# Patient Record
Sex: Male | Born: 1940 | Race: White | Hispanic: No | Marital: Married | Smoking: Never smoker
Health system: Southern US, Community
[De-identification: ages and names within clinical notes are randomized; demographics above are authoritative.]

## PROBLEM LIST (undated history)

## (undated) DIAGNOSIS — I639 Cerebral infarction, unspecified: Secondary | ICD-10-CM

## (undated) DIAGNOSIS — R1310 Dysphagia, unspecified: Secondary | ICD-10-CM

## (undated) DIAGNOSIS — F0390 Unspecified dementia without behavioral disturbance: Secondary | ICD-10-CM

## (undated) DIAGNOSIS — G20A1 Parkinson's disease without dyskinesia, without mention of fluctuations: Secondary | ICD-10-CM

## (undated) DIAGNOSIS — N390 Urinary tract infection, site not specified: Secondary | ICD-10-CM

## (undated) DIAGNOSIS — H9190 Unspecified hearing loss, unspecified ear: Secondary | ICD-10-CM

## (undated) DIAGNOSIS — T7840XA Allergy, unspecified, initial encounter: Secondary | ICD-10-CM

## (undated) HISTORY — PX: PARTIAL COLECTOMY: SHX5273

## (undated) HISTORY — PX: COCHLEAR IMPLANT: SUR684

---

## 2013-10-30 MED ORDER — IBUPROFEN 600 MG TAB
600 mg | ORAL_TABLET | Freq: Four times a day (QID) | ORAL | Status: AC | PRN
Start: 2013-10-30 — End: ?

## 2013-10-30 MED ORDER — HYDROCODONE-ACETAMINOPHEN 5 MG-325 MG TAB
5-325 mg | ORAL_TABLET | ORAL | Status: AC | PRN
Start: 2013-10-30 — End: ?

## 2013-10-30 MED ADMIN — HYDROcodone-acetaminophen (NORCO) 5-325 mg per tablet 1 Tab: ORAL | NDC 00591320205

## 2013-10-30 MED FILL — HYDROCODONE-ACETAMINOPHEN 5 MG-325 MG TAB: 5-325 mg | ORAL | Qty: 1

## 2013-10-30 NOTE — ED Notes (Signed)
Patient medicated for pain prior to xray. Resting with wife at bedside.

## 2013-10-30 NOTE — ED Notes (Signed)
Pt states that this am around 1130 he was cleaning windows and fell 4 ft. Hitting his left lower back and head.  States was slightly confused afterwards but recover quickly.  Pain is located in the left flank with redness and small abrasions.   Pt denies headache, nausea or blurred vision.

## 2013-10-30 NOTE — ED Notes (Signed)
Patient  given copy of dc instructions and 2 script(s).  Patient  verbalized understanding of instructions and script (s).  P  Patient alert and oriented and in no acute distress.  Patient discharged home ambulatory with wife.

## 2013-10-30 NOTE — ED Provider Notes (Signed)
HPI Comments: 73 yo WM presents after a fall. He was cleaning windows about 7 hours ago. He fell 4 feet onto dirt. He hit the back of his head. No LOC. No headache. No vomiting. Pt is ambulating normally. He hit the left lower back on the ground. Pt was complaining of left low back pain after the fall. Pain is throbbing in nature. Pain feels like muscle spasms. No radiation of pain. Twisting make the pain worse. No chest pain. No SOB. No abd pain. He denies headache now. No neck pain. He has several small scrapes since the fall, TD is UTD. Pt is on a baby aspirin, but no other blood thinners. No ETOH or tobacco use.    The history is provided by the patient and the spouse.        Past Medical History   Diagnosis Date   ??? Diverticulitis         History reviewed. No pertinent past surgical history.      History reviewed. No pertinent family history.     History     Social History   ??? Marital Status: MARRIED     Spouse Name: N/A     Number of Children: N/A   ??? Years of Education: N/A     Occupational History   ??? Not on file.     Social History Main Topics   ??? Smoking status: Never Smoker    ??? Smokeless tobacco: Not on file   ??? Alcohol Use: No   ??? Drug Use: Not on file   ??? Sexual Activity: Not on file     Other Topics Concern   ??? Not on file     Social History Narrative   ??? No narrative on file                  ALLERGIES: Pcn and Sulfa (sulfonamide antibiotics)      Review of Systems   Constitutional: Negative for fever and activity change.   Eyes: Negative for pain.   Respiratory: Negative for cough and shortness of breath.    Cardiovascular: Negative for chest pain and leg swelling.   Gastrointestinal: Negative for abdominal pain.   Endocrine: Negative for polyuria.   Genitourinary: Negative for hematuria and flank pain.   Musculoskeletal: Positive for back pain. Negative for gait problem, neck pain and neck stiffness.   Skin: Negative for color change.   Allergic/Immunologic: Negative for immunocompromised state.    Neurological: Negative for speech difficulty and headaches.   Hematological: Does not bruise/bleed easily.   Psychiatric/Behavioral: Negative for confusion.   All other systems reviewed and are negative.      Filed Vitals:    10/30/13 1915   BP: 142/84   Pulse: 89   Temp: 99.1 ??F (37.3 ??C)   Resp: 18   Height: 5\' 7"  (1.702 m)   Weight: 66.679 kg (147 lb)   SpO2: 96%            Physical Exam   Constitutional: He is oriented to person, place, and time. He appears well-developed and well-nourished. No distress.   Well appearing, nontoxic, NAD   HENT:   Head: Normocephalic and atraumatic.   Right Ear: External ear normal.   Left Ear: External ear normal.   Eyes: EOM are normal. Pupils are equal, round, and reactive to light.   Neck: Normal range of motion. Neck supple. No JVD present. No tracheal deviation present.   Cardiovascular: Normal rate, regular rhythm and  normal heart sounds.  Exam reveals no gallop and no friction rub.    No murmur heard.  Pulmonary/Chest: Effort normal and breath sounds normal. No stridor. No respiratory distress. He has no wheezes. He has no rales.   Abdominal: Soft. Bowel sounds are normal. He exhibits no distension. There is no tenderness. There is no rebound and no guarding.   Musculoskeletal: Normal range of motion. He exhibits tenderness. He exhibits no edema.   Mild pain in left lower lumbar region to palpation, no bruising or redness noted, no midline lumbar pain.   Neurological: He is alert and oriented to person, place, and time. He has normal reflexes. No cranial nerve deficit. Coordination normal.   Skin: Skin is warm and dry. No rash noted. He is not diaphoretic. No erythema.   Several small abrasions over right forearm and over bilateral lower legs, no lacerations   Psychiatric: He has a normal mood and affect. His behavior is normal. Judgment and thought content normal.   Nursing note and vitals reviewed.       MDM  Number of Diagnoses or Management Options  Diagnosis  management comments: Pt has mild pain in left lower lumbar region. Will give Norco. Will obtain xrays. No headache. He looks great otherwise. TD is UTD. DDx is sprain, contusion or fracture.       Amount and/or Complexity of Data Reviewed  Tests in the radiology section of CPT??: ordered and reviewed  Decide to obtain previous medical records or to obtain history from someone other than the patient: yes  Obtain history from someone other than the patient: yes (Wife)  Review and summarize past medical records: yes  Independent visualization of images, tracings, or specimens: yes    Risk of Complications, Morbidity, and/or Mortality  Presenting problems: moderate  Diagnostic procedures: moderate  Management options: moderate    Patient Progress  Patient progress: improved      Procedures    8:01 PM  Xrays of low back show no acute fracture    Home with Motrin, Norco, PCP follow up. Pt and his wife agree.    Apply ice to affected area 20 min on and 20 min off    Return for any worsening symptoms including chest pain, shortness of breath, vomiting or fevers. Drink plenty of fluids. Please call your primary doctor for follow up within the next 24 hours.

## 2015-08-20 ENCOUNTER — Ambulatory Visit: Admit: 2015-08-20 | Discharge: 2015-08-20 | Payer: MEDICARE | Attending: Nurse Practitioner | Primary: Family Medicine

## 2015-08-20 DIAGNOSIS — L814 Other melanin hyperpigmentation: Secondary | ICD-10-CM

## 2015-08-20 MED ORDER — FLUOROURACIL 5 % TOPICAL CREAM
5 % | Freq: Two times a day (BID) | CUTANEOUS | 0 refills | Status: AC
Start: 2015-08-20 — End: ?

## 2015-08-20 NOTE — Progress Notes (Signed)
Name: Terry Rios       Age: 75 y.o.       Date: 08/20/2015    Chief Complaint:   Chief Complaint   Patient presents with   ??? Skin Exam     New Patient- No concerns general preventative exam       Subjective:    HPI  [name] is a 75 y.o. male who presents as a new patient to [facility] for a skin exam.  The patient was referred for this evaluation by another patient. The patient has had a skin exam in the past and the patient does have current complaints related to his skin.  He reports scaling on his scalp. He reports cryotherapy in the past when he was living in [place]. For the last 2 years he has been seeing dermatology locally and did not have any treatments he states. He would like to transfer here.  [name] is feeling well and in his usual state of health today.    The patient's pertinent skin history includes: AK? - areas on the face, scalp treated with cryotherapy.    ROS: Constitutional: Negative.    Dermatological : positive for - skin lesion changes      Social History     Social History   ??? Marital status: UNKNOWN     Spouse name: N/A   ??? Number of children: N/A   ??? Years of education: N/A     Occupational History   ??? Not on file.     Social History Main Topics   ??? Smoking status: Never Smoker   ??? Smokeless tobacco: Never Used   ??? Alcohol use No   ??? Drug use: Not on file   ??? Sexual activity: Not on file     Other Topics Concern   ??? Not on file     Social History Narrative       No family history on file.    Past Medical History:   Diagnosis Date   ??? Diverticulitis    ??? Sun-damaged skin    ??? Sunburn, blistering        History reviewed. No pertinent surgical history.    Current Outpatient Prescriptions   Medication Sig Dispense Refill   ??? fluorouracil (EFUDEX) 5 % chemo cream Apply  to affected area two (2) times a day. 40 g 0   ??? aspirin 81 mg chewable tablet Take 81 mg by mouth daily.      ??? ibuprofen (MOTRIN) 600 mg tablet Take 1 Tab by mouth every six (6) hours as needed for Pain. 20 Tab 0   ??? HYDROcodone-acetaminophen (NORCO) 5-325 mg per tablet Take 1 Tab by mouth every four (4) hours as needed for Pain. Max Daily Amount: 6 Tabs. 15 Tab 0       Allergies   Allergen Reactions   ??? Pcn [Penicillins] Anaphylaxis   ??? Sulfa (Sulfonamide Antibiotics) Anaphylaxis         Objective:    Visit Vitals   ??? BP 138/82 (BP 1 Location: Left arm, BP Patient Position: Sitting)   ??? Pulse 78   ??? Temp 98.5 ??F (36.9 ??C) (Oral)   ??? Resp 19   ??? Ht  (1.702 m)   ??? Wt 66.7 kg (147 lb)   ??? SpO2 97%   ??? BMI 23.02 kg/m2       Terry Rios is a 75 y.o. male who  appears well and in no distress.  He is awake, alert, and oriented.  There is no preauricular, submandibular, or cervical lymphadenopathy.  A skin examination was performed including his scalp, face (including eyelid), ears, neck, chest, back, abdomen, upper extremities (including digits/nails), lower extremities, breasts, buttocks; genital skin was not examined.  He is heavily freckled. There are diffuse small thin scaled AKs and dry skin on the scalp, forehead and temples. There are scattered seborrheic keratoses. He has pink intradermal and pink and brown intradermal nevi without concerning features. There are scattered cherry angiomas. He has a few excoriations on the left anterior shin.        Assessment/Plan:  1. Solar lentigos.  The diagnosis and relationship to sun exposure was reviewed.  Sun protection advised.  2. Normal nevi.  The diagnosis of normal nevi was reviewed.  I discussed sun protection, sunscreen use, the warning signs of skin cancer, the need for self-skin examinations, and the need for regular practitioner exams every 1 year.  The patient should follow up sooner as needed if new, changing, or symptomatic skin lesions arise.  3. Seborrheic keratoses.  The diagnosis was reviewed and the patient was  reassured that no treatment is needed for these benign lesions.  4. Cherry angiomas.  The diagnosis was reviewed and the patient was reassured that no treatment is needed for these benign lesions.  5. Actinic Keratoses.  The diagnosis of this precancerous lesion related to sun exposure was reviewed.  I suggest 5-Fu vs PDT for his scalp. We discussed both options. He would like to do 5-Fu. We discussed use of the medication and side effects. Handouts provided.

## 2015-11-18 ENCOUNTER — Encounter: Attending: Nurse Practitioner | Primary: Family Medicine

## 2019-07-18 ENCOUNTER — Ambulatory Visit: Payer: Medicare Other | Attending: Internal Medicine

## 2019-07-18 DIAGNOSIS — Z23 Encounter for immunization: Secondary | ICD-10-CM | POA: Insufficient documentation

## 2019-07-18 NOTE — Progress Notes (Signed)
   Covid-19 Vaccination Clinic  Name:  Justin Bird    MRN: [id number] DOB: [birth year]  07/18/2019  Justin Bird was observed post Covid-19 immunization for 15 minutes without incidence. He was provided with Vaccine Information Sheet and instruction to access the V-Safe system.   Justin Bird was instructed to call 911 with any severe reactions post vaccine: Marland Kitchen Difficulty breathing  . Swelling of your face and throat  . A fast heartbeat  . A bad rash all over your body  . Dizziness and weakness    Immunizations Administered    Name Date Dose VIS Date Route   Pfizer COVID-19 Vaccine 07/18/2019 12:50 PM 0.3 mL 06/07/2019 Intramuscular   Manufacturer: [place]   Lot: [id number]   [place]: [id number]

## 2019-08-06 ENCOUNTER — Ambulatory Visit: Payer: Medicare Other | Attending: Internal Medicine

## 2019-08-06 DIAGNOSIS — Z23 Encounter for immunization: Secondary | ICD-10-CM | POA: Insufficient documentation

## 2019-08-06 NOTE — Progress Notes (Signed)
   Covid-19 Vaccination Clinic  Name:  Justin Bird    MRN: [id number] DOB: October 21, 1940  08/06/2019  Justin Bird was observed post Covid-19 immunization for 30 minutes based on pre-vaccination screening without incidence. He was provided with Vaccine Information Sheet and instruction to access the V-Safe system.   Justin Bird was instructed to call 911 with any severe reactions post vaccine: Marland Kitchen Difficulty breathing  . Swelling of your face and throat  . A fast heartbeat  . A bad rash all over your body  . Dizziness and weakness    Immunizations Administered    Name Date Dose VIS Date Route   Pfizer COVID-19 Vaccine 08/06/2019 12:33 PM 0.3 mL 06/07/2019 Intramuscular   Manufacturer: [place]   Lot: [id number]   [place]: [id number]

## 2019-10-29 ENCOUNTER — Other Ambulatory Visit: Payer: Self-pay

## 2019-10-29 ENCOUNTER — Ambulatory Visit
Admission: EM | Admit: 2019-10-29 | Discharge: 2019-10-29 | Disposition: A | Discharge status: Home / Self Care | Payer: Medicare Other

## 2019-10-29 DIAGNOSIS — H9201 Otalgia, right ear: Secondary | ICD-10-CM | POA: Diagnosis not present

## 2019-10-29 DIAGNOSIS — T161XXA Foreign body in right ear, initial encounter: Secondary | ICD-10-CM

## 2019-10-29 DIAGNOSIS — R030 Elevated blood-pressure reading, without diagnosis of hypertension: Secondary | ICD-10-CM

## 2019-10-29 HISTORY — DX: Unspecified hearing loss, unspecified ear: H91.90

## 2019-10-29 NOTE — ED Triage Notes (Signed)
Pt presents with c/o possible foreign body in his right ear. Pt reports he went to remove his hearing aid last night and the rubber tip of the hearing aid did not come out. Pt thinks it may be stuck in his ear canal. Pt denies any pain in the ear. Pt states this has happened previously and he was able to remove the piece, but has not attempted to this time for fear of injuring his ear.

## 2019-10-29 NOTE — ED Provider Notes (Signed)
Roderic Palau    CSN: [id number] Arrival date & time: 10/29/19  0831      History   Chief Complaint Chief Complaint  Patient presents with  . Ear Problem    poss foreign body, right    HPI Justin Bird is a 79 y.o. male.   Accompanied by his wife, patient presents with a foreign body in his right ear canal since yesterday evening.  He states part of his hearing aid broke off into his ear while he was removing it yesterday evening.  He states it is uncomfortable but not painful.  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    The history is provided by the patient and the spouse.    Past Medical History:  Diagnosis Date  . Hearing deficit     There are no problems to display for this patient.   Past Surgical History:  Procedure Laterality Date  . COCHLEAR IMPLANT Left   . PARTIAL COLECTOMY         Home Medications    Prior to Admission medications   Medication Sig Start Date End Date Taking? Authorizing Provider  DIPHENHYDRAMINE HCL PO Take by mouth.    [provider]  GLUCOSAMINE SULFATE PO Take by mouth.    [provider]  Multiple Vitamins-Minerals (VITAMIN D3 COMPLETE PO) Take by mouth.    [provider]    Family History History reviewed. No pertinent family history.  Social History Social History   Tobacco Use  . Smoking status: Never Smoker  . Smokeless tobacco: Never Used  Substance Use Topics  . Alcohol use: Not Currently  . Drug use: Never     Allergies   Opium poppy [papaver], Penicillins, and Sulfa antibiotics   Review of Systems Review of Systems  Constitutional: Negative for chills and fever.  HENT: Positive for hearing loss. Negative for ear pain and sore throat.   Eyes: Negative for pain and visual disturbance.  Respiratory: Negative for cough and shortness of breath.   Cardiovascular: Negative for chest pain and palpitations.  Gastrointestinal: Negative for  abdominal pain and vomiting.  Genitourinary: Negative for dysuria and hematuria.  Musculoskeletal: Negative for arthralgias and back pain.  Skin: Negative for color change and rash.  Neurological: Negative for seizures and syncope.  All other systems reviewed and are negative.    Physical Exam Triage Vital Signs ED Triage Vitals [10/29/19 0838]  Enc Vitals Group     BP      Pulse      Resp      Temp      Temp src      SpO2      Weight 170 lb (77.1 kg)     Height 5\' 6"  (1.676 m)     Head Circumference      Peak Flow      Pain Score 0     Pain Loc      Pain Edu?      Excl. in [place]?    No data found.  Updated Vital Signs BP (!) 151/87 (BP Location: Right Arm)   Pulse 81   Temp 98.2 F (36.8 C) (Oral)   Ht 5\' 6"  (1.676 m)   Wt 170 lb (77.1 kg)   SpO2 96%   BMI 27.44 kg/m   Visual Acuity Right Eye Distance:   Left Eye Distance:   Bilateral Distance:    Right Eye Near:   Left Eye  Near:    Bilateral Near:     Physical Exam Vitals and nursing note reviewed.  Constitutional:      Appearance: He is well-developed.  HENT:     Head: Normocephalic and atraumatic.     Right Ear: A foreign body is present.     Nose: Nose normal.     Mouth/Throat:     Mouth: Mucous membranes are moist.     Pharynx: Oropharynx is clear.  Eyes:     Conjunctiva/sclera: Conjunctivae normal.  Cardiovascular:     Rate and Rhythm: Normal rate and regular rhythm.     Heart sounds: No murmur.  Pulmonary:     Effort: Pulmonary effort is normal. No respiratory distress.     Breath sounds: Normal breath sounds.  Abdominal:     Palpations: Abdomen is soft.     Tenderness: There is no abdominal tenderness. There is no guarding or rebound.  Musculoskeletal:     Cervical back: Neck supple.  Skin:    General: Skin is warm and dry.     Findings: No rash.  Neurological:     General: No focal deficit present.     Mental Status: He is alert and oriented to person, place, and time.     Gait:  Gait normal.  Psychiatric:        Mood and Affect: Mood normal.        Behavior: Behavior normal.      UC Treatments / Results  Labs (all labs ordered are listed, but only abnormal results are displayed) Labs Reviewed - No data to display  EKG   Radiology No results found.  Procedures Foreign Body Removal  Date/Time: 10/29/2019 9:51 AM Performed by: Sharion Balloon, NP Authorized by: Sharion Balloon, NP   Consent:    Consent obtained:  Verbal   Consent given by:  Patient   Risks discussed:  Bleeding, pain and incomplete removal Location:    Location:  Ear   Ear location:  R ear Anesthesia (see MAR for exact dosages):    Anesthesia method:  None Procedure details:    Intact foreign body removal: yes   Post-procedure details:    Confirmation:  No additional foreign bodies on visualization   Patient tolerance of procedure:  Tolerated well, no immediate complications Comments:     Rubber tip of hearing aid removed intact from right ear canal.     (including critical care time)  Medications Ordered in UC Medications - No data to display  Initial Impression / Assessment and Plan / UC Course  I have reviewed the triage vital signs and the nursing notes.  Pertinent labs & imaging results that were available during my care of the patient were reviewed by me and considered in my medical decision making (see chart for details).   Foreign body of right ear.  Elevated blood pressure.  Hearing aid tip removed from right ear canal.  Instructed patient to follow-up with his PCP as needed.  Discussed with patient that his blood pressure is elevated today and this needs to be rechecked by his PCP in 2 to 4 weeks.  Patient and his wife agree to plan of care.   Final Clinical Impressions(s) / UC Diagnoses   Final diagnoses:  Foreign body of right ear, initial encounter  Elevated blood pressure reading     Discharge Instructions     The foreign body (piece of hearing aid) was  removed from your ear.  Follow up with your  PCP as needed.    Your blood pressure is elevated today at 151/87.  Please have this rechecked by your primary care provider in 2-4 weeks.         ED Prescriptions    None     PDMP not reviewed this encounter.   Sharion Balloon, NP 10/29/19 [phone]

## 2019-10-29 NOTE — Discharge Instructions (Addendum)
The foreign body (piece of hearing aid) was removed from your ear.  Follow up with your PCP as needed.    Your blood pressure is elevated today at 151/87.  Please have this rechecked by your primary care provider in 2-4 weeks.

## 2019-12-25 ENCOUNTER — Ambulatory Visit: Payer: Medicare Other | Admitting: Dermatology

## 2019-12-25 ENCOUNTER — Other Ambulatory Visit: Payer: Self-pay

## 2019-12-25 DIAGNOSIS — D229 Melanocytic nevi, unspecified: Secondary | ICD-10-CM

## 2019-12-25 DIAGNOSIS — D1800 Hemangioma unspecified site: Secondary | ICD-10-CM

## 2019-12-25 DIAGNOSIS — L814 Other melanin hyperpigmentation: Secondary | ICD-10-CM | POA: Diagnosis not present

## 2019-12-25 DIAGNOSIS — Z1283 Encounter for screening for malignant neoplasm of skin: Secondary | ICD-10-CM | POA: Diagnosis not present

## 2019-12-25 DIAGNOSIS — L821 Other seborrheic keratosis: Secondary | ICD-10-CM

## 2019-12-25 DIAGNOSIS — L570 Actinic keratosis: Secondary | ICD-10-CM | POA: Diagnosis not present

## 2019-12-25 DIAGNOSIS — L578 Other skin changes due to chronic exposure to nonionizing radiation: Secondary | ICD-10-CM

## 2019-12-25 NOTE — Progress Notes (Signed)
   New Patient Visit  Subjective  Justin Bird is a 79 y.o. male who presents for the following: Annual Exam (patient recently moved to this area from [place] and would like to establish care. He has no known history of skin cancers or irregular nevi, but he has had a history of AK's). The patient presents for Total-Body Skin Exam (TBSE) for skin cancer screening and mole check.  The following portions of the chart were reviewed this encounter and updated as appropriate:  Tobacco  Allergies  Meds  Problems  Med Hx  Surg Hx  Fam Hx     Review of Systems:  No other skin or systemic complaints except as noted in HPI or Assessment and Plan.  Objective  Well appearing patient in no apparent distress; mood and affect are within normal limits.  A full examination was performed including scalp, head, eyes, ears, nose, lips, neck, chest, axillae, abdomen, back, buttocks, bilateral upper extremities, bilateral lower extremities, hands, feet, fingers, toes, fingernails, and toenails. All findings within normal limits unless otherwise noted below.  Objective  scalp, face, and ears (15): Erythematous thin papules/macules with gritty scale.   Assessment & Plan    AK (actinic keratosis) (15) scalp, face, and ears  Consider PDT for the scalp at follow up appointment. Pamphlet given.   Destruction of lesion - scalp, face, and ears Complexity: simple   Destruction method: cryotherapy   Informed consent: discussed and consent obtained   Timeout:  patient name, date of birth, surgical site, and procedure verified Lesion destroyed using liquid nitrogen: Yes   Region frozen until ice ball extended beyond lesion: Yes   Outcome: patient tolerated procedure well with no complications   Post-procedure details: wound care instructions given    Skin cancer screening   Lentigines - Scattered tan macules - Discussed due to sun exposure - Benign, observe - Call for any changes  Seborrheic  Keratoses - Stuck-on, waxy, tan-brown papules and plaques  - Discussed benign etiology and prognosis. - Observe - Call for any changes  Melanocytic Nevi - Tan-brown and/or pink-flesh-colored symmetric macules and papules - Benign appearing on exam today - Observation - Call clinic for new or changing moles - Recommend daily use of broad spectrum spf 30+ sunscreen to sun-exposed areas.   Hemangiomas - Red papules - Discussed benign nature - Observe - Call for any changes  Actinic Damage - diffuse scaly erythematous macules with underlying dyspigmentation - Recommend daily broad spectrum sunscreen SPF 30+ to sun-exposed areas, reapply every 2 hours as needed.  - Call for new or changing lesions.  Skin cancer screening performed today.   Return in about 3 months (around 03/26/2020) for AK recheck .  Luther Redo, CMA, am acting as scribe for Sarina Ser, MD .  Documentation: I have reviewed the above documentation for accuracy and completeness, and I agree with the above.  Sarina Ser, MD

## 2020-01-06 ENCOUNTER — Encounter: Payer: Self-pay | Admitting: Dermatology

## 2020-03-26 ENCOUNTER — Ambulatory Visit: Payer: Medicare Other | Admitting: Dermatology

## 2020-06-01 ENCOUNTER — Other Ambulatory Visit: Payer: Self-pay | Admitting: Student

## 2020-06-01 DIAGNOSIS — M47816 Spondylosis without myelopathy or radiculopathy, lumbar region: Secondary | ICD-10-CM

## 2020-06-09 ENCOUNTER — Ambulatory Visit: Payer: Medicare Other | Admitting: Dermatology

## 2020-06-11 ENCOUNTER — Ambulatory Visit
Admission: RE | Admit: 2020-06-11 | Discharge: 2020-06-11 | Disposition: A | Discharge status: Home / Self Care | Payer: Medicare Other | Source: Ambulatory Visit | Attending: Student | Admitting: Student

## 2020-06-11 ENCOUNTER — Other Ambulatory Visit: Payer: Self-pay

## 2020-06-11 DIAGNOSIS — M47816 Spondylosis without myelopathy or radiculopathy, lumbar region: Secondary | ICD-10-CM

## 2020-06-12 ENCOUNTER — Other Ambulatory Visit: Payer: Self-pay | Admitting: Student

## 2020-06-12 DIAGNOSIS — M47816 Spondylosis without myelopathy or radiculopathy, lumbar region: Secondary | ICD-10-CM

## 2020-06-22 ENCOUNTER — Other Ambulatory Visit: Payer: Self-pay

## 2020-06-22 ENCOUNTER — Ambulatory Visit
Admission: RE | Admit: 2020-06-22 | Discharge: 2020-06-22 | Disposition: A | Discharge status: Home / Self Care | Payer: Medicare Other | Source: Ambulatory Visit | Attending: Student | Admitting: Student

## 2020-06-22 DIAGNOSIS — M47816 Spondylosis without myelopathy or radiculopathy, lumbar region: Secondary | ICD-10-CM

## 2021-04-26 ENCOUNTER — Other Ambulatory Visit: Payer: Self-pay

## 2021-04-26 ENCOUNTER — Ambulatory Visit: Payer: Medicare Other | Admitting: Dermatology

## 2021-04-26 DIAGNOSIS — B078 Other viral warts: Secondary | ICD-10-CM

## 2021-04-26 DIAGNOSIS — L570 Actinic keratosis: Secondary | ICD-10-CM | POA: Diagnosis not present

## 2021-04-26 DIAGNOSIS — L820 Inflamed seborrheic keratosis: Secondary | ICD-10-CM | POA: Diagnosis not present

## 2021-04-26 DIAGNOSIS — D692 Other nonthrombocytopenic purpura: Secondary | ICD-10-CM | POA: Diagnosis not present

## 2021-04-26 DIAGNOSIS — L578 Other skin changes due to chronic exposure to nonionizing radiation: Secondary | ICD-10-CM | POA: Diagnosis not present

## 2021-04-26 NOTE — Patient Instructions (Addendum)

## 2021-04-26 NOTE — Progress Notes (Signed)
Follow-Up Visit   Subjective  Justin Bird is a 80 y.o. male who presents for the following: Actinic Keratosis (4 months f/u hx of precancers on his face). Check several rough places on his face and scalp today.  The following portions of the chart were reviewed this encounter and updated as appropriate:   Tobacco  Allergies  Meds  Problems  Med Hx  Surg Hx  Fam Hx     Review of Systems:  No other skin or systemic complaints except as noted in HPI or Assessment and Plan.  Objective  Well appearing patient in no apparent distress; mood and affect are within normal limits.  A focused examination was performed including face,scalp. Relevant physical exam findings are noted in the Assessment and Plan.  face,scalp  (17) (17) Erythematous thin papules/macules with gritty scale.   right temple  (1) Erythematous keratotic or waxy stuck-on papule or plaque.   left index finger (1) Verrucous papules -- Discussed viral etiology and contagion.    Assessment & Plan  AK (actinic keratosis) (17) face,scalp  (17)  Destruction of lesion - face,scalp  (17) Complexity: simple   Destruction method: cryotherapy   Informed consent: discussed and consent obtained   Timeout:  patient name, date of birth, surgical site, and procedure verified Lesion destroyed using liquid nitrogen: Yes   Region frozen until ice ball extended beyond lesion: Yes   Outcome: patient tolerated procedure well with no complications   Post-procedure details: wound care instructions given    Inflamed seborrheic keratosis right temple  (1)  Reassured benign age-related growth.  Recommend observation.  Discussed cryotherapy if spot(s) become irritated or inflamed.   Destruction of lesion - right temple  (1) Complexity: simple   Destruction method: cryotherapy   Informed consent: discussed and consent obtained   Timeout:  patient name, date of birth, surgical site, and procedure verified Lesion destroyed  using liquid nitrogen: Yes   Region frozen until ice ball extended beyond lesion: Yes   Outcome: patient tolerated procedure well with no complications   Post-procedure details: wound care instructions given    Other viral warts left index finger (1)  Discussed viral etiology and risk of spread.  Discussed multiple treatments may be required to clear warts.  Discussed possible post-treatment dyspigmentation and risk of recurrence.   Destruction of lesion - left index finger (1) Complexity: simple   Destruction method: cryotherapy   Informed consent: discussed and consent obtained   Timeout:  patient name, date of birth, surgical site, and procedure verified Lesion destroyed using liquid nitrogen: Yes   Region frozen until ice ball extended beyond lesion: Yes   Outcome: patient tolerated procedure well with no complications   Post-procedure details: wound care instructions given    Actinic Damage - Severe, confluent actinic changes with pre-cancerous actinic keratoses  - Severe, chronic, not at goal, secondary to cumulative UV radiation exposure over time - diffuse scaly erythematous macules and papules with underlying dyspigmentation - Discussed Prescription "Field Treatment" for Severe, Chronic Confluent Actinic Changes with Pre-Cancerous Actinic Keratoses  Plan field treatment at next visit.  Field treatment involves treatment of an entire area of skin that has confluent Actinic Changes (Sun/ Ultraviolet light damage) and PreCancerous Actinic Keratoses by method of PhotoDynamic Therapy (PDT) and/or prescription Topical Chemotherapy agents such as 5-fluorouracil, 5-fluorouracil/calcipotriene, and/or imiquimod.  The purpose is to decrease the number of clinically evident and subclinical PreCancerous lesions to prevent progression to development of skin cancer by chemically destroying early precancer  changes that may or may not be visible.  It has been shown to reduce the risk of developing  skin cancer in the treated area. As a result of treatment, redness, scaling, crusting, and open sores may occur during treatment course. One or more than one of these methods may be used and may have to be used several times to control, suppress and eliminate the PreCancerous changes. Discussed treatment course, expected reaction, and possible side effects. - Recommend daily broad spectrum sunscreen SPF 30+ to sun-exposed areas, reapply every 2 hours as needed.  - Staying in the shade or wearing long sleeves, sun glasses (UVA+UVB protection) and wide brim hats (4-inch brim around the entire circumference of the hat) are also recommended. - Call for new or changing lesions.   Purpura - Chronic; persistent and recurrent.  Treatable, but not curable. - Violaceous macules and patches - Benign - Related to trauma, age, sun damage and/or use of blood thinners, chronic use of topical and/or oral steroids - Observe - Can use OTC arnica containing moisturizer such as Dermend Bruise Formula if desired - Call for worsening or other concerns   Return in about 5 months (around 09/24/2021) for Aks .  IMarye Round, CMA, am acting as scribe for Sarina Ser, MD .  Documentation: I have reviewed the above documentation for accuracy and completeness, and I agree with the above.  Sarina Ser, MD

## 2021-04-27 ENCOUNTER — Encounter: Payer: Self-pay | Admitting: Dermatology

## 2021-06-30 IMAGING — CT CT L SPINE W/O CM
3 of 4 series · 12 of 33 positions shown, 14 images · non-contrast
Comparison: None.

CLINICAL DATA: Low back pain.  No injury

EXAM:
CT LUMBAR SPINE WITHOUT CONTRAST
TECHNIQUE: Multidetector CT imaging of the lumbar spine was performed without
intravenous contrast administration. Multiplanar CT image
reconstructions were also generated.

[Series 4: l spine soft · axial · 0.28mm/px · z∈[-354,-214]mm · 4 of 102 slices shown, 5 images]
[im 16/102  soft-tissue]
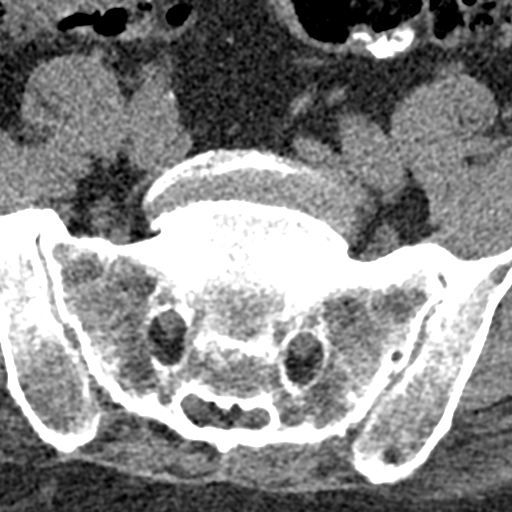
[im 16/102  bone]
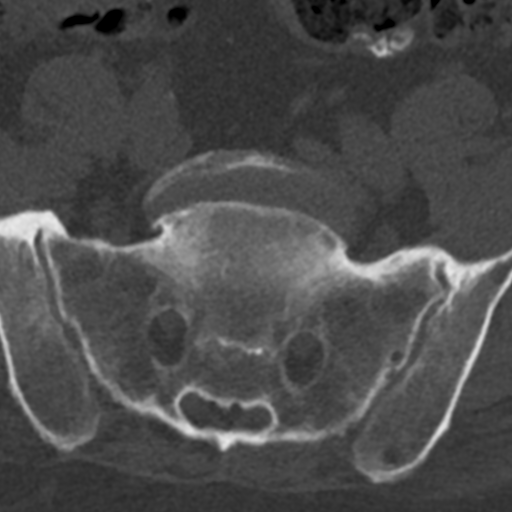
[im 39/102  bone]
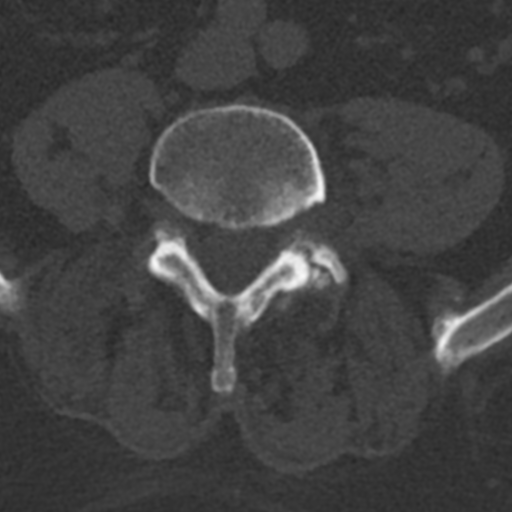
[im 63/102  bone]
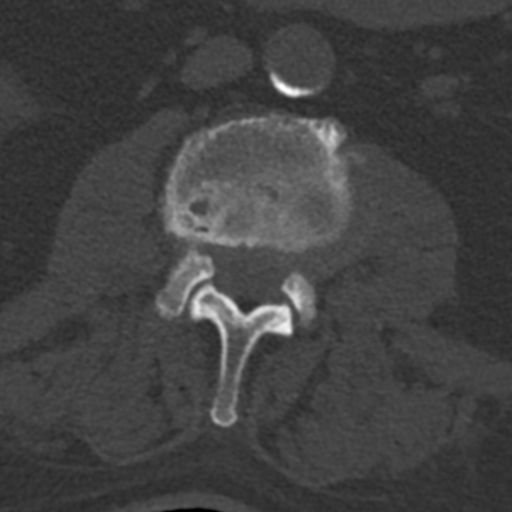
[im 86/102  bone]
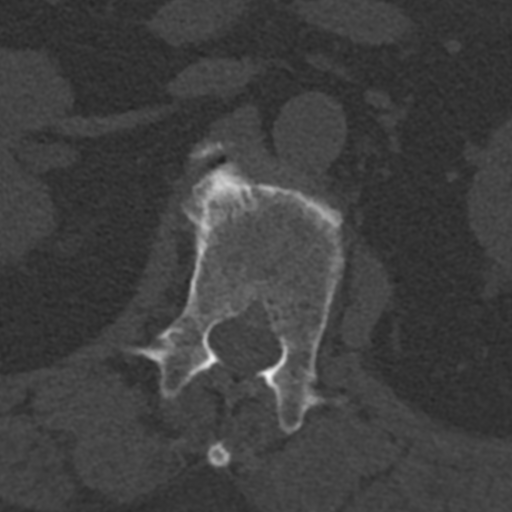

[Series 7: sagittal bone · sagittal · 0.30mm/px · 5 of 61 slices shown, 6 images]
[im 21/61  bone]
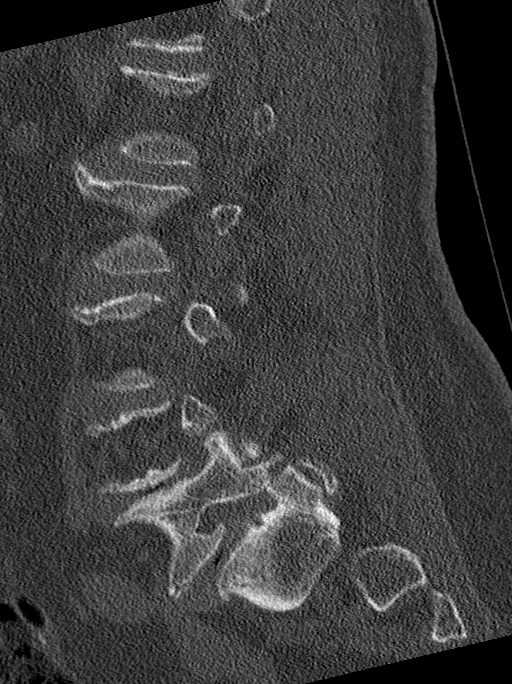
[im 26/61  bone]
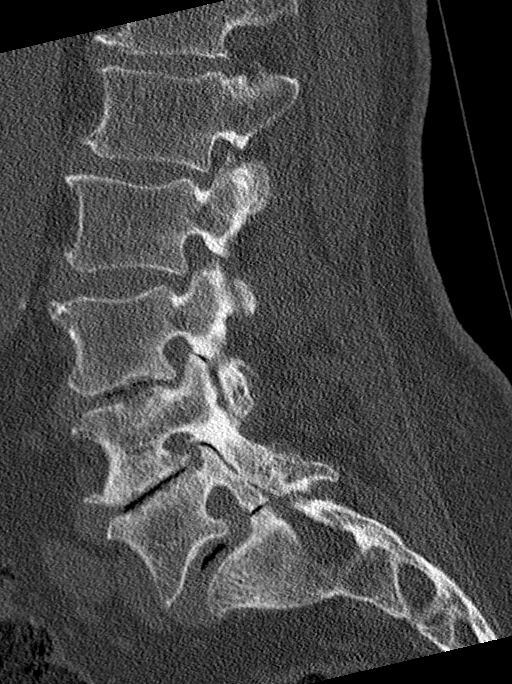
[im 31/61  soft-tissue]
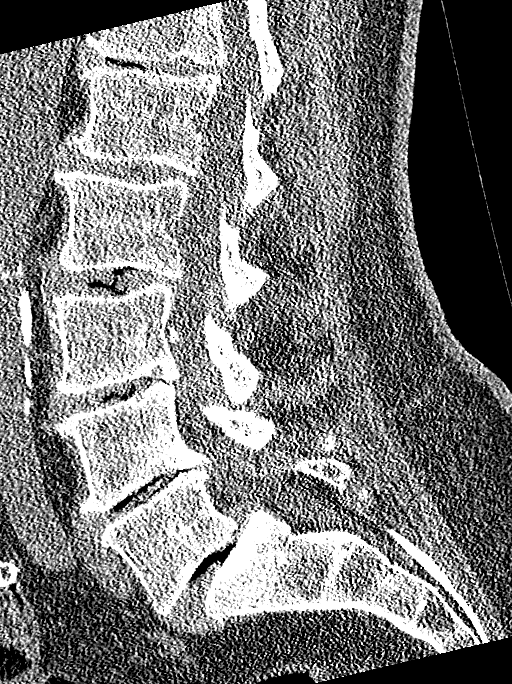
[im 31/61  bone]
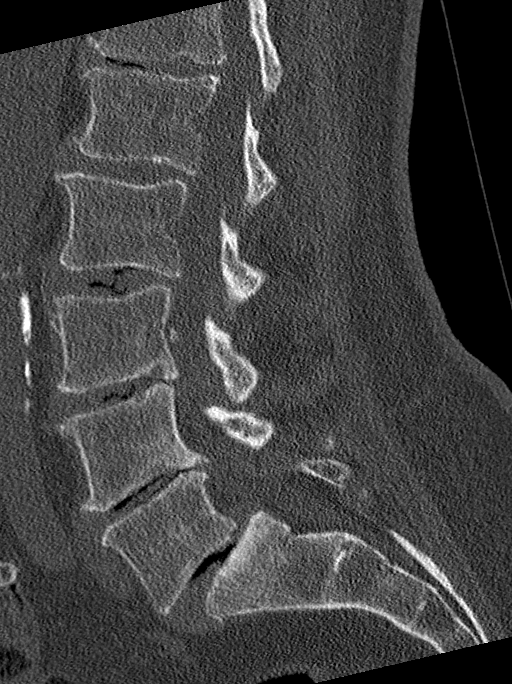
[im 36/61  bone]
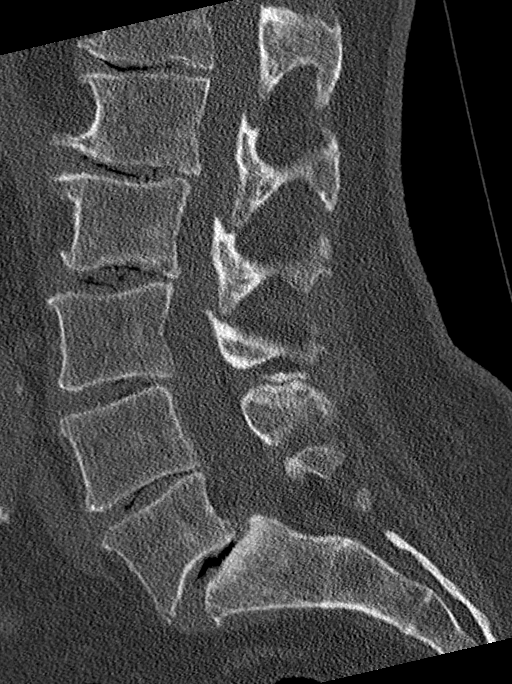
[im 41/61  bone]
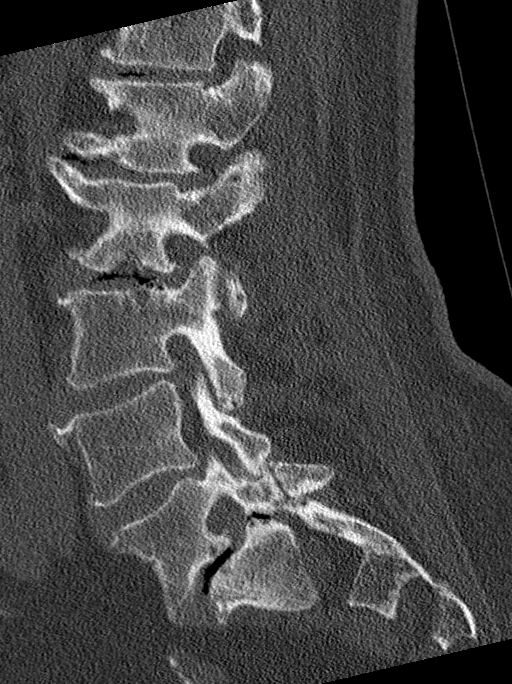

[Series 8: coronal bone · coronal · 0.23mm/px · 3 of 77 slices shown]
[im 20/77  bone]
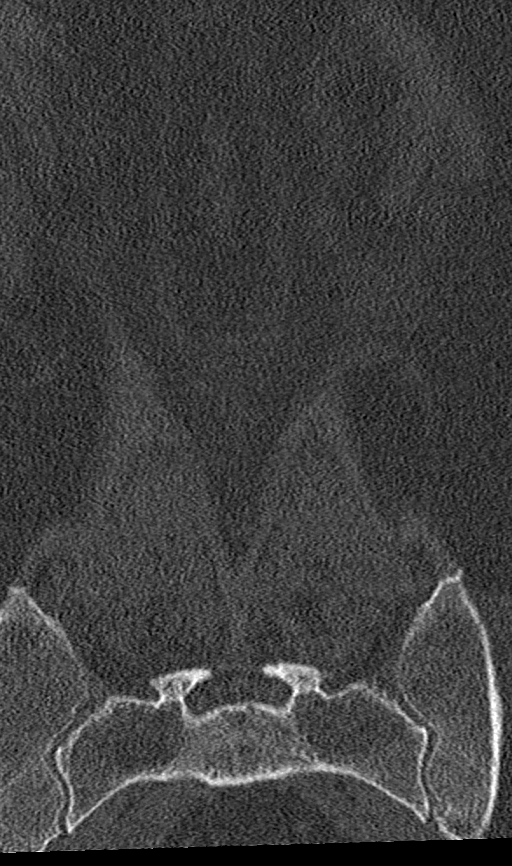
[im 39/77  bone]
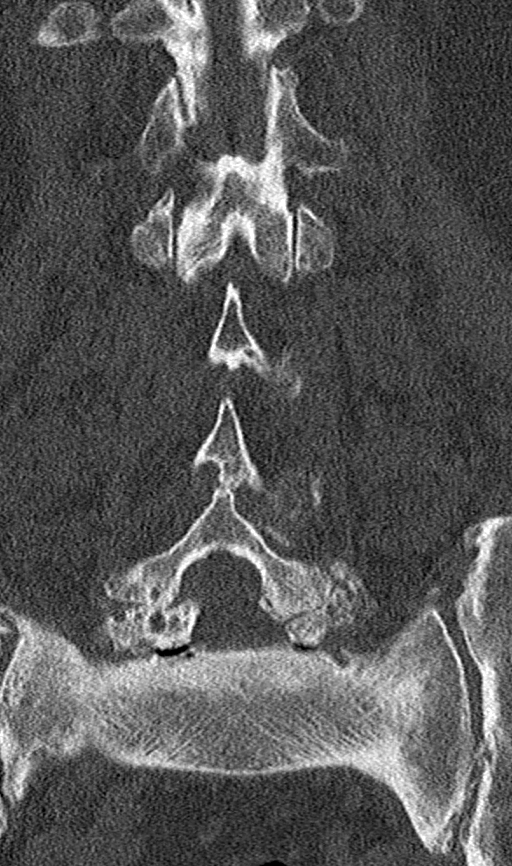
[im 58/77  bone]
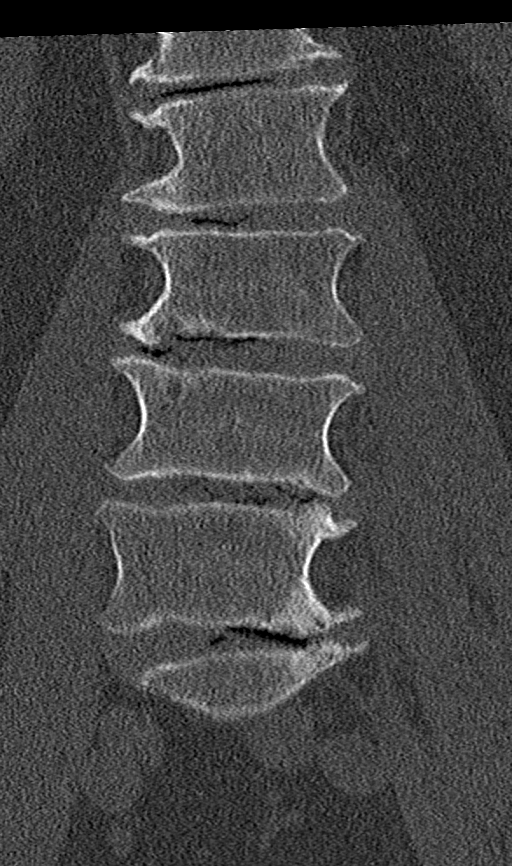

[12 of 33 positions shown; findings below may reference images not displayed]

FINDINGS: Segmentation: Normal

Alignment: Mild lumbar scoliosis. Slight retrolisthesis L1-2, L2-3,
L3-4. 6 mm anterolisthesis L5-S1.

Vertebrae: Chronic bilateral pars defects of L5. No vertebral body
fracture or mass.

Paraspinal and other soft tissues: Negative for paraspinous mass or
adenopathy. Mild atherosclerotic calcification abdominal aorta
without aneurysm.

Disc levels: T12-L1: Mild disc bulging and facet degeneration.
Negative for stenosis

L1-2: Disc degeneration with disc bulging and diffuse endplate
spurring. Mild facet degeneration. Mild to moderate subarticular
stenosis bilaterally due to spurring

L2-3: Diffuse disc bulging and endplate spurring. Mild disc bulging.
Mild spinal stenosis. Mild to moderate subarticular stenosis
bilaterally

L3-4: Disc degeneration with diffuse disc bulging and mild endplate
spurring. Mild subarticular stenosis bilaterally.

L4-5: Moderate disc degeneration with disc space narrowing and
endplate spurring, asymmetric to the left. Moderate facet
degeneration bilaterally. Moderate subarticular and foraminal
stenosis on the left. Mild subarticular stenosis on the right

L5-S1: Chronic bilateral pars defects of L5. Mild foraminal
narrowing bilaterally. Disc degeneration with disc space narrowing
and disc bulging.
IMPRESSION: Multilevel disc and facet degeneration throughout the lumbar spine.
Spinal and foraminal stenosis as described above

6 mm anterolisthesis L5-S1 with chronic bilateral pars defects of
L5.

## 2021-09-20 ENCOUNTER — Ambulatory Visit: Payer: Medicare Other | Admitting: Student in an Organized Health Care Education/Training Program

## 2021-09-27 ENCOUNTER — Ambulatory Visit: Payer: Medicare Other | Admitting: Dermatology

## 2023-01-25 ENCOUNTER — Other Ambulatory Visit: Payer: Self-pay | Admitting: Physical Medicine & Rehabilitation

## 2023-01-25 DIAGNOSIS — G8929 Other chronic pain: Secondary | ICD-10-CM

## 2023-02-02 ENCOUNTER — Ambulatory Visit
Admission: RE | Admit: 2023-02-02 | Discharge: 2023-02-02 | Disposition: A | Discharge status: Home / Self Care | Payer: Medicare Other | Source: Ambulatory Visit | Attending: Physical Medicine & Rehabilitation | Admitting: Physical Medicine & Rehabilitation

## 2023-02-02 DIAGNOSIS — G8929 Other chronic pain: Secondary | ICD-10-CM

## 2023-03-26 ENCOUNTER — Other Ambulatory Visit: Payer: Self-pay

## 2023-03-26 ENCOUNTER — Emergency Department: Payer: Medicare Other

## 2023-03-26 ENCOUNTER — Emergency Department
Admission: EM | Admit: 2023-03-26 | Discharge: 2023-03-26 | Disposition: A | Discharge status: Home / Self Care | Payer: Medicare Other | Attending: Emergency Medicine | Admitting: Emergency Medicine

## 2023-03-26 DIAGNOSIS — G8929 Other chronic pain: Secondary | ICD-10-CM | POA: Diagnosis not present

## 2023-03-26 DIAGNOSIS — M545 Low back pain, unspecified: Secondary | ICD-10-CM | POA: Diagnosis present

## 2023-03-26 LAB — COMPREHENSIVE METABOLIC PANEL
ALT: 19 U/L (ref 0–44)
AST: 19 U/L (ref 15–41)
Albumin: 4.4 g/dL (ref 3.5–5.0)
Alkaline Phosphatase: 51 U/L (ref 38–126)
Anion gap: 9 (ref 5–15)
BUN: 12 mg/dL (ref 8–23)
CO2: 26 mmol/L (ref 22–32)
Calcium: 9.1 mg/dL (ref 8.9–10.3)
Chloride: 104 mmol/L (ref 98–111)
Creatinine, Ser: 0.55 mg/dL — ABNORMAL LOW (ref 0.61–1.24)
GFR, Estimated: >60 mL/min (ref >60)
Glucose, Bld: 102 mg/dL — ABNORMAL HIGH (ref 70–99)
Potassium: 3.8 mmol/L (ref 3.5–5.1)
Sodium: 139 mmol/L (ref 135–145)
Total Bilirubin: 0.9 mg/dL (ref 0.3–1.2)
Total Protein: 7.9 g/dL (ref 6.5–8.1)

## 2023-03-26 LAB — CBC WITH DIFFERENTIAL/PLATELET
Abs Immature Granulocytes: 0.02 10*3/uL (ref 0.00–0.07)
Basophils Absolute: 0 10*3/uL (ref 0.0–0.1)
Basophils Relative: 0 %
Eosinophils Absolute: 0.1 10*3/uL (ref 0.0–0.5)
Eosinophils Relative: 1 %
HCT: 47.2 % (ref 39.0–52.0)
Hemoglobin: 15.9 g/dL (ref 13.0–17.0)
Immature Granulocytes: 0 %
Lymphocytes Relative: 18 %
Lymphs Abs: 1.4 10*3/uL (ref 0.7–4.0)
MCH: 29.8 pg (ref 26.0–34.0)
MCHC: 33.7 g/dL (ref 30.0–36.0)
MCV: 88.6 fL (ref 80.0–100.0)
Monocytes Absolute: 0.5 10*3/uL (ref 0.1–1.0)
Monocytes Relative: 7 %
Neutro Abs: 5.6 10*3/uL (ref 1.7–7.7)
Neutrophils Relative %: 74 %
Platelets: 267 10*3/uL (ref 150–400)
RBC: 5.33 MIL/uL (ref 4.22–5.81)
RDW: 12.3 % (ref 11.5–15.5)
WBC: 7.6 10*3/uL (ref 4.0–10.5)
nRBC: 0 % (ref 0.0–0.2)

## 2023-03-26 LAB — URINALYSIS, ROUTINE W REFLEX MICROSCOPIC
Bilirubin Urine: NEGATIVE
Glucose, UA: NEGATIVE mg/dL
Hgb urine dipstick: NEGATIVE
Ketones, ur: NEGATIVE mg/dL
Leukocytes,Ua: NEGATIVE
Nitrite: NEGATIVE
Protein, ur: NEGATIVE mg/dL
Specific Gravity, Urine: 1.009 (ref 1.005–1.030)
pH: 8 (ref 5.0–8.0)

## 2023-03-26 LAB — AMMONIA: Ammonia: 16 umol/L (ref 9–35)

## 2023-03-26 MED ORDER — OXYCODONE HCL 5 MG PO TABS
5.0000 mg | ORAL_TABLET | Freq: Three times a day (TID) | ORAL | 0 refills | Status: DC | PRN
Start: 2023-03-26 — End: 2023-05-11

## 2023-03-26 NOTE — ED Notes (Signed)
Pt A&O x4, no obvious distress noted, respirations regular/unlabored. Pt verbalizes understanding of discharge instructions. Pt able to ambulate from ED independently.   

## 2023-03-26 NOTE — ED Provider Notes (Signed)
[facility] Provider Note    Event Date/Time   First MD Initiated Contact with Patient 03/26/23 1134     (approximate)   History   Back Pain   HPI Justin Bird is a 82 y.o. male with history of chronic low back pain presenting today for the same as well as concern for possible confusion.  Patient is here with daughter who states he lives alone and unsure if he has had any recent falls but has been complaining of low back pain.  He gets around with a walker.  She also feels as if he has had intermittent periods where he has confusion although that is not noted at this time.  The symptoms have been going on over the past 2 to 3 weeks.  Patient himself is able to state that he has some low back pain but denies pain anywhere else.  Denies pain going down either leg.  Denies any recent fevers, chills, cough, congestion, chest pain, shortness of breath, abdominal pain, nausea, vomiting, dysuria.  He notes he has had 2 falls in the last 2 months but denies any injury to the head or to his back.  Patient is able to answer all orientation questions correctly and describe recent hospital visits as well.  Daughter does note that patient reportedly has both narcotics and gabapentin at home and unsure if these are contributing to some of his symptoms.     Physical Exam   Triage Vital Signs: ED Triage Vitals [03/26/23 1115]  Encounter Vitals Group     BP (!) 185/94     Systolic BP Percentile      Diastolic BP Percentile      Pulse Rate 84     Resp 18     Temp 98.2 F (36.8 C)     Temp Source Oral     SpO2 97 %     Weight      Height      Head Circumference      Peak Flow      Pain Score 10     Pain Loc      Pain Education      Exclude from Growth Chart     Most recent vital signs: Vitals:   03/26/23 1115  BP: (!) 185/94  Pulse: 84  Resp: 18  Temp: 98.2 F (36.8 C)  SpO2: 97%   Physical Exam: I have reviewed the vital signs and nursing  notes. General: Awake, alert, no acute distress.  Nontoxic appearing. Head:  Atraumatic, normocephalic.   ENT:  EOM intact, PERRL. Oral mucosa is pink and moist with no lesions. Neck: Neck is supple with full range of motion, No meningeal signs. Cardiovascular:  RRR, No murmurs. Peripheral pulses palpable and equal bilaterally. Respiratory:  Symmetrical chest wall expansion.  No rhonchi, rales, or wheezes.  Good air movement throughout.  No use of accessory muscles.   Musculoskeletal:  No cyanosis or edema. Moving extremities with full ROM.  No tenderness palpation to C or T-spine.  Mild tenderness palpation to L-spine without obvious deformity or erythema over the site. Abdomen:  Soft, nontender, nondistended. Neuro:  GCS 15, moving all four extremities, interacting appropriately. Speech clear.  Alert and oriented to person, place, time, and situation.  Able to answer questions about recent doctors visits. Psych:  Calm, appropriate.   Skin:  Warm, dry, no rash.    ED Results / Procedures / Treatments   Labs (all labs ordered are listed,  but only abnormal results are displayed) Labs Reviewed  COMPREHENSIVE METABOLIC PANEL - Abnormal; Notable for the following components:      Result Value   Glucose, Bld 102 (*)    Creatinine, Ser 0.55 (*)    All other components within normal limits  URINALYSIS, ROUTINE W REFLEX MICROSCOPIC - Abnormal; Notable for the following components:   Color, Urine YELLOW (*)    APPearance CLEAR (*)    All other components within normal limits  CBC WITH DIFFERENTIAL/PLATELET  AMMONIA     EKG My EKG interpretation: Rate of 78, normal sinus rhythm, normal axis, normal intervals.  No acute ST elevations or depressions   RADIOLOGY Independently interpreted CT head and L-spine with no acute pathology   PROCEDURES:  Critical Care performed: No  Procedures   MEDICATIONS ORDERED IN ED: Medications - No data to display   IMPRESSION / MDM / ASSESSMENT  AND PLAN / ED COURSE  I reviewed the triage vital signs and the nursing notes.                              Differential diagnosis includes, but is not limited to, traumatic ICH, traumatic lumbar spine injury, acute on chronic low back pain, UTI, polypharmacy, dehydration.  Patient's presentation is most consistent with exacerbation of chronic illness.  Patient is an 82 year old male presenting today for acute on chronic low back pain.  Family also notes concern for possible confusion intermittently over the past 3 weeks.  No evidence of confusion on exam today patient is fully alert and oriented x 4 as well as can describe recent clinic visits which are corroborated with his chart.  CT imaging was performed of head and L-spine given concern for recent falls at home.  This showed no acute abnormalities related to trauma.  Laboratory workup and urine is also unremarkable.  Patient is on gabapentin long-term and some concern may be for polypharmacy for confusion but no other overt causes today.  Patient otherwise safe for discharge at this time with unremarkable exam and vital signs stable.  Will follow-up with primary care provider within the week for reassessment.  Given strict return precautions.  Clinical Course as of 03/26/23 1437  Sun Mar 26, 2023  1300 CT Head Wo Contrast No acute abnormalities [DW]  1348 Comprehensive metabolic panel(!) [DW]  1348 CBC with Differential Unremarkable unremarkable [DW]  1348 Ammonia: 16 [DW]  1416 CT Lumbar Spine Wo Contrast No acute pathology noted on L-spine [DW]  1429 Urinalysis, Routine w reflex microscopic -Urine, Clean Catch(!) No UTI [DW]    Clinical Course User Index [DW] Janith Lima, MD     FINAL CLINICAL IMPRESSION(S) / ED DIAGNOSES   Final diagnoses:  Chronic midline low back pain without sciatica     Rx / DC Orders   ED Discharge Orders          Ordered    oxyCODONE (ROXICODONE) 5 MG immediate release tablet  Every 8 hours  PRN        03/26/23 1437             Note:  This document was prepared using Dragon voice recognition software and may include unintentional dictation errors.   Janith Lima, MD 03/26/23 [phone]

## 2023-03-26 NOTE — ED Triage Notes (Signed)
First RN note-  Pt from home and came in by EMS, due to neurological decline over the last several weeks, but no specifics given to EMS. Pt has decreased mobility due to chronic back pain. Pt takes gabapentin and dilaudid at home, unknown if they took it today.   EMS Vitals: 152/90  HR 80 98% RA CBG 92

## 2023-03-26 NOTE — ED Triage Notes (Signed)
Pt here with lower back pain. Pt states he has been picking up his wife and he may have strained it. Daughter states pt has cognitive issues and falls a lot but does not tell anyone. Pt denies pain anywhere else.

## 2023-03-26 NOTE — Discharge Instructions (Signed)
You are seen in the Emergency Department today for back pain and confusion.  Laboratory workup and imaging was reassuring today with no acute findings.  Please follow-up with your regular doctor within a week for reassessment and to discuss your chronic medications.

## 2023-03-27 ENCOUNTER — Encounter: Payer: Self-pay | Admitting: Dermatology

## 2023-03-31 NOTE — Progress Notes (Unsigned)
Referring Physician:  Elijah Birk, MD [address] [place],  [place] [address]  Primary Physician:  Dorothey Baseman, MD  History of Present Illness: 04/03/2023 Mr. Justin Bird (82) has a history of tremor (sees neurology), anxiety, and glaucoma.   He cannot have MRI done due to cochlear implant.   He (82) was seen in ED on 03/26/23 for back pain and confusion. His son [name] was on speaker phone during the visit- he has been pleasantly confused for last 3 years. He is at his baseline.   He has constant LBP with posterior leg pain to his knees x 7 months. Pain is worse when he goes from sitting to standing. Pain is worse in the morning. He has a little improvement as the day progresses. No numbness, tingling, or weakness.   He is taking neurontin and oxycodone.   Bowel/Bladder Dysfunction: none per patient. Son states he has occasional accidents with bowel and bladder. Patient says he can't always make it to the bathroom in time (82).   Conservative measures:  Physical therapy: recent referral for HHPT from [facility]- he's had 3 visits so far (they are doing twice a week for 30 days)  Multimodal medical therapy including regular antiinflammatories: mobic, neurontin, norco   Injections:  - bilateral S1 TFESI 03/07/2023 with Celestone with no significant relief  - bilateral S1 TFESI 02/15/2023 with dexamethasone with 2 days of relief  - bilateral L3, 4 medial branch dorsal rami blocks 03/17/2021 with 80% relief during the anesthetic phase  - bilateral L3, 4 medial branch dorsal rami blocks 09/15/2020 with 80% improvement during the anesthetic phase   Past Surgery: none  Justin Bird has no symptoms of cervical myelopathy.  The symptoms are causing a significant impact on the patient's life.   Review of Systems:  A 10 point review of systems is negative, except for the pertinent positives and negatives detailed in the HPI.  Past Medical History: Past Medical History:   Diagnosis Date   Hearing deficit     Past Surgical History: Past Surgical History:  Procedure Laterality Date   COCHLEAR IMPLANT Left    PARTIAL COLECTOMY      Allergies: Allergies as of 04/03/2023 - Review Complete 04/03/2023  Allergen Reaction Noted   Opium poppy [papaver] Anaphylaxis and Hives 10/29/2019   Penicillins Anaphylaxis and Rash 10/30/2013   Sulfa antibiotics Anaphylaxis 10/30/2013   Penicillins  03/26/2023   Sulfa antibiotics  03/26/2023    Medications: Outpatient Encounter Medications as of 04/03/2023  Medication Sig   cetirizine (ZYRTEC) 10 MG tablet Take 10 mg by mouth daily.   escitalopram (LEXAPRO) 5 MG tablet Take 5 mg by mouth daily.   fluticasone (FLONASE) 50 MCG/ACT nasal spray Place into both nostrils daily.   gabapentin (NEURONTIN) 100 MG capsule Take 100 mg by mouth 2 (two) times daily.   GLUCOSAMINE SULFATE PO Take by mouth.   latanoprost (XALATAN) 0.005 % ophthalmic solution Place 1 drop into both eyes at bedtime.   Multiple Vitamin (MULTIVITAMIN WITH MINERALS) TABS tablet Take 1 tablet by mouth daily.   Multiple Vitamins-Minerals (VITAMIN D3 COMPLETE PO) Take by mouth.   oxyCODONE (ROXICODONE) 5 MG immediate release tablet Take 1 tablet (5 mg total) by mouth every 8 (eight) hours as needed for severe pain.   [DISCONTINUED] DIPHENHYDRAMINE HCL PO Take by mouth. (Patient not taking: Reported on 12/25/2019)   No facility-administered encounter medications on file as of 04/03/2023.    Social History: Social History   Tobacco Use  Smoking status: Never   Smokeless tobacco: Never  Vaping Use   Vaping status: Never Used  Substance Use Topics   Alcohol use: Not Currently   Drug use: Never    Family Medical History: History reviewed. No pertinent family history.  Physical Examination: Vitals:   04/03/23 1314 04/03/23 1343  BP: (!) 152/108 (!) 150/94    General: Patient is well developed, well nourished, calm, collected, and in no  apparent distress. Attention to examination is appropriate.  Respiratory: Patient is breathing without any difficulty.   NEUROLOGICAL:     Awake, alert, oriented to person, place, and time.  Speech is clear and fluent. Fund of knowledge is appropriate.   Cranial Nerves: Pupils equal round and reactive to light.  Facial tone is symmetric.    He has no lower lumbar tenderness.   No abnormal lesions on exposed skin.   Strength: Side Biceps Triceps Deltoid Interossei Grip Wrist Ext. Wrist Flex.  R 5 5 5 5 5 5 5   L 5 5 5 5 5 5 5    Side Iliopsoas Quads Hamstring PF DF EHL  R 5 5 5 5 5 5   L 5 5 5 5 5 5    Reflexes are 2+ and symmetric at the biceps, brachioradialis, patella and achilles.   Hoffman's is absent.  Clonus is not present.   Bilateral upper and lower extremity sensation is intact to light touch.     He ambulates with a walker.   Medical Decision Making  Imaging: CT lumbar spine dated 03/26/23:  FINDINGS: Segmentation: 5 lumbar type vertebrae.   Alignment: Grade 2 anterolisthesis of L5 on S1 secondary to chronic bilateral L5 pars defects. Mild levocurvature of the lumbar spine.   Vertebrae: The lumbar vertebral body heights are well maintained. No acute fracture   Paraspinal and other soft tissues: Aortic atherosclerosis. No acute abnormality.   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 Multilevel high-grade bilateral foraminal stenosis throughout the lumbar spine. Canal stenosis is again identified at the L2-3 level.   IMPRESSION: 1. No evidence for acute fracture or subluxation of the lumbar spine. 2. 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 3. Grade 2 anterolisthesis of L5 on S1 secondary to chronic bilateral L5 pars defects. 4. Canal stenosis is identified at L2-3 and multilevel high-grade bilateral foraminal stenosis throughout the lumbar spine. This is unchanged compared  with the previous exam. 5.  Aortic Atherosclerosis (ICD10-I70.0).     Electronically Signed   By: Signa Kell M.D.   On: 03/26/2023 13:05   I have personally reviewed the images and agree with the above interpretation.  Assessment and Plan: Mr. Harari is a pleasant 82 y.o. male has constant LBP with posterior leg pain to his knees x 7 months. LBP > leg pain. Pain is worse when he goes from sitting to standing and worse in the morning. No numbness, tingling, or weakness.   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 He has slip L5-S1, central stenosis L2-L3, and multilevel foraminal stenosis.   Treatment options discussed with patient and following plan made:   - Lumbar xrays ordered with flexion/extension.  - CT myelogram ordered of lumbar spine to further evaluate lumbar radiculopathy. Cannot have MRI due to cochlear implant.  - Continue with HHPT.  - Once I have above results, will review with Dr. Myer Haff and Dr. Katrinka Blazing to see if he is a candidate for surgery. Will then call his son [name] to discuss  results and regroup with a plan. Okay to call Ryan per patient.   BP was elevated. No symptoms of chest pain, shortness of breath, blurry vision, or headaches. He checks BP at home and it generally runs 140s/90s. Will recheck at home and call PCP if not improved. If he develops CP, SOB, blurry vision, or headaches, then he will go to ED.     I spent a total of 35 minutes in face-to-face and non-face-to-face activities related to this patient's care today including review of outside records, review of imaging, review of symptoms, physical exam, discussion of differential diagnosis, discussion of treatment options, and documentation.   Thank you for involving me in the care of this patient.   Drake Leach PA-C Dept. of Neurosurgery

## 2023-04-03 ENCOUNTER — Ambulatory Visit: Payer: Medicare Other | Admitting: Orthopedic Surgery

## 2023-04-03 ENCOUNTER — Encounter: Payer: Self-pay | Admitting: Orthopedic Surgery

## 2023-04-03 VITALS — BP 150/94 | Ht 66.0 in | Wt 168.0 lb

## 2023-04-03 DIAGNOSIS — M4726 Other spondylosis with radiculopathy, lumbar region: Secondary | ICD-10-CM | POA: Diagnosis not present

## 2023-04-03 DIAGNOSIS — M48061 Spinal stenosis, lumbar region without neurogenic claudication: Secondary | ICD-10-CM | POA: Diagnosis not present

## 2023-04-03 DIAGNOSIS — M47816 Spondylosis without myelopathy or radiculopathy, lumbar region: Secondary | ICD-10-CM

## 2023-04-03 DIAGNOSIS — M4316 Spondylolisthesis, lumbar region: Secondary | ICD-10-CM | POA: Diagnosis not present

## 2023-04-03 DIAGNOSIS — M51362 Other intervertebral disc degeneration, lumbar region with discogenic back pain and lower extremity pain: Secondary | ICD-10-CM | POA: Diagnosis not present

## 2023-04-03 DIAGNOSIS — M5416 Radiculopathy, lumbar region: Secondary | ICD-10-CM

## 2023-04-03 NOTE — Patient Instructions (Signed)
It was so nice to see you today. Thank you so much for coming in.    I want to get a CT myelogram and xrays of your lower back to look into things further. We will get this approved through your insurance and [facility] will call you to schedule the appointment.   Continue with home health PT.   After you have your imaging, it takes 10-14 days for me to get the results back. Once I have them, we will call your son [name] to discuss the results.   Your blood pressure was elevated today. I want you to recheck it at home and follow up with your PCP if it remains high. If you have any chest pain, shortness of breath, blurry vision, or headaches then you need to go to ED.    Please do not hesitate to call if you have any questions or concerns. You can also message me in MyChart.  Drake Leach PA-C [phone]

## 2023-04-11 NOTE — Progress Notes (Signed)
Patient for [facility] Lumbar Myelogram/CT Lumbar Myelogram on Wed 04/12/2023, I called and spoke with the patient on the phone and gave pre-procedure instructions. Pt was made aware to be here at 9:30a. Pt stated understanding.  Called 04/11/2023

## 2023-04-12 ENCOUNTER — Ambulatory Visit
Admission: RE | Admit: 2023-04-12 | Discharge: 2023-04-12 | Disposition: A | Discharge status: Home / Self Care | Payer: Medicare Other | Source: Ambulatory Visit | Attending: Orthopedic Surgery | Admitting: Orthopedic Surgery

## 2023-04-12 DIAGNOSIS — M4317 Spondylolisthesis, lumbosacral region: Secondary | ICD-10-CM | POA: Diagnosis not present

## 2023-04-12 DIAGNOSIS — M48061 Spinal stenosis, lumbar region without neurogenic claudication: Secondary | ICD-10-CM

## 2023-04-12 DIAGNOSIS — I700 Atherosclerosis of aorta: Secondary | ICD-10-CM | POA: Insufficient documentation

## 2023-04-12 DIAGNOSIS — M5416 Radiculopathy, lumbar region: Secondary | ICD-10-CM

## 2023-04-12 DIAGNOSIS — M4316 Spondylolisthesis, lumbar region: Secondary | ICD-10-CM | POA: Diagnosis present

## 2023-04-12 DIAGNOSIS — M47816 Spondylosis without myelopathy or radiculopathy, lumbar region: Secondary | ICD-10-CM | POA: Insufficient documentation

## 2023-04-12 DIAGNOSIS — M4726 Other spondylosis with radiculopathy, lumbar region: Secondary | ICD-10-CM | POA: Insufficient documentation

## 2023-04-12 DIAGNOSIS — M51379 Other intervertebral disc degeneration, lumbosacral region without mention of lumbar back pain or lower extremity pain: Secondary | ICD-10-CM | POA: Insufficient documentation

## 2023-04-12 MED ORDER — LIDOCAINE HCL (PF) 1 % IJ SOLN
10.0000 mL | Freq: Once | INTRAMUSCULAR | Status: AC
  Administered 2023-04-12: 9 mL
  Filled 2023-04-12: qty 10

## 2023-04-12 MED ORDER — OXYCODONE HCL 5 MG PO TABS
ORAL_TABLET | ORAL | Status: AC
  Filled 2023-04-12: qty 1

## 2023-04-12 MED ORDER — OXYCODONE HCL 5 MG PO TABS
5.0000 mg | ORAL_TABLET | Freq: Once | ORAL | Status: AC
  Administered 2023-04-12: 5 mg via ORAL
  Filled 2023-04-12: qty 1

## 2023-04-12 MED ORDER — IOHEXOL 180 MG/ML  SOLN
20.0000 mL | Freq: Once | INTRAMUSCULAR | Status: AC | PRN
  Administered 2023-04-12: 20 mL via INTRAVENOUS

## 2023-04-12 NOTE — Progress Notes (Signed)
Technically successful L3-L4 lumbar puncture with contrast injection for CT myelogram. The procedure was discussed with the patient and written consent was obtained. The patient was alert and oriented to person, place, time and situation. He was able to converse easily about the procedure and asked appropriate questions.   The patient's son [name] was also called to discuss the procedure but there was no answer. VM left.   Patient tolerated the procedure well and there were no immediate complications.   Alwyn Ren, [place] [phone] 04/12/2023, 10:55 AM

## 2023-04-13 ENCOUNTER — Encounter: Payer: Self-pay | Admitting: Orthopedic Surgery

## 2023-04-13 NOTE — Progress Notes (Signed)
CT myelogram of lumbar spine dated 04/12/23:  CT LUMBAR MYELOGRAM FINDINGS:   Segmentation: 5 lumbar vertebrae. The caudal most well-formed intervertebral disc space is designated L5-S1   Alignment: Levocurvature of the lumbar spine. Slight grade 1 retrolisthesis at L1-L2. 2 mm L2-L3 grade 1 retrolisthesis. Slight L3-L4 grade 1 retrolisthesis. 8 mm grade 2 L5-S1 anterolisthesis.   Vertebrae: Lumbar vertebral body height is maintained. Chronic bilateral L5 pars interarticularis defects. Multilevel degenerative endplate irregularity and degenerative endplate sclerosis, greatest at L4-L5 and L5-S1. Multilevel small Schmorl nodes. Multilevel ventrolateral osteophytes.   Paraspinal and other soft tissues: Aortoiliac atherosclerosis. No acute finding within included portions of the abdomen/retroperitoneum. No paraspinal mass or collection.   Disc levels:   Multilevel disc space narrowing, greatest to the left at L4-L5 (advanced) and at L5-S1 (advanced).   The conus terminates at the L1-L2 level.   T12-L1: Disc bulge. No significant spinal canal or foraminal stenosis.   L1-L2: Grade 1 retrolisthesis. Disc bulge. Superimposed right subarticular disc protrusion. Endplate osteophytes along the right aspect of the disc space. The disc protrusion results in right subarticular narrowing and could affect the descending right L2 nerve root (series 7, image 10). No significant central canal stenosis. Bilateral neural foraminal narrowing (moderate right, mild/moderate left).   L2-L3: Grade 1 retrolisthesis. Disc bulge. Large disc bulge. Endplate osteophytes along the right aspect of the disc space. Mild facet arthrosis with ligamentum flavum hypertrophy. Severe spinal canal stenosis. Redundancy of the cauda equina nerve roots cephalad to this level. Moderate bilateral neural foraminal narrowing.   L3-L4: Post laminotomy changes are questioned on the right. Correlate with the surgical  history. Grade 1 retrolisthesis. Disc bulge asymmetric to the left. Mild facet arthrosis with ligamentum flavum hypertrophy. Mild bilateral subarticular narrowing without appreciable nerve root impingement. Moderate bilateral neural foraminal narrowing (greater on the left).   L4-L5: Disc bulge. Endplate osteophytes along the left aspect of the disc space. Mild facet arthrosis. No significant spinal canal stenosis. Severe bilateral neural foraminal narrowing.   L5-S1: Chronic bilateral L5 pars interarticularis defects with 8 mm grade 2 anterolisthesis. Disc uncovering with disc bulge and endplate osteophytes. Facet degeneration. No significant spinal canal stenosis. Bilateral neural foraminal narrowing (severe right, moderate/severe left).   IMPRESSION: 1. Lumbar spondylosis as outlined. 2. At L2-L3, there is multifactorial severe spinal canal stenosis. Redundancy of the cauda equina nerve roots cephalad to this level. Moderate bilateral neural foraminal narrowing. 3. At L1-L2, a right subarticular disc protrusion results in right subarticular stenosis and could affect the descending right L2 nerve root. Bilateral neural foraminal narrowing also present at this level (moderate right, mild/moderate left). 4. At L5-S1, there are chronic bilateral L5 pars interarticularis defects with grade 2 anterolisthesis. This contributes to multifactorial bilateral neural foraminal narrowing (severe right, moderate/severe left). 5. Additional sites of mild subarticular stenosis as described. 6. Additional sites of foraminal stenosis, greatest bilaterally at L4-L5 (severe at this level). 7. Disc degeneration is greatest to the left at L4-L5 (advanced) and at L5-S1 (advanced). 8. Levocurvature of the lumbar spine. 9. Multilevel grade 1 spondylolisthesis. 10.  Aortic Atherosclerosis (ICD10-I70.0).     Electronically Signed   By: Jackey Loge D.O.   On: 04/12/2023 12:57    Lumbar xrays dated  04/12/23:  FINDINGS: Standing neutral, standing extension and standing flexion lateral view radiographs of the lumbar spine were acquired.   Grade 1 retrolisthesis at T12-L1, L1-L2, L2-L3 and L3-L4. Grade 2 anterolisthesis at L5-S1. The degree of spondylolisthesis at these levels does not  significantly change on the standing extension or standing flexion view radiographs.   No lumbar vertebral compression fracture. Known chronic bilateral L5 pars interarticularis defects.   Correlating with the same day lumbar spine CT myelogram, there is multilevel disc space narrowing, greatest to the left at L4-L5 and at L5-S1 (advanced at these sites). Multilevel degenerate endplate irregularity and sclerosis, as well as endplate spurring and endplate osteophytes. Facet arthrosis, greatest within the lower lumbar spine.   Aortic atherosclerosis.   IMPRESSION: 1. Grade 1 retrolisthesis at T12-L1, L1-L2, L2-L3 and L3-L4, and grade 2 anterolisthesis at L5-S1. The degree of spondylolisthesis at these levels does not significantly change on the standing extension or standing flexion view radiographs. 2. Known chronic bilateral L5 pars interarticularis defects. 3. Lumbar spondylosis as described. 4. Aortic Atherosclerosis (ICD10-I70.0).     Electronically Signed   By: Jackey Loge D.O.   On: 04/12/2023 13:37  Above imaging reviewed with Dr. Myer Haff.   Called patient's son [name] and discussed results. Recommend he see Dr. Myer Haff to discuss any possible surgery options. Appointment scheduled with Dr. Myer Haff.

## 2023-04-26 NOTE — Progress Notes (Unsigned)
Referring Physician:  Dorothey Baseman, MD [id number] [place],  [place] [address]  Primary Physician:  Dorothey Baseman, MD  History of Present Illness: 04/27/2023 Justin Bird is here today with a chief complaint of back and leg pain.  He gets severe pain that occurs in his back when he stands or walks for even short distances.  He gets pain in both of his calves when he stands and walks.  This has been worsening over time.  He has tried physical therapy without improvement.   Progress Note from Drake Leach, [place] on 04/03/23:   History of Present Illness: 04/03/2023 Justin Bird has a history of tremor (sees neurology), anxiety, and glaucoma.    He cannot have MRI done due to cochlear implant.    He was seen in ED on 03/26/23 for back pain and confusion. His son [name] was on speaker phone during the visit- he has been pleasantly confused for last 3 years. He is at his baseline.    He has constant LBP with posterior leg pain to his knees x 7 months. Pain is worse when he goes from sitting to standing. Pain is worse in the morning. He has a little improvement as the day progresses. No numbness, tingling, or weakness.    He is taking neurontin and oxycodone.    Bowel/Bladder Dysfunction: none per patient. Son states he has occasional accidents with bowel and bladder. Patient says he can't always make it to the bathroom in time.    Conservative measures:  Physical therapy: recent referral for HHPT from [facility]- he's had 3 visits so far (they are doing twice a week for 30 days)  Multimodal medical therapy including regular antiinflammatories: mobic, neurontin, norco   Injections:  - bilateral S1 TFESI 03/07/2023 with Celestone with no significant relief  - bilateral S1 TFESI 02/15/2023 with dexamethasone with 2 days of relief  - bilateral L3, 4 medial branch dorsal rami blocks 03/17/2021 with 80% relief during the anesthetic phase  - bilateral L3, 4 medial branch dorsal  rami blocks 09/15/2020 with 80% improvement during the anesthetic phase    Past Surgery: none  Justin Bird has no symptoms of cervical myelopathy.  The symptoms are causing a significant impact on the patient's life.   I have utilized the care everywhere function in epic to review the outside records available from external health systems.  Review of Systems:  A 10 point review of systems is negative, except for the pertinent positives and negatives detailed in the HPI.  Past Medical History: Past Medical History:  Diagnosis Date   Hearing deficit     Past Surgical History: Past Surgical History:  Procedure Laterality Date   COCHLEAR IMPLANT Left    PARTIAL COLECTOMY      Allergies: Allergies as of 04/27/2023 - Review Complete 04/27/2023  Allergen Reaction Noted   Opium poppy [papaver] Anaphylaxis and Hives 10/29/2019   Penicillins Anaphylaxis and Rash 10/30/2013   Sulfa antibiotics Anaphylaxis 10/30/2013   Penicillins  03/26/2023   Sulfa antibiotics  03/26/2023    Medications:  Current Outpatient Medications:    carbidopa-levodopa (SINEMET IR) 25-100 MG tablet, Take 2 tablets by mouth 3 (three) times daily., Disp: , Rfl:    cetirizine (ZYRTEC) 10 MG tablet, Take 10 mg by mouth daily., Disp: , Rfl:    Cholecalciferol (VITAMIN D-3) 25 MCG (1000 UT) CAPS, Take by mouth., Disp: , Rfl:    cyanocobalamin (VITAMIN B12) 1000 MCG tablet, Take 1,000 mcg by  mouth daily., Disp: , Rfl:    escitalopram (LEXAPRO) 5 MG tablet, Take 5 mg by mouth daily., Disp: , Rfl:    gabapentin (NEURONTIN) 100 MG capsule, Take 100 mg by mouth 2 (two) times daily., Disp: , Rfl:    latanoprost (XALATAN) 0.005 % ophthalmic solution, Place 1 drop into both eyes at bedtime., Disp: , Rfl:    oxyCODONE (ROXICODONE) 5 MG immediate release tablet, Take 1 tablet (5 mg total) by mouth every 8 (eight) hours as needed for severe pain., Disp: 5 tablet, Rfl: 0  Social History: Social History   Tobacco Use    Smoking status: Never   Smokeless tobacco: Never  Vaping Use   Vaping status: Never Used  Substance Use Topics   Alcohol use: Not Currently   Drug use: Never    Family Medical History: History reviewed. No pertinent family history.  Physical Examination: Vitals:   04/27/23 0959  BP: 132/82    General: Patient is in no apparent distress. Attention to examination is appropriate.  Neck:   Supple.  Full range of motion.  Respiratory: Patient is breathing without any difficulty.   NEUROLOGICAL:     Awake, alert, oriented to person, place, and time.  Speech is clear and fluent.   Cranial Nerves: Pupils equal round and reactive to light.  Facial tone is symmetric.  Facial sensation is symmetric. Shoulder shrug is symmetric. Tongue protrusion is midline.  There is no pronator drift.  Strength: Side Biceps Triceps Deltoid Interossei Grip Wrist Ext. Wrist Flex.  R 5 5 5 5 5 5 5   L 5 5 5 5 5 5 5    Side Iliopsoas Quads Hamstring PF DF EHL  R 5 5 5 5 5 5   L 5 5 5 5 5 5    Reflexes are 1+ and symmetric at the biceps, triceps, brachioradialis, patella and achilles.   Hoffman's is absent.   Bilateral upper and lower extremity sensation is intact to light touch.    No evidence of dysmetria noted.  Gait is untested.     Medical Decision Making  Imaging: CT Myelogram 04/12/2023 Disc levels:   Multilevel disc space narrowing, greatest to the left at L4-L5 (advanced) and at L5-S1 (advanced).   The conus terminates at the L1-L2 level.   T12-L1: Disc bulge. No significant spinal canal or foraminal stenosis.   L1-L2: Grade 1 retrolisthesis. Disc bulge. Superimposed right subarticular disc protrusion. Endplate osteophytes along the right aspect of the disc space. The disc protrusion results in right subarticular narrowing and could affect the descending right L2 nerve root (series 7, image 10). No significant central canal stenosis. Bilateral neural foraminal narrowing  (moderate right, mild/moderate left).   L2-L3: Grade 1 retrolisthesis. Disc bulge. Large disc bulge. Endplate osteophytes along the right aspect of the disc space. Mild facet arthrosis with ligamentum flavum hypertrophy. Severe spinal canal stenosis. Redundancy of the cauda equina nerve roots cephalad to this level. Moderate bilateral neural foraminal narrowing.   L3-L4: Post laminotomy changes are questioned on the right. Correlate with the surgical history. Grade 1 retrolisthesis. Disc bulge asymmetric to the left. Mild facet arthrosis with ligamentum flavum hypertrophy. Mild bilateral subarticular narrowing without appreciable nerve root impingement. Moderate bilateral neural foraminal narrowing (greater on the left).   L4-L5: Disc bulge. Endplate osteophytes along the left aspect of the disc space. Mild facet arthrosis. No significant spinal canal stenosis. Severe bilateral neural foraminal narrowing.   L5-S1: Chronic bilateral L5 pars interarticularis defects with 8 mm grade 2  anterolisthesis. Disc uncovering with disc bulge and endplate osteophytes. Facet degeneration. No significant spinal canal stenosis. Bilateral neural foraminal narrowing (severe right, moderate/severe left).   IMPRESSION: 1. Lumbar spondylosis as outlined. 2. At L2-L3, there is multifactorial severe spinal canal stenosis. Redundancy of the cauda equina nerve roots cephalad to this level. Moderate bilateral neural foraminal narrowing. 3. At L1-L2, a right subarticular disc protrusion results in right subarticular stenosis and could affect the descending right L2 nerve root. Bilateral neural foraminal narrowing also present at this level (moderate right, mild/moderate left). 4. At L5-S1, there are chronic bilateral L5 pars interarticularis defects with grade 2 anterolisthesis. This contributes to multifactorial bilateral neural foraminal narrowing (severe right, moderate/severe left). 5. Additional  sites of mild subarticular stenosis as described. 6. Additional sites of foraminal stenosis, greatest bilaterally at L4-L5 (severe at this level). 7. Disc degeneration is greatest to the left at L4-L5 (advanced) and at L5-S1 (advanced). 8. Levocurvature of the lumbar spine. 9. Multilevel grade 1 spondylolisthesis. 10.  Aortic Atherosclerosis (ICD10-I70.0).     Electronically Signed   By: Jackey Loge D.O.   On: 04/12/2023 12:57  I have personally reviewed the images and agree with the above interpretation.  Assessment and Plan: Justin Bird is a pleasant 82 y.o. male with back pain with bilateral sciatica.  He has anterolisthesis of L4 and L5 with pars defects at L5.  His spondylolisthesis at L5-S1 has worsened over the past 3 years in comparison to his prior CT scan in 2021.  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  He has tried and failed conservative management.  I think he has 2 options.  The less invasive would be consideration of spinal cord stimulation.  The more significant intervention would be L2-3 decompression to address the stenosis there, L4-S1 transforaminal lumbar interbody fusion to address his spinal instability at L5-S1, spondylolisthesis at L5-S1, and to increase the interpedicular distance to decompress the L4 nerve roots in the foramen.  I discussed the planned procedure at length with the patient, including the risks, benefits, alternatives, and indications. The risks discussed include but are not limited to bleeding, infection, need for reoperation, spinal fluid leak, stroke, vision loss, anesthetic complication, coma, paralysis, and even death. I also described in detail that improvement was not guaranteed.  The patient expressed understanding of these risks. I described the surgery in layman's terms, and gave ample  opportunity for questions, which were answered to the best of my ability.  I held this discussion with his son on the phone per his request.  I have encouraged him to think long and hard regarding which path he would like to pursue.  Due to his cognitive changes, he is at heightened risk of complications in the perioperative period.  If he elects to move forward with the more significant intervention, I will talk with Dr. Terance Hart to ensure that he feels that it is appropriate to move forward with this significant of a surgery.  I spent a total of 40 minutes in this patient's care today. This time was spent reviewing pertinent records including imaging studies, obtaining and confirming history, performing a directed evaluation, formulating and discussing my recommendations, and documenting the visit within the medical record.   Thank you for involving me in the care of this patient.      Justin Lenker K. Myer Haff MD, [facility] Neurosurgery

## 2023-04-27 ENCOUNTER — Telehealth: Payer: Self-pay

## 2023-04-27 ENCOUNTER — Ambulatory Visit: Payer: Medicare Other | Admitting: Neurosurgery

## 2023-04-27 ENCOUNTER — Encounter: Payer: Self-pay | Admitting: Neurosurgery

## 2023-04-27 VITALS — BP 132/82 | Ht 66.0 in | Wt 168.0 lb

## 2023-04-27 DIAGNOSIS — G8929 Other chronic pain: Secondary | ICD-10-CM

## 2023-04-27 DIAGNOSIS — M532X6 Spinal instabilities, lumbar region: Secondary | ICD-10-CM

## 2023-04-27 DIAGNOSIS — M5441 Lumbago with sciatica, right side: Secondary | ICD-10-CM

## 2023-04-27 DIAGNOSIS — M4316 Spondylolisthesis, lumbar region: Secondary | ICD-10-CM

## 2023-04-27 DIAGNOSIS — M5442 Lumbago with sciatica, left side: Secondary | ICD-10-CM | POA: Diagnosis not present

## 2023-04-27 DIAGNOSIS — M51362 Other intervertebral disc degeneration, lumbar region with discogenic back pain and lower extremity pain: Secondary | ICD-10-CM

## 2023-04-27 DIAGNOSIS — M48062 Spinal stenosis, lumbar region with neurogenic claudication: Secondary | ICD-10-CM

## 2023-04-27 NOTE — Telephone Encounter (Signed)
As I was walking the patient to the waiting room, he asked about something for pain? Can you prescribe him something or should he ask his PCP?

## 2023-04-28 NOTE — Telephone Encounter (Signed)
Left message to call back  

## 2023-05-01 NOTE — Telephone Encounter (Signed)
Patient's caretaker, Wynn Maudlin called our office back and expressed understanding regarding the medication. He states that the patient has difficulty hearing and asked for him to call on his behalf. He states that the patient's family is looking through the pamphlets provided to them to decide on the best surgical intervention.

## 2023-05-01 NOTE — Telephone Encounter (Signed)
Left message to call back  

## 2023-05-05 ENCOUNTER — Telehealth: Payer: Self-pay

## 2023-05-05 NOTE — Telephone Encounter (Signed)
Justin Bird called in. He is still unsure of which surgery he should choose. I discussed the options mentioned in Dr Justin Bird note. He has patient brochures for both surgeries. He has requested to come in for another appointment to discuss both surgeries again. He is unable to bring anyone to the appointment, but would like to call his son (who lives in [place], [place]) to be a part of the discussion while Dr Justin Bird is in the room. Appointment has been made for next week.

## 2023-05-11 ENCOUNTER — Encounter: Payer: Self-pay | Admitting: Neurosurgery

## 2023-05-11 ENCOUNTER — Ambulatory Visit: Payer: Medicare Other | Admitting: Neurosurgery

## 2023-05-11 VITALS — BP 112/62 | Ht 66.0 in | Wt 168.0 lb

## 2023-05-11 DIAGNOSIS — M48062 Spinal stenosis, lumbar region with neurogenic claudication: Secondary | ICD-10-CM

## 2023-05-11 DIAGNOSIS — M5441 Lumbago with sciatica, right side: Secondary | ICD-10-CM | POA: Diagnosis not present

## 2023-05-11 DIAGNOSIS — G8929 Other chronic pain: Secondary | ICD-10-CM

## 2023-05-11 DIAGNOSIS — M4316 Spondylolisthesis, lumbar region: Secondary | ICD-10-CM | POA: Diagnosis not present

## 2023-05-11 DIAGNOSIS — M51362 Other intervertebral disc degeneration, lumbar region with discogenic back pain and lower extremity pain: Secondary | ICD-10-CM

## 2023-05-11 DIAGNOSIS — M5442 Lumbago with sciatica, left side: Secondary | ICD-10-CM

## 2023-05-11 DIAGNOSIS — M532X6 Spinal instabilities, lumbar region: Secondary | ICD-10-CM

## 2023-05-11 NOTE — Progress Notes (Signed)
Referring Physician:  Dorothey Baseman, MD [phone] [place],  [place] [address]  Primary Physician:  Dorothey Baseman, MD  History of Present Illness: 05/11/2023 Justin Bird returns to discuss his condition.  He has decided he would like to consider a larger operation.  His visit today was done in the presence of his daughter via telephone.   04/27/2023 Justin Bird is here today with a chief complaint of back and leg pain.  He gets severe pain that occurs in his back when he stands or walks for even short distances.  He gets pain in both of his calves when he stands and walks.  This has been worsening over time.  He has tried physical therapy without improvement.   Progress Note from Drake Leach, [place] on 04/03/23:   History of Present Illness: 04/03/2023 Justin Bird has a history of tremor (sees neurology), anxiety, and glaucoma.    He cannot have MRI done due to cochlear implant.    He was seen in ED on 03/26/23 for back pain and confusion. His son [name] was on speaker phone during the visit- he has been pleasantly confused for last 3 years. He is at his baseline.    He has constant LBP with posterior leg pain to his knees x 7 months. Pain is worse when he goes from sitting to standing. Pain is worse in the morning. He has a little improvement as the day progresses. No numbness, tingling, or weakness.    He is taking neurontin and oxycodone.    Bowel/Bladder Dysfunction: none per patient. Son states he has occasional accidents with bowel and bladder. Patient says he can't always make it to the bathroom in time.    Conservative measures:  Physical therapy: recent referral for HHPT from [facility]- he's had 3 visits so far (they are doing twice a week for 30 days)  Multimodal medical therapy including regular antiinflammatories: mobic, neurontin, norco   Injections:  - bilateral S1 TFESI 03/07/2023 with Celestone with no significant relief  - bilateral S1 TFESI  02/15/2023 with dexamethasone with 2 days of relief  - bilateral L3, 4 medial branch dorsal rami blocks 03/17/2021 with 80% relief during the anesthetic phase  - bilateral L3, 4 medial branch dorsal rami blocks 09/15/2020 with 80% improvement during the anesthetic phase    Past Surgery: none  Justin Bird has no symptoms of cervical myelopathy.  The symptoms are causing a significant impact on the patient's life.   I have utilized the care everywhere function in epic to review the outside records available from external health systems.  Review of Systems:  A 10 point review of systems is negative, except for the pertinent positives and negatives detailed in the HPI.  Past Medical History: Past Medical History:  Diagnosis Date   Hearing deficit     Past Surgical History: Past Surgical History:  Procedure Laterality Date   COCHLEAR IMPLANT Left    PARTIAL COLECTOMY      Allergies: Allergies as of 05/11/2023 - Review Complete 05/11/2023  Allergen Reaction Noted   Opium poppy [papaver] Anaphylaxis and Hives 10/29/2019   Penicillins Anaphylaxis and Rash 10/30/2013   Sulfa antibiotics Anaphylaxis 10/30/2013   Penicillins  03/26/2023   Sulfa antibiotics  03/26/2023    Medications:  Current Outpatient Medications:    carbidopa-levodopa (SINEMET IR) 25-100 MG tablet, Take 2 tablets by mouth 3 (three) times daily., Disp: , Rfl:    cetirizine (ZYRTEC) 10 MG tablet, Take 10 mg  by mouth daily., Disp: , Rfl:    Cholecalciferol (VITAMIN D-3) 25 MCG (1000 UT) CAPS, Take by mouth., Disp: , Rfl:    cyanocobalamin (VITAMIN B12) 1000 MCG tablet, Take 1,000 mcg by mouth daily., Disp: , Rfl:    escitalopram (LEXAPRO) 5 MG tablet, Take 5 mg by mouth daily., Disp: , Rfl:    gabapentin (NEURONTIN) 100 MG capsule, Take 100 mg by mouth 2 (two) times daily., Disp: , Rfl:    latanoprost (XALATAN) 0.005 % ophthalmic solution, Place 1 drop into both eyes at bedtime., Disp: , Rfl:    oxyCODONE  (ROXICODONE) 5 MG immediate release tablet, Take 1 tablet (5 mg total) by mouth every 8 (eight) hours as needed for severe pain., Disp: 5 tablet, Rfl: 0  Social History: Social History   Tobacco Use   Smoking status: Never   Smokeless tobacco: Never  Vaping Use   Vaping status: Never Used  Substance Use Topics   Alcohol use: Not Currently   Drug use: Never    Family Medical History: History reviewed. No pertinent family history.  Physical Examination: There were no vitals filed for this visit.   General: Patient is in no apparent distress. Attention to examination is appropriate.  Neck:   Supple.  Full range of motion.  Respiratory: Patient is breathing without any difficulty.   NEUROLOGICAL:     Awake, alert, oriented to person, place, and time.  Speech is clear and fluent.   Cranial Nerves: Pupils equal round and reactive to light.  Facial tone is symmetric.  Facial sensation is symmetric. Shoulder shrug is symmetric. Tongue protrusion is midline.  There is no pronator drift.  Strength: Side Biceps Triceps Deltoid Interossei Grip Wrist Ext. Wrist Flex.  R 5 5 5 5 5 5 5   L 5 5 5 5 5 5 5    Side Iliopsoas Quads Hamstring PF DF EHL  R 5 5 5 5 5 5   L 5 5 5 5 5 5    Reflexes are 1+ and symmetric at the biceps, triceps, brachioradialis, patella and achilles.   Hoffman's is absent.   Bilateral upper and lower extremity sensation is intact to light touch.    No evidence of dysmetria noted.  Gait is untested.     Medical Decision Making  Imaging: CT Myelogram 04/12/2023 Disc levels:   Multilevel disc space narrowing, greatest to the left at L4-L5 (advanced) and at L5-S1 (advanced).   The conus terminates at the L1-L2 level.   T12-L1: Disc bulge. No significant spinal canal or foraminal stenosis.   L1-L2: Grade 1 retrolisthesis. Disc bulge. Superimposed right subarticular disc protrusion. Endplate osteophytes along the right aspect of the disc space. The disc  protrusion results in right subarticular narrowing and could affect the descending right L2 nerve root (series 7, image 10). No significant central canal stenosis. Bilateral neural foraminal narrowing (moderate right, mild/moderate left).   L2-L3: Grade 1 retrolisthesis. Disc bulge. Large disc bulge. Endplate osteophytes along the right aspect of the disc space. Mild facet arthrosis with ligamentum flavum hypertrophy. Severe spinal canal stenosis. Redundancy of the cauda equina nerve roots cephalad to this level. Moderate bilateral neural foraminal narrowing.   L3-L4: Post laminotomy changes are questioned on the right. Correlate with the surgical history. Grade 1 retrolisthesis. Disc bulge asymmetric to the left. Mild facet arthrosis with ligamentum flavum hypertrophy. Mild bilateral subarticular narrowing without appreciable nerve root impingement. Moderate bilateral neural foraminal narrowing (greater on the left).   L4-L5: Disc bulge. Endplate osteophytes  along the left aspect of the disc space. Mild facet arthrosis. No significant spinal canal stenosis. Severe bilateral neural foraminal narrowing.   L5-S1: Chronic bilateral L5 pars interarticularis defects with 8 mm grade 2 anterolisthesis. Disc uncovering with disc bulge and endplate osteophytes. Facet degeneration. No significant spinal canal stenosis. Bilateral neural foraminal narrowing (severe right, moderate/severe left).   IMPRESSION: 1. Lumbar spondylosis as outlined. 2. At L2-L3, there is multifactorial severe spinal canal stenosis. Redundancy of the cauda equina nerve roots cephalad to this level. Moderate bilateral neural foraminal narrowing. 3. At L1-L2, a right subarticular disc protrusion results in right subarticular stenosis and could affect the descending right L2 nerve root. Bilateral neural foraminal narrowing also present at this level (moderate right, mild/moderate left). 4. At L5-S1, there are chronic  bilateral L5 pars interarticularis defects with grade 2 anterolisthesis. This contributes to multifactorial bilateral neural foraminal narrowing (severe right, moderate/severe left). 5. Additional sites of mild subarticular stenosis as described. 6. Additional sites of foraminal stenosis, greatest bilaterally at L4-L5 (severe at this level). 7. Disc degeneration is greatest to the left at L4-L5 (advanced) and at L5-S1 (advanced). 8. Levocurvature of the lumbar spine. 9. Multilevel grade 1 spondylolisthesis. 10.  Aortic Atherosclerosis (ICD10-I70.0).     Electronically Signed   By: Jackey Loge D.O.   On: 04/12/2023 12:57  I have personally reviewed the images and agree with the above interpretation.  Assessment and Plan: Justin Bird is a pleasant 82 y.o. male with back pain with bilateral sciatica.  He has anterolisthesis of L4 and L5 with pars defects at L5.  His spondylolisthesis at L5-S1 has worsened over the past 3 years in comparison to his prior CT scan in 2021.  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  He has tried and failed conservative management.  I think he has 2 options.  The less invasive would be consideration of spinal cord stimulation.  The more significant intervention would be L2-3 decompression to address the stenosis there, L4-S1 transforaminal lumbar interbody fusion to address his spinal instability at L5-S1, spondylolisthesis at L5-S1, and to increase the interpedicular distance to decompress the L4 nerve roots in the foramen.  I would like to discuss this with his primary care provider.  I will discuss with his daughter afterwards per his and her request.  I spent a total of 10 minutes in this patient's care today. This time was spent reviewing pertinent records including imaging studies, obtaining and confirming history,  performing a directed evaluation, formulating and discussing my recommendations, and documenting the visit within the medical record.         Aylanie Cubillos K. Myer Haff MD, [facility] Neurosurgery

## 2023-06-03 ENCOUNTER — Telehealth: Payer: Self-pay | Admitting: Neurosurgery

## 2023-06-03 NOTE — Telephone Encounter (Signed)
Spoke to son regarding ddiscussion with Dr. Terance Hart.  Dr. B shares my concerns about surgical intervention given Mr. Justin Bird cognitive decline.  He will focus on medical management now, but can reconsider spinal cord stimulation if that is not effect.  Mr. Fresch (son) will discuss with father and family.

## 2023-06-29 ENCOUNTER — Emergency Department: Payer: Medicare Other

## 2023-06-29 ENCOUNTER — Inpatient Hospital Stay
Admission: EM | Admit: 2023-06-29 | Discharge: 2023-08-03 | DRG: 884 | Disposition: A | Discharge status: Skilled Nursing Facility | Payer: Medicare Other | Attending: Student in an Organized Health Care Education/Training Program | Admitting: Student in an Organized Health Care Education/Training Program

## 2023-06-29 DIAGNOSIS — F0154 Vascular dementia, unspecified severity, with anxiety: Secondary | ICD-10-CM | POA: Diagnosis not present

## 2023-06-29 DIAGNOSIS — T428X6A Underdosing of antiparkinsonism drugs and other central muscle-tone depressants, initial encounter: Secondary | ICD-10-CM | POA: Diagnosis present

## 2023-06-29 DIAGNOSIS — Z801 Family history of malignant neoplasm of trachea, bronchus and lung: Secondary | ICD-10-CM

## 2023-06-29 DIAGNOSIS — R4182 Altered mental status, unspecified: Secondary | ICD-10-CM | POA: Diagnosis not present

## 2023-06-29 DIAGNOSIS — G20A1 Parkinson's disease without dyskinesia, without mention of fluctuations: Secondary | ICD-10-CM

## 2023-06-29 DIAGNOSIS — Z882 Allergy status to sulfonamides status: Secondary | ICD-10-CM

## 2023-06-29 DIAGNOSIS — Z1152 Encounter for screening for COVID-19: Secondary | ICD-10-CM

## 2023-06-29 DIAGNOSIS — T85698A Other mechanical complication of other specified internal prosthetic devices, implants and grafts, initial encounter: Secondary | ICD-10-CM | POA: Diagnosis present

## 2023-06-29 DIAGNOSIS — Z91138 Patient's unintentional underdosing of medication regimen for other reason: Secondary | ICD-10-CM

## 2023-06-29 DIAGNOSIS — F05 Delirium due to known physiological condition: Secondary | ICD-10-CM | POA: Diagnosis present

## 2023-06-29 DIAGNOSIS — Z880 Allergy status to penicillin: Secondary | ICD-10-CM

## 2023-06-29 DIAGNOSIS — Z888 Allergy status to other drugs, medicaments and biological substances status: Secondary | ICD-10-CM

## 2023-06-29 DIAGNOSIS — G9341 Metabolic encephalopathy: Secondary | ICD-10-CM | POA: Diagnosis present

## 2023-06-29 DIAGNOSIS — Z79899 Other long term (current) drug therapy: Secondary | ICD-10-CM

## 2023-06-29 DIAGNOSIS — Z91013 Allergy to seafood: Secondary | ICD-10-CM

## 2023-06-29 DIAGNOSIS — Y722 Prosthetic and other implants, materials and accessory otorhinolaryngological devices associated with adverse incidents: Secondary | ICD-10-CM | POA: Diagnosis present

## 2023-06-29 DIAGNOSIS — H905 Unspecified sensorineural hearing loss: Secondary | ICD-10-CM | POA: Diagnosis present

## 2023-06-29 DIAGNOSIS — R339 Retention of urine, unspecified: Secondary | ICD-10-CM | POA: Diagnosis not present

## 2023-06-29 DIAGNOSIS — Z9049 Acquired absence of other specified parts of digestive tract: Secondary | ICD-10-CM

## 2023-06-29 DIAGNOSIS — F0284 Dementia in other diseases classified elsewhere, unspecified severity, with anxiety: Secondary | ICD-10-CM | POA: Diagnosis present

## 2023-06-29 DIAGNOSIS — E538 Deficiency of other specified B group vitamins: Secondary | ICD-10-CM | POA: Diagnosis present

## 2023-06-29 HISTORY — DX: Allergy, unspecified, initial encounter: T78.40XA

## 2023-06-29 HISTORY — DX: Parkinson's disease without dyskinesia, without mention of fluctuations: G20.A1

## 2023-06-29 LAB — CBC
HCT: 47.3 % (ref 39.0–52.0)
Hemoglobin: 15.6 g/dL (ref 13.0–17.0)
MCH: 29.6 pg (ref 26.0–34.0)
MCHC: 33 g/dL (ref 30.0–36.0)
MCV: 89.8 fL (ref 80.0–100.0)
Platelets: 628 10*3/uL — ABNORMAL HIGH (ref 150–400)
RBC: 5.27 MIL/uL (ref 4.22–5.81)
RDW: 13.2 % (ref 11.5–15.5)
WBC: 8.3 10*3/uL (ref 4.0–10.5)
nRBC: 0 % (ref 0.0–0.2)

## 2023-06-29 LAB — COMPREHENSIVE METABOLIC PANEL
ALT: 10 U/L (ref 0–44)
AST: 21 U/L (ref 15–41)
Albumin: 4.5 g/dL (ref 3.5–5.0)
Alkaline Phosphatase: 47 U/L (ref 38–126)
Anion gap: 14 (ref 5–15)
BUN: 17 mg/dL (ref 8–23)
CO2: 19 mmol/L — ABNORMAL LOW (ref 22–32)
Calcium: 9 mg/dL (ref 8.9–10.3)
Chloride: 101 mmol/L (ref 98–111)
Creatinine, Ser: 0.58 mg/dL — ABNORMAL LOW (ref 0.61–1.24)
GFR, Estimated: >60 mL/min (ref >60)
Glucose, Bld: 104 mg/dL — ABNORMAL HIGH (ref 70–99)
Potassium: 3.7 mmol/L (ref 3.5–5.1)
Sodium: 134 mmol/L — ABNORMAL LOW (ref 135–145)
Total Bilirubin: 1.1 mg/dL (ref 0.0–1.2)
Total Protein: 7.5 g/dL (ref 6.5–8.1)

## 2023-06-29 LAB — URINALYSIS, W/ REFLEX TO CULTURE (INFECTION SUSPECTED)
Bacteria, UA: NONE SEEN
Bilirubin Urine: NEGATIVE
Glucose, UA: NEGATIVE mg/dL
Hgb urine dipstick: NEGATIVE
Ketones, ur: 20 mg/dL — AB
Leukocytes,Ua: NEGATIVE
Nitrite: NEGATIVE
Protein, ur: NEGATIVE mg/dL
Specific Gravity, Urine: 1.019 (ref 1.005–1.030)
pH: 6 (ref 5.0–8.0)

## 2023-06-29 LAB — CBG MONITORING, ED: Glucose-Capillary: 96 mg/dL (ref 70–99)

## 2023-06-29 MED ORDER — CARBIDOPA-LEVODOPA 25-100 MG PO TABS
2.0000 | ORAL_TABLET | Freq: Three times a day (TID) | ORAL | Status: DC
  Administered 2023-06-29 – 2023-08-03 (×97): 2 via ORAL
  Filled 2023-06-29 (×102): qty 2

## 2023-06-29 MED ORDER — ENOXAPARIN SODIUM 40 MG/0.4ML IJ SOSY
40.0000 mg | PREFILLED_SYRINGE | INTRAMUSCULAR | Status: DC
  Administered 2023-06-29 – 2023-08-02 (×34): 40 mg via SUBCUTANEOUS
  Filled 2023-06-29 (×35): qty 0.4

## 2023-06-29 MED ORDER — LORAZEPAM 2 MG/ML IJ SOLN: 2.0000 mg | Freq: Once | INTRAMUSCULAR | Status: AC

## 2023-06-29 MED ORDER — HYDRALAZINE HCL 20 MG/ML IJ SOLN: 5.0000 mg | INTRAMUSCULAR | Status: DC | PRN

## 2023-06-29 MED ORDER — ACETAMINOPHEN 325 MG PO TABS
650.0000 mg | ORAL_TABLET | Freq: Four times a day (QID) | ORAL | Status: DC | PRN
  Administered 2023-06-29 (×2): 325 mg via ORAL
  Administered 2023-07-08: 650 mg via ORAL
  Filled 2023-06-29 (×2): qty 2

## 2023-06-29 MED ORDER — OLANZAPINE 10 MG IM SOLR
5.0000 mg | Freq: Once | INTRAMUSCULAR | Status: AC
  Administered 2023-06-29: 5 mg via INTRAMUSCULAR
  Filled 2023-06-29: qty 10

## 2023-06-29 MED ORDER — ONDANSETRON HCL 4 MG/2ML IJ SOLN: 4.0000 mg | Freq: Three times a day (TID) | INTRAMUSCULAR | Status: DC | PRN

## 2023-06-29 MED ORDER — HALOPERIDOL LACTATE 5 MG/ML IJ SOLN: 5.0000 mg | Freq: Once | INTRAMUSCULAR | Status: AC

## 2023-06-29 MED ORDER — HALOPERIDOL LACTATE 5 MG/ML IJ SOLN
INTRAMUSCULAR | Status: AC
  Administered 2023-06-29: 5 mg via INTRAMUSCULAR
  Filled 2023-06-29: qty 1

## 2023-06-29 MED ORDER — LORAZEPAM 2 MG/ML IJ SOLN
INTRAMUSCULAR | Status: AC
  Administered 2023-06-29: 2 mg via INTRAMUSCULAR
  Filled 2023-06-29: qty 1

## 2023-06-29 NOTE — H&P (Signed)
 History and Physical    Sanchez Hemmer FMW:[id number] DOB: [birth year] DOA: 06/29/2023  Referring MD/NP/PA:   PCP: [name], MD   Patient coming from:  The patient is coming from home.     Chief Complaint: AMS  HPI: Justin Bird is a 83 y.o. male with medical history significant of hard of hearing, depression, Parkinson's disease, allergy, who presents with altered mental status.  Patient has AMS with agitation, is unable to provide any medical history.  I called her daughter by phone, who provided most of the medical history. Per her daughter, at her normal baseline, patient is oriented to person and place, usually is confused about the time.  In the past 4 days, patient has been confused.  Patient is agitated, yelling around the ED.  He moves all extremities, kicking around.  No active nausea, vomiting, diarrhea noted.  No fever.  No respiratory distress, active cough noted.  Does not seem to have chest pain or abdominal pain.  Patient possibly missed some dose of Sinemet .  Data reviewed independently and ED Course: pt was found to have WBC 8.3, GFR> 60, temperature normal, blood pressure 173/106, 159/121, heart rate 106, RR 20, oxygen saturation 96% room air.  CT of head negative for acute intracranial abnormalities.  Patient is placed on MedSurg bed of observation.  ED physician discussed with Dr. Matthews of neurology.   EKG: I have personally reviewed.  Sinus rhythm, QTc 442, Q waves in inferior leads, low voltage, poor R wave progression   Review of Systems: Could not be review due to altered mental status.   Allergy:  Allergies  Allergen Reactions   Opium Poppy [Papaver] Anaphylaxis and Hives   Penicillins Anaphylaxis and Rash   Sulfa Antibiotics Anaphylaxis   Penicillins    Sulfa Antibiotics     Past Medical History:  Diagnosis Date   Allergy    Hearing deficit    Parkinson's disease [facility])     Past Surgical History:  Procedure Laterality Date    COCHLEAR IMPLANT Left    PARTIAL COLECTOMY      Social History:  reports that he has never smoked. He has never used smokeless tobacco. He reports that he does not currently use alcohol . He reports that he does not use drugs.  Family History:  Family History  Problem Relation Age of Onset   Lung cancer Mother      Prior to Admission medications   Medication Sig Start Date End Date Taking? Authorizing Provider  carbidopa -levodopa  (SINEMET  IR) 25-100 MG tablet Take 2 tablets by mouth 3 (three) times daily. 10/26/22   [provider]  cetirizine (ZYRTEC) 10 MG tablet Take 10 mg by mouth daily.    [provider]  Cholecalciferol (VITAMIN D -3) 25 MCG (1000 UT) CAPS Take by mouth.    [provider]  cyanocobalamin  (VITAMIN B12) 1000 MCG tablet Take 1,000 mcg by mouth daily.    [provider]  escitalopram  (LEXAPRO ) 5 MG tablet Take 5 mg by mouth daily.    [provider]  gabapentin  (NEURONTIN ) 100 MG capsule Take 100 mg by mouth 2 (two) times daily. 03/21/23   [provider]  latanoprost  (XALATAN ) 0.005 % ophthalmic solution Place 1 drop into both eyes at bedtime. 11/26/19   [provider]    Physical Exam: Vitals:   06/29/23 1930 06/29/23 2030 06/29/23 2224 06/29/23 2347  BP: (!) 159/95   (!) 168/88  Pulse: (!) 102 (!) 116  87  Resp:  16  Temp:   98.3 F (36.8 C) 98 F (36.7 C)  TempSrc:   Oral Axillary  SpO2: 94% 97%  95%   General: Not in acute distress HEENT:       Eyes: PERRL, EOMI, no jaundice       ENT: No discharge from the ears and nose       Neck: No JVD, no bruit, no mass felt. Heme: No neck lymph node enlargement. Cardiac: S1/S2, RRR, No murmurs, No gallops or rubs. Respiratory: No rales, wheezing, rhonchi or rubs. GI: Soft, nondistended, nontender, no organomegaly, BS present. GU: No hematuria Ext: No pitting leg edema bilaterally. 1+DP/PT pulse bilaterally. Musculoskeletal: No joint deformities, No  joint redness or warmth, no limitation of ROM in spin. Skin: No rashes.  Neuro: Patient is confused, agitated, not following command, not oriented X3, cranial nerves II-XII grossly intact, moves all extremities  Psych: Patient is not psychotic, no suicidal or hemocidal ideation.  Labs on Admission: I have personally reviewed following labs and imaging studies  CBC: Recent Labs  Lab 06/29/23 1447  WBC 8.3  HGB 15.6  HCT 47.3  MCV 89.8  PLT 628*   Basic Metabolic Panel: Recent Labs  Lab 06/29/23 1447  NA 134*  K 3.7  CL 101  CO2 19*  GLUCOSE 104*  BUN 17  CREATININE 0.58*  CALCIUM 9.0   GFR: CrCl cannot be calculated (Unknown ideal weight.). Liver Function Tests: Recent Labs  Lab 06/29/23 1447  AST 21  ALT 10  ALKPHOS 47  BILITOT 1.1  PROT 7.5  ALBUMIN 4.5   No results for input(s): LIPASE, AMYLASE in the last 168 hours. No results for input(s): AMMONIA in the last 168 hours. Coagulation Profile: No results for input(s): INR, PROTIME in the last 168 hours. Cardiac Enzymes: No results for input(s): CKTOTAL, CKMB, CKMBINDEX, TROPONINI in the last 168 hours. BNP (last 3 results) No results for input(s): PROBNP in the last 8760 hours. HbA1C: No results for input(s): HGBA1C in the last 72 hours. CBG: Recent Labs  Lab 06/29/23 1858  GLUCAP 96   Lipid Profile: No results for input(s): CHOL, HDL, LDLCALC, TRIG, CHOLHDL, LDLDIRECT in the last 72 hours. Thyroid Function Tests: No results for input(s): TSH, T4TOTAL, FREET4, T3FREE, THYROIDAB in the last 72 hours. Anemia Panel: No results for input(s): VITAMINB12, FOLATE, FERRITIN, TIBC, IRON, RETICCTPCT in the last 72 hours. Urine analysis:    Component Value Date/Time   COLORURINE YELLOW (A) 06/29/2023 1902   APPEARANCEUR CLEAR (A) 06/29/2023 1902   LABSPEC 1.019 06/29/2023 1902   PHURINE 6.0 06/29/2023 1902   GLUCOSEU NEGATIVE 06/29/2023 1902   HGBUR  NEGATIVE 06/29/2023 1902   BILIRUBINUR NEGATIVE 06/29/2023 1902   KETONESUR 20 (A) 06/29/2023 1902   PROTEINUR NEGATIVE 06/29/2023 1902   NITRITE NEGATIVE 06/29/2023 1902   LEUKOCYTESUR NEGATIVE 06/29/2023 1902   Sepsis Labs: @LABRCNTIP (procalcitonin:4,lacticidven:4) )No results found for this or any previous visit (from the past 240 hours).   Radiological Exams on Admission:   Assessment/Plan Principal Problem:   Acute metabolic encephalopathy Active Problems:   Parkinson's disease (HCC)   Assessment and Plan:  Acute metabolic encephalopathy: Etiology is not clear.  CT head negative for acute intracranial abnormalities.  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  Potential differential diagnosis to include early stage of dementia, delirium, vitamin B12 deficiency and abnormal thyroid function.  Patient has possibly missed some dose of Sinemet , which may have contributed partially due to withdrawal.  Patient  received 1 dose of Ativan  2 mg and 5 mg hold all in ED, still agitated.  Then give 5 mg of olanzapine  with some improvement.  ED physician discussed with Dr. Matthews of neurology, who recommended resume sentiment.  -Placed on MedSurg bed for patient -Frequent neurocheck -Fall precaution -prn Haldol  -Check vitamin B12, TSH and RPR  Parkinson's disease (HCC) -Sinemet     DVT ppx: SQ Lovenox   Code Status: Full code per her daughter  Family Communication:  Yes, patient's daughter   by phone  Disposition Plan:  Anticipate discharge back to previous environment  Consults called:  ED physician discussed with Dr. Matthews of neurology.  Admission status and Level of care: Med-Surg:    for obs as inpt        Dispo: The patient is from: Home              Anticipated d/c is to: Home              Anticipated d/c date is: 1 day              Patient currently is not medically stable to d/c.    Severity of Illness:  The appropriate patient status for this  patient is OBSERVATION. Observation status is judged to be reasonable and necessary in order to provide the required intensity of service to ensure the patient's safety. The patient's presenting symptoms, physical exam findings, and initial radiographic and laboratory data in the context of their medical condition is felt to place them at decreased risk for further clinical deterioration. Furthermore, it is anticipated that the patient will be medically stable for discharge from the hospital within 2 midnights of admission.        Date of Service 06/30/2023    Caleb Exon [facility]   If 7PM-7AM, please contact night-coverage [email or web address] 06/30/2023, 12:46 AM

## 2023-06-29 NOTE — ED Notes (Signed)
 Unable to dc restraints @ this time d/t pt continued agitation and restlessness pt continues to attempt to remove all necessary/ ordered monitoring

## 2023-06-29 NOTE — ED Triage Notes (Addendum)
 Pt to ED via [facility] from home. EMS reports increased confusion and AMS x3 days per family. Family reports pt has not been taking medication as prescribed. Pt A&Ox2. Pt very hard of hearing. No medical hx listed in chart. Pt poor historian. Pt reports lives at home alone - wife is in nursing home an family is from [place] . Pt denies CP or SOB.

## 2023-06-29 NOTE — ED Notes (Signed)
 Condom cath changed, small size

## 2023-06-29 NOTE — ED Notes (Signed)
 This RN was assisting another patient to the restroom and observed pt attempting to get out of the recliner. Allied officer attempted to redirected pt until this RN returned however pt not able to be redirected. This RN, Massie, EDT, and Manuelita, RN all attempted to redirect pt and get him back in the recliner. Pt became extremely combative, trying to hit and kick staff. Charge RN was called and pt was assigned to rm 18. This RN, Manuelita, RN, Massie, EDT, Faith, EDT, and the Allied officer escorted pt back to the room. Pt continued to be combative, grabbing at staff and squeezing hands and trying to kick. Pt had to be held in the recliner while being moved because he was trying to get out. Pt yelling for his son and his wife, who are not here. Additional staff arrived to assist with medicating and moving patient to the bed. Restraints requested due to pt being a risk of harm to himself and staff.

## 2023-06-29 NOTE — ED Notes (Signed)
 Male condom cath applied after pt had one episode of urination. Soaked pants removed and placed in pt belongings bag. Non skid socks applied. Pt tolerated well. NAD.

## 2023-06-29 NOTE — ED Notes (Signed)
 5 Haldol given

## 2023-06-29 NOTE — ED Notes (Addendum)
 Pt stated that he needed to use the restroom. This RN and Massie EDT attempted to assist pt to the restroom. Pt sat on the toilet with his pants still up. When told his pants were still up he got up and would not sit back down.  Pt placed in a recliner due to concerns for him fall as pt has attempted multiple times to get out of the wheelchair. Pt attempted to get out of the recliner so he was moved beside first nurse for safety.

## 2023-06-29 NOTE — ED Notes (Addendum)
 Restraints applied. Pt is screaming for his wife and stating " I don't want to die" and asking for his wife. Pt unable to follow commands and confused at this time.

## 2023-06-29 NOTE — ED Notes (Signed)
 Per Dr Derrill Kay LUE restraint removed to evaluate pt ability to cease dislodgement of necessary monitoring lines and external cath pt noted to be continually restless 2hr documentation completed sitter @  bedside

## 2023-06-29 NOTE — ED Provider Notes (Signed)
 [facility] Provider Note    Event Date/Time   First MD Initiated Contact with Patient 06/29/23 1721     (approximate)   History   Altered Mental Status   HPI  Stone Spirito is a 83 y.o. male who presents to the emergency department today because of concerns for altered mental status.  The patient cannot give any history himself.  Did discuss with the patient's family over the telephone.  Son states that they had been with the patient over the past few days.  They had noticed a decrease in his cognitive ability.  They do think he missed at least 1 dose of his medications.  Apparently he lives alone.     Physical Exam   Triage Vital Signs: ED Triage Vitals [06/29/23 1448]  Encounter Vitals Group     BP (!) 146/105     Systolic BP Percentile      Diastolic BP Percentile      Pulse Rate 92     Resp 20     Temp 98 F (36.7 C)     Temp Source Oral     SpO2 98 %     Weight      Height      Head Circumference      Peak Flow      Pain Score      Pain Loc      Pain Education      Exclude from Growth Chart     Most recent vital signs: Vitals:   06/29/23 1448  BP: (!) 146/105  Pulse: 92  Resp: 20  Temp: 98 F (36.7 C)  SpO2: 98%   General: Awake, agitated, yelling. CV:  Good peripheral perfusion.  Resp:  Normal effort.  Abd:  No distention.    ED Results / Procedures / Treatments   Labs (all labs ordered are listed, but only abnormal results are displayed) Labs Reviewed  COMPREHENSIVE METABOLIC PANEL - Abnormal; Notable for the following components:      Result Value   Sodium 134 (*)    CO2 19 (*)    Glucose, Bld 104 (*)    Creatinine, Ser 0.58 (*)    All other components within normal limits  CBC - Abnormal; Notable for the following components:   Platelets 628 (*)    All other components within normal limits  URINALYSIS, W/ REFLEX TO CULTURE (INFECTION SUSPECTED)  CBG MONITORING, ED     EKG  I, Guadalupe Eagles,  attending physician, personally viewed and interpreted this EKG  EKG Time: 1449 Rate: 90 Rhythm: normal sinus rhythm Axis: normal Intervals: qtc 442 QRS: narrow, q waves II, III, aVF ST changes: no st elevation Impression: abnormal ekg    RADIOLOGY I independently interpreted and visualized the CT head. My interpretation: No bleed Radiology interpretation:  IMPRESSION:  1. No evidence of acute intracranial abnormality.  2. Mild chronic small vessel ischemic disease.      PROCEDURES:  Critical Care performed: No   MEDICATIONS ORDERED IN ED: Medications  LORazepam  (ATIVAN ) injection 2 mg (has no administration in time range)  haloperidol  lactate (HALDOL ) injection 5 mg (5 mg Intramuscular Given 06/29/23 1708)     IMPRESSION / MDM / ASSESSMENT AND PLAN / ED COURSE  I reviewed the triage vital signs and the nursing notes.  Differential diagnosis includes, but is not limited to, infection, ICH, medication withdrawal  Patient's presentation is most consistent with acute presentation with potential threat to life or bodily function.   The patient is on the cardiac monitor to evaluate for evidence of arrhythmia and/or significant heart rate changes.  Patient presented to the emergency department today because of concern for altered mental status.  While in the waiting room the patient became quite agitated and started yelling.  Given the patient's level of agitation I did have concerns about the ability to help manage his medical problems.  He was given medication and had to be placed in restraints to be able to further carry out medical evaluation and management.  Patient did calm down significantly after medication.  Head CT did not show any acute intracranial abnormalities.  Blood work without significant leukocytosis and urine without signs of infection.  Patient is on carbidopa  and I do wonder if he has been missing doses if he is having  withdrawal from that.  Will plan on restarting medication.  Discussed with Dr. Hilma with the hospitalist service who will evaluate for admission.      FINAL CLINICAL IMPRESSION(S) / ED DIAGNOSES   Final diagnoses:  Altered mental status, unspecified altered mental status type      Note:  This document was prepared using Dragon voice recognition software and may include unintentional dictation errors.    Floy Roberts, MD 06/29/23 2046

## 2023-06-30 ENCOUNTER — Encounter: Payer: Self-pay | Admitting: Internal Medicine

## 2023-06-30 DIAGNOSIS — T428X6A Underdosing of antiparkinsonism drugs and other central muscle-tone depressants, initial encounter: Secondary | ICD-10-CM | POA: Diagnosis present

## 2023-06-30 DIAGNOSIS — H905 Unspecified sensorineural hearing loss: Secondary | ICD-10-CM | POA: Diagnosis present

## 2023-06-30 DIAGNOSIS — G20B2 Parkinson's disease with dyskinesia, with fluctuations: Secondary | ICD-10-CM | POA: Diagnosis not present

## 2023-06-30 DIAGNOSIS — E538 Deficiency of other specified B group vitamins: Secondary | ICD-10-CM | POA: Diagnosis present

## 2023-06-30 DIAGNOSIS — G20A1 Parkinson's disease without dyskinesia, without mention of fluctuations: Secondary | ICD-10-CM | POA: Diagnosis present

## 2023-06-30 DIAGNOSIS — Z91138 Patient's unintentional underdosing of medication regimen for other reason: Secondary | ICD-10-CM | POA: Diagnosis not present

## 2023-06-30 DIAGNOSIS — G9341 Metabolic encephalopathy: Secondary | ICD-10-CM

## 2023-06-30 DIAGNOSIS — R4182 Altered mental status, unspecified: Secondary | ICD-10-CM | POA: Diagnosis present

## 2023-06-30 DIAGNOSIS — F0154 Vascular dementia, unspecified severity, with anxiety: Secondary | ICD-10-CM | POA: Diagnosis present

## 2023-06-30 DIAGNOSIS — F0284 Dementia in other diseases classified elsewhere, unspecified severity, with anxiety: Secondary | ICD-10-CM | POA: Diagnosis present

## 2023-06-30 DIAGNOSIS — F05 Delirium due to known physiological condition: Secondary | ICD-10-CM | POA: Diagnosis present

## 2023-06-30 DIAGNOSIS — Z91013 Allergy to seafood: Secondary | ICD-10-CM | POA: Diagnosis not present

## 2023-06-30 DIAGNOSIS — Z9049 Acquired absence of other specified parts of digestive tract: Secondary | ICD-10-CM | POA: Diagnosis not present

## 2023-06-30 DIAGNOSIS — R339 Retention of urine, unspecified: Secondary | ICD-10-CM | POA: Diagnosis not present

## 2023-06-30 DIAGNOSIS — Z888 Allergy status to other drugs, medicaments and biological substances status: Secondary | ICD-10-CM | POA: Diagnosis not present

## 2023-06-30 DIAGNOSIS — Z882 Allergy status to sulfonamides status: Secondary | ICD-10-CM | POA: Diagnosis not present

## 2023-06-30 DIAGNOSIS — T85698A Other mechanical complication of other specified internal prosthetic devices, implants and grafts, initial encounter: Secondary | ICD-10-CM | POA: Diagnosis present

## 2023-06-30 DIAGNOSIS — Y722 Prosthetic and other implants, materials and accessory otorhinolaryngological devices associated with adverse incidents: Secondary | ICD-10-CM | POA: Diagnosis present

## 2023-06-30 DIAGNOSIS — G20A2 Parkinson's disease without dyskinesia, with fluctuations: Secondary | ICD-10-CM | POA: Diagnosis not present

## 2023-06-30 DIAGNOSIS — Z880 Allergy status to penicillin: Secondary | ICD-10-CM | POA: Diagnosis not present

## 2023-06-30 DIAGNOSIS — Z1152 Encounter for screening for COVID-19: Secondary | ICD-10-CM | POA: Diagnosis not present

## 2023-06-30 DIAGNOSIS — Z801 Family history of malignant neoplasm of trachea, bronchus and lung: Secondary | ICD-10-CM | POA: Diagnosis not present

## 2023-06-30 DIAGNOSIS — Z79899 Other long term (current) drug therapy: Secondary | ICD-10-CM | POA: Diagnosis not present

## 2023-06-30 DIAGNOSIS — Z515 Encounter for palliative care: Secondary | ICD-10-CM | POA: Diagnosis not present

## 2023-06-30 LAB — HIV ANTIBODY (ROUTINE TESTING W REFLEX): HIV Screen 4th Generation wRfx: NONREACTIVE

## 2023-06-30 LAB — BASIC METABOLIC PANEL
Anion gap: 10 (ref 5–15)
BUN: 16 mg/dL (ref 8–23)
CO2: 23 mmol/L (ref 22–32)
Calcium: 8.6 mg/dL — ABNORMAL LOW (ref 8.9–10.3)
Chloride: 104 mmol/L (ref 98–111)
Creatinine, Ser: 0.62 mg/dL (ref 0.61–1.24)
GFR, Estimated: >60 mL/min (ref >60)
Glucose, Bld: 106 mg/dL — ABNORMAL HIGH (ref 70–99)
Potassium: 3.4 mmol/L — ABNORMAL LOW (ref 3.5–5.1)
Sodium: 137 mmol/L (ref 135–145)

## 2023-06-30 LAB — RPR: RPR Ser Ql: NONREACTIVE

## 2023-06-30 LAB — VITAMIN B12: Vitamin B-12: 319 pg/mL (ref 180–914)

## 2023-06-30 LAB — SARS CORONAVIRUS 2 BY RT PCR: SARS Coronavirus 2 by RT PCR: NEGATIVE

## 2023-06-30 LAB — TSH: TSH: 1.036 u[IU]/mL (ref 0.350–4.500)

## 2023-06-30 MED ORDER — ZIPRASIDONE MESYLATE 20 MG IM SOLR
10.0000 mg | INTRAMUSCULAR | Status: DC | PRN
  Administered 2023-06-30 – 2023-07-23 (×16): 10 mg via INTRAMUSCULAR
  Filled 2023-06-30 (×24): qty 20

## 2023-06-30 MED ORDER — ZIPRASIDONE MESYLATE 20 MG IM SOLR: 10.0000 mg | Freq: Once | INTRAMUSCULAR | Status: AC

## 2023-06-30 MED ORDER — ZIPRASIDONE MESYLATE 20 MG IM SOLR
INTRAMUSCULAR | Status: AC
  Administered 2023-06-30: 10 mg via INTRAMUSCULAR
  Filled 2023-06-30: qty 20

## 2023-06-30 MED ORDER — LATANOPROST 0.005 % OP SOLN
1.0000 [drp] | Freq: Every day | OPHTHALMIC | Status: DC
  Administered 2023-06-30 – 2023-08-02 (×30): 1 [drp] via OPHTHALMIC
  Filled 2023-06-30: qty 2.5

## 2023-06-30 MED ORDER — HALOPERIDOL LACTATE 5 MG/ML IJ SOLN
1.0000 mg | Freq: Four times a day (QID) | INTRAMUSCULAR | Status: DC | PRN
  Administered 2023-06-30: 1 mg via INTRAVENOUS
  Filled 2023-06-30: qty 1

## 2023-06-30 MED ORDER — VITAMIN B-12 1000 MCG PO TABS
1000.0000 ug | ORAL_TABLET | Freq: Every day | ORAL | Status: DC
  Administered 2023-07-01 – 2023-08-03 (×33): 1000 ug via ORAL
  Filled 2023-06-30: qty 1
  Filled 2023-06-30: qty 2
  Filled 2023-06-30 (×32): qty 1

## 2023-06-30 MED ORDER — GABAPENTIN 100 MG PO CAPS
100.0000 mg | ORAL_CAPSULE | Freq: Two times a day (BID) | ORAL | Status: DC
  Administered 2023-06-30 – 2023-07-02 (×5): 100 mg via ORAL
  Filled 2023-06-30 (×7): qty 1

## 2023-06-30 MED ORDER — ESCITALOPRAM OXALATE 10 MG PO TABS
5.0000 mg | ORAL_TABLET | Freq: Every day | ORAL | Status: DC
  Administered 2023-07-01 – 2023-07-02 (×2): 5 mg via ORAL
  Filled 2023-06-30 (×3): qty 1

## 2023-06-30 NOTE — ED Notes (Signed)
 Report to [employer] to be @ bs pt remains in 4 point restraints to protect oredered invasive lines currently present

## 2023-06-30 NOTE — ED Notes (Signed)
 Patient removed from 4 point soft restraints, no signs of injury noted to bilateral wrists and ankles; placed in hand mittens.

## 2023-06-30 NOTE — TOC PASRR Note (Signed)
 To Whom It May Concern:  Please be advised that the above-name patient's dementia diagnosis is primary and supersedes his mental illness.     Please be advised that the above-named patient will require a short-term nursing home stay - anticipated 30 days or less for rehabilitation and strengthening.  The plan is for return home.

## 2023-06-30 NOTE — Evaluation (Signed)
 Occupational Therapy Evaluation Patient Details Name: Justin Bird MRN: [id number] DOB: [birth year] Today's Date: 06/30/2023   History of Present Illness Justin Bird is a 83 y.o. male with medical history significant of hard of hearing, depression, Parkinson's disease, allergy, who presents with altered mental status.   Clinical Impression   Justin Bird was seen for OT evaluation this date. Pt presents with AMS, significant confusion, unable to answer questions regarding PLOF/home set up and no family/caregiver available. Per chart, pt was residing at home alone [place] with family living out of state. Pt presents to acute OT demonstrating impaired ADL performance and functional mobility 2/2 decreased cognition, decreased safety awareness, and poor balance (See OT problem list for additional functional deficits). Pt currently requires MAX-TOTAL A +2 for bed mobility, MAX A to maintain upright sitting position at EOB, TOTAL A for bed level peri-care/ADL management. Pt would benefit from skilled OT services to address noted impairments and functional limitations (see below for any additional details) in order to maximize safety and independence while minimizing falls risk and caregiver burden. Anticipate the need for follow up therapy services upon acute hospital DC (< 3 hours /day).        If plan is discharge home, recommend the following: Two people to help with walking and/or transfers;Direct supervision/assist for financial management;Two people to help with bathing/dressing/bathroom;Direct supervision/assist for medications management;Supervision due to cognitive status;Assistance with cooking/housework;Help with stairs or ramp for entrance;Assist for transportation    Functional Status Assessment  Patient has had a recent decline in their functional status and demonstrates the ability to make significant improvements in function in a reasonable and predictable amount of time.   Equipment Recommendations  Other (comment) (defer to next venue of care)    Recommendations for Other Services       Precautions / Restrictions Precautions Precautions: Fall Restrictions Weight Bearing Restrictions Per Provider Order: No      Mobility Bed Mobility Overal bed mobility: Needs Assistance Bed Mobility: Supine to Sit, Sit to Supine     Supine to sit: Max assist, +2 for physical assistance Sit to supine: Total assist, +2 for physical assistance        Transfers                   General transfer comment: Unsafe/unable to attempt      Balance Overall balance assessment: Needs assistance Sitting-balance support: Bilateral upper extremity supported, Feet supported Sitting balance-Leahy Scale: Zero Sitting balance - Comments: heavy posterior lean. Requires MAX A to maintain upright posture at EOB Postural control: Posterior lean                                 ADL either performed or assessed with clinical judgement   ADL Overall ADL's : Needs assistance/impaired                                       General ADL Comments: Pt functionally limited by significant confusion, decreased alertness, and decreased safety awaerness. He requires MAX-TOTAL A +2 to come to sit at edge of gurney bed and is unable to sustain upright sitting position without MAX A +1. Attempted to facilitate self-drinking and pt is not able to take sips from a straw with MAX A to hold to mouth. Anticipate TOTAL A for toileting at bed level at this  time, MAX A +2 for any functional transfers from STS or LB ADL management.     Vision         Perception         Praxis         Pertinent Vitals/Pain Pain Assessment Pain Assessment: PAINAD Breathing: normal Negative Vocalization: occasional moan/groan, low speech, negative/disapproving quality Facial Expression: smiling or inexpressive Body Language: relaxed Consolability: no need to  console PAINAD Score: 1 Pain Intervention(s): Limited activity within patient's tolerance, Monitored during session     Extremity/Trunk Assessment Upper Extremity Assessment Upper Extremity Assessment: Difficult to assess due to impaired cognition   Lower Extremity Assessment Lower Extremity Assessment: Difficult to assess due to impaired cognition       Communication Communication Communication: Difficulty following commands/understanding;Difficulty communicating thoughts/reduced clarity of speech;Hearing impairment Cueing Techniques: Verbal cues;Gestural cues;Tactile cues;Visual cues   Cognition Arousal: Lethargic Behavior During Therapy: Restless, Agitated, Impulsive Overall Cognitive Status: No family/caregiver present to determine baseline cognitive functioning Area of Impairment: Orientation, Following commands, Safety/judgement                 Orientation Level: Disoriented to, Person, Place, Time, Situation     Following Commands: Follows one step commands inconsistently Safety/Judgement: Decreased awareness of safety, Decreased awareness of deficits     General Comments: Pt lethargic, difficult to rouse, but does wake briefly to tactile stim. Alertness increases with assist to sit at EOB, however pt remains confused. Does not follow commands, does grab therapists hands during session. Only vocalizations are What are you doing to me?SABRA No family/caregiver at bedside to determine baseline cognitive function.     General Comments       Exercises Other Exercises Other Exercises: Pt education limited 2/2 decreased cognition. Attempted education on safety, falls prevention, and bed mobility during session with limited evidence of learning.   Shoulder Instructions      Home Living Family/patient expects to be discharged to:: Private residence Living Arrangements: Alone                               Additional Comments: Pt lethargic t/o session. No  family/caregiver available to confirm PLOF or home set up. Per chart, pt living at home alone, adult children live out of state.      Prior Functioning/Environment                 ADLs Comments: Per chart, pt generally oriented to self and place. Lives alone, spouse is in assisted living facility/SNF        OT Problem List: Decreased coordination;Decreased cognition;Decreased safety awareness;Impaired balance (sitting and/or standing);Decreased knowledge of use of DME or AE      OT Treatment/Interventions: Self-care/ADL training;Therapeutic exercise;Therapeutic activities;DME and/or AE instruction;Patient/family education;Balance training;Energy conservation    OT Goals(Current goals can be found in the care plan section) Acute Rehab OT Goals Patient Stated Goal: Unable to state OT Goal Formulation: Patient unable to participate in goal setting Time For Goal Achievement: 07/14/23 ADL Goals Pt Will Perform Eating: sitting;with supervision;with set-up Pt Will Perform Grooming: sitting;with set-up;with supervision Pt Will Perform Upper Body Dressing: sitting;with min assist Pt Will Perform Lower Body Dressing: sit to/from stand;with min assist;with adaptive equipment (c LRAD)  OT Frequency: Min 1X/week    Co-evaluation              AM-PAC OT 6 Clicks Daily Activity     Outcome Measure Help from  another person eating meals?: A Lot Help from another person taking care of personal grooming?: A Lot Help from another person toileting, which includes using toliet, bedpan, or urinal?: Total Help from another person bathing (including washing, rinsing, drying)?: Total Help from another person to put on and taking off regular upper body clothing?: A Lot Help from another person to put on and taking off regular lower body clothing?: Total 6 Click Score: 9   End of Session    Activity Tolerance: Patient limited by lethargy;Treatment limited secondary to agitation Patient  left: in bed;with call bell/phone within reach;with nursing/sitter in room (In gurney bed with all rails up, safety sitter present, safety mitts donned)  OT Visit Diagnosis: Other abnormalities of gait and mobility (R26.89);Other symptoms and signs involving cognitive function                Time: 9089-9074 OT Time Calculation (min): 15 min Charges:  OT General Charges $OT Visit: 1 Visit OT Evaluation $OT Eval Moderate Complexity: 1 Mod  Tenise Stetler [place]asta, M.S., OTR/L 06/30/23, 11:30 AM

## 2023-06-30 NOTE — Progress Notes (Signed)
 PROGRESS NOTE    Justin Bird  FMW:[id number] DOB: [birth year] DOA: 06/29/2023 PCP: [name], MD    Brief Narrative:   83 y.o. male with medical history significant of hard of hearing, depression, Parkinson's disease, allergy, who presents with altered mental status.   Patient has AMS with agitation, is unable to provide any medical history.  I called her daughter by phone, who provided most of the medical history. Per her daughter, at her normal baseline, patient is oriented to person and place, usually is confused about the time.  In the past 4 days, patient has been confused.  Patient is agitated, yelling around the ED.  He moves all extremities, kicking around.  No active nausea, vomiting, diarrhea noted.  No fever.  No respiratory distress, active cough noted.  Does not seem to have chest pain or abdominal pain.  Patient possibly missed some dose of Sinemet .   Assessment & Plan:   Principal Problem:   Acute metabolic encephalopathy Active Problems:   Parkinson's disease (HCC)   Acute metabolic encephalopathy: Etiology is not clear.  CT head negative for acute intracranial abnormalities.  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  Potential differential diagnosis to include early stage of dementia, delirium, vitamin B12 deficiency and abnormal thyroid function.  Patient has possibly missed some dose of Sinemet , which may have contributed partially due to withdrawal.  Patient received IV Ativan , IV Haldol , IM Zyprexa  in ED.  Plan: Fall precautions Administer Sinemet  when safely possible IM Geodon  for agitation Avoid benzodiazepines  Parkinson's disease (HCC) Administer Sinemet  when safely possible     DVT prophylaxis: SQ Lovenox  Code Status: Full Family Communication:Son Ryan [phone] on 1/3 Disposition Plan: Status is: Inpatient Remains inpatient appropriate because: Acute metabolic encephalopathy   Level of care: Med-Surg  Consultants:   Psychiatry  Procedures:  None  Antimicrobials: None    Subjective: Seen and examined.  Unable to provide history.  Objective: Vitals:   06/30/23 1100 06/30/23 1130 06/30/23 1200 06/30/23 1230  BP: 113/70 (!) 115/54 113/64 (!) 121/98  Pulse: 73 66 66 78  Resp:      Temp:      TempSrc:      SpO2: 94% 95% 94% 99%   No intake or output data in the 24 hours ending 06/30/23 1402 There were no vitals filed for this visit.  Examination:  General exam: Appears lethargic Respiratory system: Clear to auscultation. Respiratory effort normal. Cardiovascular system: S1-S2, RRR, no murmurs, no pedal edema Gastrointestinal system: Soft,/ND, normal bowel sounds Central nervous system: Unable to assess orientation Extremities: Unable to assess power Skin: No rashes, lesions or ulcers Psychiatry: Unable to assess    Data Reviewed: I have personally reviewed following labs and imaging studies  CBC: Recent Labs  Lab 06/29/23 1447  WBC 8.3  HGB 15.6  HCT 47.3  MCV 89.8  PLT 628*   Basic Metabolic Panel: Recent Labs  Lab 06/29/23 1447 06/30/23 0422  NA 134* 137  K 3.7 3.4*  CL 101 104  CO2 19* 23  GLUCOSE 104* 106*  BUN 17 16  CREATININE 0.58* 0.62  CALCIUM 9.0 8.6*   GFR: CrCl cannot be calculated (Unknown ideal weight.). Liver Function Tests: Recent Labs  Lab 06/29/23 1447  AST 21  ALT 10  ALKPHOS 47  BILITOT 1.1  PROT 7.5  ALBUMIN 4.5   No results for input(s): LIPASE, AMYLASE in the last 168 hours. No results for input(s): AMMONIA in the last 168 hours. Coagulation  Profile: No results for input(s): INR, PROTIME in the last 168 hours. Cardiac Enzymes: No results for input(s): CKTOTAL, CKMB, CKMBINDEX, TROPONINI in the last 168 hours. BNP (last 3 results) No results for input(s): PROBNP in the last 8760 hours. HbA1C: No results for input(s): HGBA1C in the last 72 hours. CBG: Recent Labs  Lab 06/29/23 1858  GLUCAP 96    Lipid Profile: No results for input(s): CHOL, HDL, LDLCALC, TRIG, CHOLHDL, LDLDIRECT in the last 72 hours. Thyroid Function Tests: Recent Labs    06/30/23 0229  TSH 1.036   Anemia Panel: Recent Labs    06/30/23 0229  VITAMINB12 319   Sepsis Labs: No results for input(s): PROCALCITON, LATICACIDVEN in the last 168 hours.  Recent Results (from the past 240 hours)  SARS Coronavirus 2 by RT PCR (hospital order, performed in [facility] hospital lab) *cepheid single result test* Anterior Nasal Swab     Status: None   Collection Time: 06/30/23  9:06 AM   Specimen: Anterior Nasal Swab  Result Value Ref Range Status   SARS Coronavirus 2 by RT PCR NEGATIVE NEGATIVE Final    Comment: (NOTE) SARS-CoV-2 target nucleic acids are NOT DETECTED.  The SARS-CoV-2 RNA is generally detectable in upper and lower respiratory specimens during the acute phase of infection. The lowest concentration of SARS-CoV-2 viral copies this assay can detect is 250 copies / mL. A negative result does not preclude SARS-CoV-2 infection and should not be used as the sole basis for treatment or other patient management decisions.  A negative result may occur with improper specimen collection / handling, submission of specimen other than nasopharyngeal swab, presence of viral mutation(s) within the areas targeted by this assay, and inadequate number of viral copies (<250 copies / mL). A negative result must be combined with clinical observations, patient history, and epidemiological information.  Fact Sheet for Patients:   roadlaptop.co.za  Fact Sheet for Healthcare Providers: http://kim-miller.com/  This test is not yet approved or  cleared by the [place]  FDA and has been authorized for detection and/or diagnosis of SARS-CoV-2 by FDA under an Emergency Use Authorization (EUA).  This EUA will remain in effect (meaning this test can be used)  for the duration of the COVID-19 declaration under Section 564(b)(1) of the Act, 21 U.S.C. section 360bbb-3(b)(1), unless the authorization is terminated or revoked sooner.  Performed at [facility], [address], [place], [place] [address]          Radiology Studies: CT Head Wo Contrast Result Date: 06/29/2023 CLINICAL DATA:  Delirium.  Altered mental status. EXAM: CT HEAD WITHOUT CONTRAST TECHNIQUE: Contiguous axial images were obtained from the base of the skull through the vertex without intravenous contrast. RADIATION DOSE REDUCTION: This exam was performed according to the departmental dose-optimization program which includes automated exposure control, adjustment of the mA and/or kV according to patient size and/or use of iterative reconstruction technique. COMPARISON:  Head CT 03/26/2023 FINDINGS: Brain: Streak artifact from a left-sided cochlear implant limits assessment, predominantly of the posterior left cerebral hemisphere. Within this limitation, no acute infarct, intracranial hemorrhage, mass, midline shift, or extra-axial fluid collection is identified. There is mild cerebral atrophy. Cerebral white matter hypodensities are similar to the prior CT and are nonspecific but compatible with mild chronic small vessel ischemic disease. 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 Vascular: No hyperdense vessel. Skull: No acute fracture or suspicious osseous lesion. 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 Similar partial opacification of residual left mastoid air cells. Clear right mastoid air cells. Minimal mucosal thickening in the included paranasal sinuses. Unremarkable included orbits. Other: None. IMPRESSION: 1. No evidence of acute intracranial abnormality. 2. Mild chronic small vessel ischemic disease. Electronically Signed   By: Dasie Hamburg M.D.   On: 06/29/2023 17:24        Scheduled Meds:   carbidopa -levodopa   2 tablet Oral TID   cyanocobalamin   1,000 mcg Oral Daily   enoxaparin  (LOVENOX ) injection  40 mg Subcutaneous Q24H   escitalopram   5 mg Oral Daily   gabapentin   100 mg Oral BID   latanoprost   1 drop Both Eyes QHS   Continuous Infusions:   LOS: 0 days      Calvin KATHEE Robson, MD [facility]   If 7PM-7AM, please contact night-coverage  06/30/2023, 2:02 PM

## 2023-06-30 NOTE — Evaluation (Signed)
 Physical Therapy Evaluation Patient Details Name: Justin Bird MRN: [id number] DOB: 1940-12-03 Today's Date: 06/30/2023  History of Present Illness  Pt is an 83 y/o M admitted on 06/29/23 after presenting with AMS. Head CT negative for acute issues (unable to obtain MRI 2/2 cochlear implant). PMH: HOH with cochlear implant, depression, Parkinson's disease  Clinical Impression  Pt seen for PT evaluation with co-tx with OT. Per chart, pt was living alone, AxOx2 (person, place) at baseline. On this date, pt very lethargic, requires max stimulation to open eyes, does not follow commands. Pt clenching fists at times (when PT attempts to untangle SpO2 cord). Pt does not initiate sipping from straw, instead, biting it. Pt requires +2 for supine<>sit & repositioning in bed, max assist for static sitting EOB. Will continue to follow pt acutely to progress mobility as able.        If plan is discharge home, recommend the following: Two people to help with walking and/or transfers;Two people to help with bathing/dressing/bathroom;Direct supervision/assist for medications management;Help with stairs or ramp for entrance;Assist for transportation;Assistance with feeding;Direct supervision/assist for financial management;Assistance with cooking/housework;Supervision due to cognitive status   Can travel by private vehicle   No    Equipment Recommendations Other (comment) (defer to next venue)  Recommendations for Other Services       Functional Status Assessment Patient has had a recent decline in their functional status and demonstrates the ability to make significant improvements in function in a reasonable and predictable amount of time.     Precautions / Restrictions Precautions Precautions: Fall Restrictions Weight Bearing Restrictions Per Provider Order: No      Mobility  Bed Mobility Overal bed mobility: Needs Assistance Bed Mobility: Supine to Sit, Sit to Supine     Supine to sit:  Total assist, +2 for physical assistance, +2 for safety/equipment, HOB elevated Sit to supine: Total assist, +2 for physical assistance, +2 for safety/equipment, HOB elevated        Transfers                        Ambulation/Gait                  Stairs            Wheelchair Mobility     Tilt Bed    Modified Rankin (Stroke Patients Only)       Balance Overall balance assessment: Needs assistance Sitting-balance support: Bilateral upper extremity supported, Feet unsupported Sitting balance-Leahy Scale: Zero                                       Pertinent Vitals/Pain Pain Assessment Pain Assessment: PAINAD Breathing: normal Negative Vocalization: occasional moan/groan, low speech, negative/disapproving quality Facial Expression: smiling or inexpressive Body Language: rigid, fists clenched, knees up, pushing/pulling away, strikes out (occasionally throughout session pt clencing fists) Consolability: distracted or reassured by voice/touch PAINAD Score: 4    Home Living Family/patient expects to be discharged to:: Private residence Living Arrangements: Alone                 Additional Comments: Pt lethargic t/o session. No family/caregiver available to confirm PLOF or home set up. Per chart, pt living at home alone, adult children [place] of state.    Prior Function Prior Level of Function : Patient poor historian/Family not available  ADLs Comments: Per chart, pt generally oriented to self and place. Lives alone, spouse is in assisted living facility/SNF     Extremity/Trunk Assessment   Upper Extremity Assessment Upper Extremity Assessment: Difficult to assess due to impaired cognition    Lower Extremity Assessment Lower Extremity Assessment: Difficult to assess due to impaired cognition       Communication   Communication Communication: Difficulty following commands/understanding;Difficulty  communicating thoughts/reduced clarity of speech;Hearing impairment Following commands:  (does not follow commands) Cueing Techniques: Verbal cues;Gestural cues;Tactile cues;Visual cues  Cognition Arousal: Lethargic   Overall Cognitive Status: No family/caregiver present to determine baseline cognitive functioning Area of Impairment: Orientation, Following commands, Safety/judgement, Attention, Memory, Awareness, Problem solving                 Orientation Level: Disoriented to, Person, Place, Time, Situation             General Comments: Pt lethargic, briefly opens eyes despite PT/OT repositioning pt, assistin him with sitting on EOB, placing cold, wet cloth on his forehead. Immediately returns to sleep once assisted in bed. Pt clencing fists at times, especially when PT attempts to untangle SpO2 line. Attempted to assist pt with drinking orange juice but pt not initiating sipping from straw, instead biting straw.        General Comments      Exercises     Assessment/Plan    PT Assessment Patient needs continued PT services  PT Problem List Decreased strength;Decreased coordination;Cardiopulmonary status limiting activity;Decreased range of motion;Decreased cognition;Decreased activity tolerance;Decreased balance;Decreased safety awareness;Decreased mobility;Decreased knowledge of precautions;Decreased knowledge of use of DME       PT Treatment Interventions DME instruction;Therapeutic exercise;Balance training;Gait training;Stair training;Neuromuscular re-education;Functional mobility training;Patient/family education;Therapeutic activities;Cognitive remediation;Modalities;Wheelchair mobility training;Manual techniques    PT Goals (Current goals can be found in the Care Plan section)  Acute Rehab PT Goals PT Goal Formulation: Patient unable to participate in goal setting Time For Goal Achievement: 07/14/23 Potential to Achieve Goals: Fair    Frequency Min 1X/week      Co-evaluation PT/OT/SLP Co-Evaluation/Treatment: Yes Reason for Co-Treatment: For patient/therapist safety;Necessary to address cognition/behavior during functional activity;Complexity of the patient's impairments (multi-system involvement) PT goals addressed during session: Mobility/safety with mobility;Balance         AM-PAC PT 6 Clicks Mobility  Outcome Measure Help needed turning from your back to your side while in a flat bed without using bedrails?: Total Help needed moving from lying on your back to sitting on the side of a flat bed without using bedrails?: Total Help needed moving to and from a bed to a chair (including a wheelchair)?: Total Help needed standing up from a chair using your arms (e.g., wheelchair or bedside chair)?: Total Help needed to walk in hospital room?: Total Help needed climbing 3-5 steps with a railing? : Total 6 Click Score: 6    End of Session   Activity Tolerance: Patient limited by lethargy (limited 2/2 impaired cognition) Patient left: in bed;with call bell/phone within reach;with nursing/sitter in room (BUE mittens donned) Nurse Communication: Mobility status PT Visit Diagnosis: Muscle weakness (generalized) (M62.81);Other abnormalities of gait and mobility (R26.89);Difficulty in walking, not elsewhere classified (R26.2)    Time: 9090-9078 PT Time Calculation (min) (ACUTE ONLY): 12 min   Charges:   PT Evaluation $PT Eval Moderate Complexity: 1 Mod   PT General Charges $$ ACUTE PT VISIT: 1 Visit         Richerd Pinal, PT, DPT 06/30/23, 12:34 PM   Soliyana Mcchristian  CHRISTELLA Pinal 06/30/2023, 12:32 PM

## 2023-06-30 NOTE — NC FL2 (Signed)
 [place]  MEDICAID [id number] LEVEL OF CARE FORM     IDENTIFICATION  Patient Name: Justin Bird Birthdate: Nov 29, 1940 Sex: male Admission Date (Current Location): 06/29/2023  [facility] and [place] Number:  Chiropodist and Address:  [facility], [address], [place], [place] [address]      Provider Number: [id number]  Attending Physician Name and Address:  Jhonny Calvin NOVAK, MD  Relative Name and Phone Number:  Maaz, Spiering)  [phone] [facility])    Current Level of Care: Hospital Recommended Level of Care: Skilled Nursing Facility Prior Approval Number:    Date Approved/Denied:   PASRR Number: *PENDING*  Discharge Plan:      Current Diagnoses: Patient Active Problem List   Diagnosis Date Noted   Acute metabolic encephalopathy 06/29/2023   Parkinson's disease (HCC)     Orientation RESPIRATION BLADDER Height & Weight        Normal   Weight:   Height:     BEHAVIORAL SYMPTOMS/MOOD NEUROLOGICAL BOWEL NUTRITION STATUS        Diet (regular)  AMBULATORY STATUS COMMUNICATION OF NEEDS Skin   Total Care Verbally (delayed response) Normal                       Personal Care Assistance Level of Assistance  Bathing, Feeding, Dressing Bathing Assistance: Maximum assistance Feeding assistance: Maximum assistance Dressing Assistance: Maximum assistance     Functional Limitations Info             SPECIAL CARE FACTORS FREQUENCY  PT (By licensed PT), OT (By licensed OT)     PT Frequency: 5 times per week OT Frequency: 5 times per week            Contractures      Additional Factors Info  Code Status, Allergies Code Status Info: full Allergies Info: Opium Poppy (Papaver), Penicillins, Sulfa Antibiotics, Penicillins, Sulfa Antibiotics           Current Medications (06/30/2023):  This is the current hospital active medication list Current Facility-Administered Medications  Medication Dose Route Frequency  Provider Last Rate Last Admin   acetaminophen  (TYLENOL ) tablet 650 mg  650 mg Oral Q6H PRN Niu, Xilin, MD   325 mg at 06/29/23 2127   carbidopa -levodopa  (SINEMET  IR) 25-100 MG per tablet immediate release 2 tablet  2 tablet Oral TID Goodman, Graydon, MD   2 tablet at 06/29/23 1833   cyanocobalamin  (VITAMIN B12) tablet 1,000 mcg  1,000 mcg Oral Daily Niu, Xilin, MD       enoxaparin  (LOVENOX ) injection 40 mg  40 mg Subcutaneous Q24H Niu, Xilin, MD   40 mg at 06/29/23 2126   escitalopram  (LEXAPRO ) tablet 5 mg  5 mg Oral Daily Niu, Xilin, MD       gabapentin  (NEURONTIN ) capsule 100 mg  100 mg Oral BID Niu, Xilin, MD   100 mg at 06/30/23 0143   hydrALAZINE  (APRESOLINE ) injection 5 mg  5 mg Intravenous Q2H PRN Niu, Xilin, MD       latanoprost  (XALATAN ) 0.005 % ophthalmic solution 1 drop  1 drop Both Eyes QHS Niu, Xilin, MD       ondansetron  (ZOFRAN ) injection 4 mg  4 mg Intravenous Q8H PRN Niu, Xilin, MD       ziprasidone  (GEODON ) injection 10 mg  10 mg Intramuscular Q4H PRN Jhonny Calvin B, MD   10 mg at 06/30/23 1551   Current Outpatient Medications  Medication Sig Dispense Refill   carbidopa -levodopa  (SINEMET   IR) 25-100 MG tablet Take 2 tablets by mouth 3 (three) times daily.     cetirizine (ZYRTEC) 10 MG tablet Take 10 mg by mouth daily.     Cholecalciferol (VITAMIN D -3) 25 MCG (1000 UT) CAPS Take by mouth.     cyanocobalamin  (VITAMIN B12) 1000 MCG tablet Take 1,000 mcg by mouth daily.     escitalopram  (LEXAPRO ) 5 MG tablet Take 5 mg by mouth daily.     gabapentin  (NEURONTIN ) 100 MG capsule Take 100 mg by mouth 3 (three) times daily.     latanoprost  (XALATAN ) 0.005 % ophthalmic solution Place 1 drop into both eyes at bedtime.       Discharge Medications: Please see discharge summary for a list of discharge medications.  Relevant Imaging Results:  Relevant Lab Results:   Additional Information SS #: [id number] - Patient's wife lives at [facility].  Clint Strupp E Laveyah Oriol,  LCSW

## 2023-06-30 NOTE — ED Notes (Signed)
 Patient will not wake up to take po meds, drink water or eat breakfast. Will attempt again, MD Sreenath made aware.

## 2023-06-30 NOTE — ED Notes (Signed)
 Updated patient's son [name] via phone; all questions answered.

## 2023-06-30 NOTE — ED Notes (Signed)
 Patient is not awake and agitated, caregiver at bedside but patient is not cooperative; attempting to pull off mittens and wanting to get out of bed. Patient refusing food, water  and po meds. Dr. Jhonny made aware and will continue to re-direct patient until new orders are given.

## 2023-06-30 NOTE — TOC Initial Note (Addendum)
 Transition of Care [facility]) - Initial/Assessment Note    Patient Details  Name: Justin Bird MRN: [id number] Date of Birth: [birth year]  [facility]) CM/SW Contact:    Justin Bird Justin [name], LCSW Phone Number: 06/30/2023, 3:57 PM  Clinical Narrative:                 CSW spoke with patient's son [name]) due to patient being disoriented. Patient is from home alone. His wife moved to [facility] long term care in August.  PCP is Dr. Glover. Patient uses a RW at baseline.  Justin Bird states he is agreeable to PT rec for SNF. He prefers [facility] if possible since patient's wife lives there. Justin Bird states they are trying to determine a plan for LTC for patient after he finishes rehab, they have gotten him approved for a income based senior living apartment, but think he may need ALF or SNF. He does not currently have Medicaid per Justin Bird Justin Justin Bird agreeable to a referral for Financial Counseling. Referral sent to [facility]. SNF work up started. PASRR IS PENDING, information has been uploaded.  Expected Discharge Plan: Skilled Nursing Facility Barriers to Discharge: Continued Medical Work up   Patient Goals and CMS Choice Patient states their goals for this hospitalization and ongoing recovery are:: STR CMS Medicare.gov Compare Post Acute Care list provided to:: Patient Represenative (must comment) Choice offered to / list presented to : Adult Children      Expected Discharge Plan and Services       Living arrangements for the past 2 months: Single Family Home                                      Prior Living Arrangements/Services Living arrangements for the past 2 months: Single Family Home Lives with:: Self Patient language and need for interpreter reviewed:: Yes Do you feel safe going back to the place where you live?: Yes      Need for Family Participation in Patient Care: Yes (Comment) Care giver support system in place?: Yes (comment) Current home services:  DME Criminal Activity/Legal Involvement Pertinent to Current Situation/Hospitalization: No - Comment as needed  Activities of Daily Living      Permission Sought/Granted Permission sought to share information with : Facility Industrial/product Designer granted to share information with : Yes, Verbal Permission Granted (by son Justin Bird)     Permission granted to share info w AGENCY: SNFs, Financial Counseling        Emotional Assessment       Orientation: : Fluctuating Orientation (Suspected and/or reported Sundowners) Alcohol  / Substance Use: Not Applicable Psych Involvement: No (comment)  Admission diagnosis:  Acute metabolic encephalopathy [G93.41] Patient Active Problem List   Diagnosis Date Noted   Acute metabolic encephalopathy 06/29/2023   Parkinson's disease (HCC)    PCP:  Justin Lenis, MD Pharmacy:   [facility] [phone] Justin Bird, [place] - [address] [place] [address] Phone: [phone] Fax: [phone]     Social Drivers of Health (SDOH) Social History: SDOH Screenings   Food Insecurity: Patient Declined (01/04/2023)   Received from [facility]  Housing: Patient Declined (01/04/2023)   Received from [facility]  Transportation Needs: Patient Declined (01/04/2023)   Received from [facility]  Utilities: Patient Declined (01/04/2023)   Received from [facility]  Financial Resource Strain: Patient Declined (01/04/2023)  Received from [facility]  Tobacco Use: Low Risk  (06/30/2023)   SDOH Interventions:     Readmission Risk Interventions     No data to display

## 2023-06-30 NOTE — ED Notes (Signed)
 Patient will not wake up enough to tolerate medications or water.

## 2023-06-30 NOTE — ED Notes (Signed)
 This RN to bedside to introduce self to pt. Pt has 1:1 sitter. Pt is restless at this time. Will advised MD on meds to admin to help. Pt is wearing mitts and trying to pull them off.

## 2023-06-30 NOTE — ED Notes (Signed)
 This tech assisted pt in eating at this time.

## 2023-07-01 DIAGNOSIS — G9341 Metabolic encephalopathy: Secondary | ICD-10-CM | POA: Diagnosis not present

## 2023-07-01 LAB — PROCALCITONIN: Procalcitonin: <0.1 ng/mL

## 2023-07-01 MED ORDER — TRAZODONE HCL 50 MG PO TABS
150.0000 mg | ORAL_TABLET | Freq: Every day | ORAL | Status: DC
  Administered 2023-07-01 – 2023-08-02 (×31): 150 mg via ORAL
  Filled 2023-07-01 (×32): qty 3

## 2023-07-01 MED ORDER — QUETIAPINE FUMARATE 25 MG PO TABS
25.0000 mg | ORAL_TABLET | Freq: Three times a day (TID) | ORAL | Status: DC
  Administered 2023-07-01 – 2023-07-03 (×6): 25 mg via ORAL
  Filled 2023-07-01 (×6): qty 1

## 2023-07-01 NOTE — Plan of Care (Signed)
  Problem: Safety: Goal: Non-violent Restraint(s) Outcome: Progressing   Problem: Education: Goal: Knowledge of General Education information will improve Description: Including pain rating scale, medication(s)/side effects and non-pharmacologic comfort measures Outcome: Progressing   Problem: Activity: Goal: Risk for activity intolerance will decrease Outcome: Progressing   Problem: Coping: Goal: Level of anxiety will decrease Outcome: Progressing   Problem: Elimination: Goal: Will not experience complications related to bowel motility Outcome: Progressing   Problem: Pain Management: Goal: General experience of comfort will improve Outcome: Progressing   Problem: Safety: Goal: Ability to remain free from injury will improve Outcome: Progressing   Problem: Skin Integrity: Goal: Risk for impaired skin integrity will decrease Outcome: Progressing

## 2023-07-01 NOTE — Progress Notes (Signed)
 PROGRESS NOTE    Justin Bird  FMW:[id number] DOB: [birth year] DOA: 06/29/2023 PCP: [name], MD    Brief Narrative:   83 y.o. male with medical history significant of hard of hearing, depression, Parkinson's disease, allergy, who presents with altered mental status.   Patient has AMS with agitation, is unable to provide any medical history.  I called her daughter by phone, who provided most of the medical history. Per her daughter, at her normal baseline, patient is oriented to person and place, usually is confused about the time.  In the past 4 days, patient has been confused.  Patient is agitated, yelling around the ED.  He moves all extremities, kicking around.  No active nausea, vomiting, diarrhea noted.  No fever.  No respiratory distress, active cough noted.  Does not seem to have chest pain or abdominal pain.  Patient possibly missed some dose of Sinemet .   Assessment & Plan:   Principal Problem:   Acute metabolic encephalopathy Active Problems:   Parkinson's disease (HCC)   Acute metabolic encephalopathy: Etiology is not clear.  CT head negative for acute intracranial abnormalities.  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  Potential differential diagnosis to include early stage of dementia, delirium, vitamin B12 deficiency and abnormal thyroid function.  B12 and TSH within normal limits.  Patient has possibly missed some dose of Sinemet , which may have contributed partially due to withdrawal.  Patient received IV Ativan , IV Haldol , IM Zyprexa  in ED.   Psychiatry engaged 11/4.  Pending official consultation and recommendations.  Patient's current presentation is not entirely consistent with missed Sinemet  doses.  This likely would present as worsening of underlying Parkinson symptoms.  No clear metabolic cause for encephalopathy has been identified.  Unable to exclude progression of underlying cognitive decline.  Plan: Fall precautions Administer  Sinemet  when safely possible IM Geodon  for agitation Avoid benzodiazepines Follow psychiatry recommendations  Parkinson's disease (HCC) Administer Sinemet  when safely possible     DVT prophylaxis: SQ Lovenox  Code Status: Full Family Communication:Son Ryan [phone] on 1/3 Disposition Plan: Status is: Inpatient Remains inpatient appropriate because: Acute metabolic encephalopathy   Level of care: Med-Surg  Consultants:  Psychiatry  Procedures:  None  Antimicrobials: None    Subjective: Seen and examined.  Unable to provide history.  Objective: Vitals:   06/30/23 2042 06/30/23 2129 07/01/23 0510 07/01/23 0815  BP: 111/61 (!) 146/74 (!) 105/55 138/65  Pulse: 73 100 69 89  Resp: 15 20 20 18   Temp: 97.7 F (36.5 C) 98.5 F (36.9 C) 97.9 F (36.6 C) 97.7 F (36.5 C)  TempSrc: Oral Axillary Axillary Oral  SpO2: 95% 96% 94% 96%   No intake or output data in the 24 hours ending 07/01/23 1122 There were no vitals filed for this visit.  Examination:  General exam: Lethargic.  Unable to provide history Respiratory system: Bibasilar crackles.  Normal work of breathing.  Room air Cardiovascular system: S1-S2, RRR, no murmurs, no pedal edema Gastrointestinal system: Soft,/ND, normal bowel sounds Central nervous system: Unable to assess orientation Extremities: Unable to assess power Skin: No rashes, lesions or ulcers Psychiatry: Unable to assess    Data Reviewed: I have personally reviewed following labs and imaging studies  CBC: Recent Labs  Lab 06/29/23 1447  WBC 8.3  HGB 15.6  HCT 47.3  MCV 89.8  PLT 628*   Basic Metabolic Panel: Recent Labs  Lab 06/29/23 1447 06/30/23 0422  NA 134* 137  K 3.7 3.4*  CL  101 104  CO2 19* 23  GLUCOSE 104* 106*  BUN 17 16  CREATININE 0.58* 0.62  CALCIUM 9.0 8.6*   GFR: CrCl cannot be calculated (Unknown ideal weight.). Liver Function Tests: Recent Labs  Lab 06/29/23 1447  AST 21  ALT 10  ALKPHOS 47   BILITOT 1.1  PROT 7.5  ALBUMIN 4.5   No results for input(s): LIPASE, AMYLASE in the last 168 hours. No results for input(s): AMMONIA in the last 168 hours. Coagulation Profile: No results for input(s): INR, PROTIME in the last 168 hours. Cardiac Enzymes: No results for input(s): CKTOTAL, CKMB, CKMBINDEX, TROPONINI in the last 168 hours. BNP (last 3 results) No results for input(s): PROBNP in the last 8760 hours. HbA1C: No results for input(s): HGBA1C in the last 72 hours. CBG: Recent Labs  Lab 06/29/23 1858  GLUCAP 96   Lipid Profile: No results for input(s): CHOL, HDL, LDLCALC, TRIG, CHOLHDL, LDLDIRECT in the last 72 hours. Thyroid Function Tests: Recent Labs    06/30/23 0229  TSH 1.036   Anemia Panel: Recent Labs    06/30/23 0229  VITAMINB12 319   Sepsis Labs: No results for input(s): PROCALCITON, LATICACIDVEN in the last 168 hours.  Recent Results (from the past 240 hours)  SARS Coronavirus 2 by RT PCR (hospital order, performed in [facility] hospital lab) *cepheid single result test* Anterior Nasal Swab     Status: None   Collection Time: 06/30/23  9:06 AM   Specimen: Anterior Nasal Swab  Result Value Ref Range Status   SARS Coronavirus 2 by RT PCR NEGATIVE NEGATIVE Final    Comment: (NOTE) SARS-CoV-2 target nucleic acids are NOT DETECTED.  The SARS-CoV-2 RNA is generally detectable in upper and lower respiratory specimens during the acute phase of infection. The lowest concentration of SARS-CoV-2 viral copies this assay can detect is 250 copies / mL. A negative result does not preclude SARS-CoV-2 infection and should not be used as the sole basis for treatment or other patient management decisions.  A negative result may occur with improper specimen collection / handling, submission of specimen other than nasopharyngeal swab, presence of viral mutation(s) within the areas targeted by this assay, and inadequate number  of viral copies (<250 copies / mL). A negative result must be combined with clinical observations, patient history, and epidemiological information.  Fact Sheet for Patients:   roadlaptop.co.za  Fact Sheet for Healthcare Providers: http://kim-miller.com/  This test is not yet approved or  cleared by the [place]  FDA and has been authorized for detection and/or diagnosis of SARS-CoV-2 by FDA under an Emergency Use Authorization (EUA).  This EUA will remain in effect (meaning this test can be used) for the duration of the COVID-19 declaration under Section 564(b)(1) of the Act, 21 U.S.C. section 360bbb-3(b)(1), unless the authorization is terminated or revoked sooner.  Performed at [facility], [address] [place], [place] [address]          Radiology Studies: CT Head Wo Contrast Result Date: 06/29/2023 CLINICAL DATA:  Delirium.  Altered mental status. EXAM: CT HEAD WITHOUT CONTRAST TECHNIQUE: Contiguous axial images were obtained from the base of the skull through the vertex without intravenous contrast. RADIATION DOSE REDUCTION: This exam was performed according to the departmental dose-optimization program which includes automated exposure control, adjustment of the mA and/or kV according to patient size and/or use of iterative reconstruction technique. COMPARISON:  Head CT 03/26/2023 FINDINGS: Brain: Streak artifact from a left-sided cochlear implant limits assessment, predominantly of  the posterior left cerebral hemisphere. Within this limitation, no acute infarct, intracranial hemorrhage, mass, midline shift, or extra-axial fluid collection is identified. There is mild cerebral atrophy. Cerebral white matter hypodensities are similar to the prior CT and are nonspecific but compatible with mild chronic small vessel ischemic disease. 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  Vascular: No hyperdense vessel. Skull: No acute fracture or suspicious osseous lesion. Sinuses/Orbits: Left mastoidectomy with cochlear implant in place. Similar partial opacification of residual left mastoid air cells. Clear right mastoid air cells. Minimal mucosal thickening in the included paranasal sinuses. Unremarkable included orbits. Other: None. IMPRESSION: 1. No evidence of acute intracranial abnormality. 2. Mild chronic small vessel ischemic disease. Electronically Signed   By: Dasie Hamburg M.D.   On: 06/29/2023 17:24        Scheduled Meds:  carbidopa -levodopa   2 tablet Oral TID   cyanocobalamin   1,000 mcg Oral Daily   enoxaparin  (LOVENOX ) injection  40 mg Subcutaneous Q24H   escitalopram   5 mg Oral Daily   gabapentin   100 mg Oral BID   latanoprost   1 drop Both Eyes QHS   Continuous Infusions:   LOS: 1 day      Calvin KATHEE Robson, MD [facility]   If 7PM-7AM, please contact night-coverage  07/01/2023, 11:22 AM

## 2023-07-01 NOTE — Progress Notes (Addendum)
 Patient brought to floor on bed by nurse and tec, with bilateral mitten, skin intact. Iv intact. No respiratory distress noted. Condom cath in place, patient made comfortable in bed cannot re orientated, patient is  restless and trying to pulls off everything off him. PRN will be given. No family member around. Unable to complete admission questions due to AMS pt does not answer question when asked.

## 2023-07-01 NOTE — Consult Note (Signed)
 [facility] Face-to-Face Psychiatry Consult   Reason for Consult: Altered mental status Referring Physician:  Jhonny Calvin NOVAK, MD  Patient Identification: Justin Bird MRN:  [id number] Principal Diagnosis: Acute metabolic encephalopathy Diagnosis:  Principal Problem:   Acute metabolic encephalopathy Active Problems:   Parkinson's disease (HCC)   Total Time spent with patient: 20 minutes  Subjective:   Justin Bird is a 83 y.o. male patient admitted with altered mental status, confusion, and agitation.  HPI: Unable to obtain history from the patient so the chart is reviewed and the most recent note is as follows: 83 y.o. male with medical history significant of hard of hearing, depression, Parkinson's disease, who presents with altered mental status. In the past 4 days, patient has been confused.  Patient is agitated, yelling around the ED.  He moves all extremities, kicking around.  No active nausea, vomiting, diarrhea noted.  No fever.  No respiratory distress, active cough noted.  Does not seem to have chest pain or abdominal pain.  Patient possibly missed some dose of Sinemet .     Assessment & Plan:   Principal Problem:   Acute metabolic encephalopathy Active Problems:   Parkinson's disease (HCC)    Acute metabolic encephalopathy: Etiology is not clear.  CT head negative for acute intracranial abnormalities.  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  Potential differential diagnosis to include early stage of dementia, delirium, vitamin B12 deficiency and abnormal thyroid function.  B12 and TSH within normal limits.  Patient has possibly missed some dose of Sinemet , which may have contributed partially due to withdrawal.  Patient received IV Ativan , IV Haldol , IM Zyprexa  in ED.    Psychiatry engaged 11/4.  Pending official consultation and recommendations.  Patient's current presentation is not entirely consistent with missed Sinemet  doses.  This likely  would present as worsening of underlying Parkinson symptoms.  No clear metabolic cause for encephalopathy has been identified.  Unable to exclude progression of underlying cognitive decline.  Past Psychiatric History: Chart reviewed which states that he has a history of depression which is most likely due to his Parkinson's.  Risk to Self:   Risk to Others:   Prior Inpatient Therapy:   Prior Outpatient Therapy:    Past Medical History:  Past Medical History:  Diagnosis Date   Allergy    Hearing deficit    Parkinson's disease (HCC)     Past Surgical History:  Procedure Laterality Date   COCHLEAR IMPLANT Left    PARTIAL COLECTOMY     Family History:  Family History  Problem Relation Age of Onset   Lung cancer Mother    Family Psychiatric  History: Unremarkable Social History:  Social History   Substance and Sexual Activity  Alcohol  Use Not Currently     Social History   Substance and Sexual Activity  Drug Use Never    Social History   Socioeconomic History   Marital status: Married    Spouse name: Not on file   Number of children: Not on file   Years of education: Not on file   Highest education level: Not on file  Occupational History   Not on file  Tobacco Use   Smoking status: Never   Smokeless tobacco: Never  Vaping Use   Vaping status: Never Used  Substance and Sexual Activity   Alcohol  use: Not Currently   Drug use: Never   Sexual activity: Not on file  Other Topics Concern   Not on file  Social History  Narrative   ** Merged History Encounter **       Social Drivers of Health   Financial Resource Strain: Patient Declined (01/04/2023)   Received from [facility]   Overall Financial Resource Strain (CARDIA)    Difficulty of Paying Living Expenses: Patient declined  Food Insecurity: Patient Declined (01/04/2023)   Received from [facility]   Hunger Vital Sign    Worried About Running Out of Food in the Last  Year: Patient declined    Ran Out of Food in the Last Year: Patient declined  Transportation Needs: No Transportation Needs (07/01/2023)   PRAPARE - Administrator, Civil Service (Medical): No    Lack of Transportation (Non-Medical): No  Physical Activity: Not on file  Stress: Not on file  Social Connections: Not on file   Additional Social History:    Allergies:   Allergies  Allergen Reactions   Opium Poppy [Papaver] Anaphylaxis and Hives   Penicillins Anaphylaxis and Rash   Sulfa Antibiotics Anaphylaxis   Penicillins    Sulfa Antibiotics     Labs:  Results for orders placed or performed during the hospital encounter of 06/29/23 (from the past 48 hours)  Comprehensive metabolic panel     Status: Abnormal   Collection Time: 06/29/23  2:47 PM  Result Value Ref Range   Sodium 134 (L) 135 - 145 mmol/L   Potassium 3.7 3.5 - 5.1 mmol/L   Chloride 101 98 - 111 mmol/L   CO2 19 (L) 22 - 32 mmol/L   Glucose, Bld 104 (H) 70 - 99 mg/dL    Comment: Glucose reference range applies only to samples taken after fasting for at least 8 hours.   BUN 17 8 - 23 mg/dL   Creatinine, Ser 9.41 (L) 0.61 - 1.24 mg/dL   Calcium 9.0 8.9 - 89.6 mg/dL   Total Protein 7.5 6.5 - 8.1 g/dL   Albumin 4.5 3.5 - 5.0 g/dL   AST 21 15 - 41 U/L   ALT 10 0 - 44 U/L   Alkaline Phosphatase 47 38 - 126 U/L   Total Bilirubin 1.1 0.0 - 1.2 mg/dL   GFR, Estimated >39 >39 mL/min    Comment: (NOTE) Calculated using the CKD-EPI Creatinine Equation (2021)    Anion gap 14 5 - 15    Comment: Performed at [facility], [address] [place], [place] [address]  CBC     Status: Abnormal   Collection Time: 06/29/23  2:47 PM  Result Value Ref Range   WBC 8.3 4.0 - 10.5 K/uL   RBC 5.27 4.22 - 5.81 MIL/uL   Hemoglobin 15.6 13.0 - 17.0 g/dL   HCT 52.6 60.9 - 47.9 %   MCV 89.8 80.0 - 100.0 fL   MCH 29.6 26.0 - 34.0 pg   MCHC 33.0 30.0 - 36.0 g/dL   RDW 86.7 88.4 - 84.4 %   Platelets 628 (H) 150 -  400 K/uL   nRBC 0.0 0.0 - 0.2 %    Comment: Performed at [facility], [address] [place], [place] [address]  CBG monitoring, ED     Status: None   Collection Time: 06/29/23  6:58 PM  Result Value Ref Range   Glucose-Capillary 96 70 - 99 mg/dL    Comment: Glucose reference range applies only to samples taken after fasting for at least 8 hours.  Urinalysis, w/ Reflex to Culture (Infection Suspected) -Urine, Clean Catch  Status: Abnormal   Collection Time: 06/29/23  7:02 PM  Result Value Ref Range   Specimen Source URINE, CLEAN CATCH    Color, Urine YELLOW (A) YELLOW   APPearance CLEAR (A) CLEAR   Specific Gravity, Urine 1.019 1.005 - 1.030   pH 6.0 5.0 - 8.0   Glucose, UA NEGATIVE NEGATIVE mg/dL   Hgb urine dipstick NEGATIVE NEGATIVE   Bilirubin Urine NEGATIVE NEGATIVE   Ketones, ur 20 (A) NEGATIVE mg/dL   Protein, ur NEGATIVE NEGATIVE mg/dL   Nitrite NEGATIVE NEGATIVE   Leukocytes,Ua NEGATIVE NEGATIVE   RBC / HPF 0-5 0 - 5 RBC/hpf   WBC, UA 0-5 0 - 5 WBC/hpf    Comment:        Reflex urine culture not performed if WBC <=10, OR if Squamous epithelial cells >5. If Squamous epithelial cells >5 suggest recollection.    Bacteria, UA NONE SEEN NONE SEEN   Squamous Epithelial / HPF 0-5 0 - 5 /HPF   Mucus PRESENT     Comment: Performed at [facility], [address] [place], [place] [address]  Vitamin B12     Status: None   Collection Time: 06/30/23  2:29 AM  Result Value Ref Range   Vitamin B-12 319 180 - 914 pg/mL    Comment: (NOTE) This assay is not validated for testing neonatal or myeloproliferative syndrome specimens for Vitamin B12 levels. Performed at [facility], [address], [place], [place] [address]   TSH     Status: None   Collection Time: 06/30/23  2:29 AM  Result Value Ref Range   TSH 1.036 0.350 - 4.500 uIU/mL    Comment: Performed by a 3rd Generation assay with a functional sensitivity of <=0.01 uIU/mL. Performed at  [facility], [address] [place], [place] [address]   RPR     Status: None   Collection Time: 06/30/23  2:30 AM  Result Value Ref Range   RPR Ser Ql NON REACTIVE NON REACTIVE    Comment: Performed at [facility], [address] [place], [place] [address]  HIV Antibody (routine testing w rflx)     Status: None   Collection Time: 06/30/23  2:30 AM  Result Value Ref Range   HIV Screen 4th Generation wRfx Non Reactive Non Reactive    Comment: Performed at [facility], [address] [place], [place] [address]  Basic metabolic panel     Status: Abnormal   Collection Time: 06/30/23  4:22 AM  Result Value Ref Range   Sodium 137 135 - 145 mmol/L   Potassium 3.4 (L) 3.5 - 5.1 mmol/L   Chloride 104 98 - 111 mmol/L   CO2 23 22 - 32 mmol/L   Glucose, Bld 106 (H) 70 - 99 mg/dL    Comment: Glucose reference range applies only to samples taken after fasting for at least 8 hours.   BUN 16 8 - 23 mg/dL   Creatinine, Ser 9.37 0.61 - 1.24 mg/dL   Calcium 8.6 (L) 8.9 - 10.3 mg/dL   GFR, Estimated >39 >39 mL/min    Comment: (NOTE) Calculated using the CKD-EPI Creatinine Equation (2021)    Anion gap 10 5 - 15    Comment: Performed at [facility], [address] [place], [place] [address]  SARS Coronavirus 2 by RT PCR (hospital order, performed in [facility] hospital lab) *cepheid single result test* Anterior Nasal Swab     Status: None  Collection Time: 06/30/23  9:06 AM   Specimen: Anterior Nasal Swab  Result Value Ref Range   SARS Coronavirus 2 by RT PCR NEGATIVE NEGATIVE    Comment: (NOTE) SARS-CoV-2 target nucleic acids are NOT DETECTED.  The SARS-CoV-2 RNA is generally detectable in upper and lower respiratory specimens during the acute phase of infection. The lowest concentration of SARS-CoV-2 viral copies this assay can detect is 250 copies / mL. A negative result does not preclude SARS-CoV-2 infection and should not be used as the sole basis  for treatment or other patient management decisions.  A negative result may occur with improper specimen collection / handling, submission of specimen other than nasopharyngeal swab, presence of viral mutation(s) within the areas targeted by this assay, and inadequate number of viral copies (<250 copies / mL). A negative result must be combined with clinical observations, patient history, and epidemiological information.  Fact Sheet for Patients:   roadlaptop.co.za  Fact Sheet for Healthcare Providers: http://kim-miller.com/  This test is not yet approved or  cleared by the [place]  FDA and has been authorized for detection and/or diagnosis of SARS-CoV-2 by FDA under an Emergency Use Authorization (EUA).  This EUA will remain in effect (meaning this test can be used) for the duration of the COVID-19 declaration under Section 564(b)(1) of the Act, 21 U.S.C. section 360bbb-3(b)(1), unless the authorization is terminated or revoked sooner.  Performed at [facility], [address] [place], [place] [address]     Current Facility-Administered Medications  Medication Dose Route Frequency Provider Last Rate Last Admin   acetaminophen  (TYLENOL ) tablet 650 mg  650 mg Oral Q6H PRN Niu, Xilin, MD   325 mg at 06/29/23 2127   carbidopa -levodopa  (SINEMET  IR) 25-100 MG per tablet immediate release 2 tablet  2 tablet Oral TID Goodman, Graydon, MD   2 tablet at 07/01/23 1058   cyanocobalamin  (VITAMIN B12) tablet 1,000 mcg  1,000 mcg Oral Daily Niu, Xilin, MD   1,000 mcg at 07/01/23 1058   enoxaparin  (LOVENOX ) injection 40 mg  40 mg Subcutaneous Q24H Niu, Xilin, MD   40 mg at 06/30/23 2119   escitalopram  (LEXAPRO ) tablet 5 mg  5 mg Oral Daily Niu, Xilin, MD   5 mg at 07/01/23 1058   gabapentin  (NEURONTIN ) capsule 100 mg  100 mg Oral BID Niu, Xilin, MD   100 mg at 07/01/23 1059   hydrALAZINE  (APRESOLINE ) injection 5 mg  5 mg Intravenous Q2H  PRN Niu, Xilin, MD       latanoprost  (XALATAN ) 0.005 % ophthalmic solution 1 drop  1 drop Both Eyes QHS Niu, Xilin, MD   1 drop at 06/30/23 2231   ondansetron  (ZOFRAN ) injection 4 mg  4 mg Intravenous Q8H PRN Niu, Xilin, MD       ziprasidone  (GEODON ) injection 10 mg  10 mg Intramuscular Q4H PRN Jhonny Sahara B, MD   10 mg at 07/01/23 0554    Musculoskeletal: Strength & Muscle Tone: No cogwheel rigidity Gait & Station: Laying in bed Patient leans: N/A            Psychiatric Specialty Exam:  Presentation  General Appearance: No data recorded Eye Contact:No data recorded Speech:No data recorded Speech Volume:No data recorded Handedness:No data recorded  Mood and Affect  Mood:No data recorded Affect:No data recorded  Thought Process  Thought Processes:No data recorded Descriptions of Associations:No data recorded Orientation:No data recorded Thought Content:No data recorded History of Schizophrenia/Schizoaffective disorder:No data recorded Duration of Psychotic Symptoms:No data recorded Hallucinations:No  data recorded Ideas of Reference:No data recorded Suicidal Thoughts:No data recorded Homicidal Thoughts:No data recorded  Sensorium  Memory:No data recorded Judgment:No data recorded Insight:No data recorded  Executive Functions  Concentration:No data recorded Attention Span:No data recorded Recall:No data recorded Fund of Knowledge:No data recorded Language:No data recorded  Psychomotor Activity  Psychomotor Activity:No data recorded  Assets  Assets:No data recorded  Sleep  Sleep:No data recorded  Physical Exam: Physical Exam Vitals and nursing note reviewed.  Constitutional:      Appearance: Normal appearance. He is normal weight.  Neurological:     General: No focal deficit present.     Mental Status: He is alert. He is disoriented and confused.  Psychiatric:     Comments: Unable to assess psychiatric   MENTAL STATUS EXAM: Patient is  alert and obviously agitated trying to take off his wrist restraints and is hard of hearing but  he will look at me when I tap on his shoulder.  Obviously confused and there seems to be some type of metabolic encephalopathy going on, but I do not believe that this is psychiatric in nature.  CT was negative and I would recommend neurology follow-up and possible EEG.  In the meantime medication recommendations are below. Review of Systems  Constitutional: Negative.   HENT: Negative.    Eyes: Negative.   Respiratory: Negative.    Cardiovascular: Negative.   Gastrointestinal: Negative.   Genitourinary: Negative.   Musculoskeletal: Negative.   Skin: Negative.   Neurological: Negative.   Endo/Heme/Allergies: Negative.   Psychiatric/Behavioral: Negative.     Blood pressure 138/65, pulse 89, temperature 97.7 F (36.5 C), temperature source Oral, resp. rate 18, SpO2 96%. There is no height or weight on file to calculate BMI.  Treatment Plan Summary: Daily contact with patient to assess and evaluate symptoms and progress in treatment, Medication management, and Plan schedule Seroquel  low-dose 25 mg 3 times a day to start with.  I would avoid Haldol .  Titrate up Seroquel  as needed.  Use Ativan  1 mg every 4 hours as needed IM or IV.  Trazodone  150 mg at night for sleep.  Neurology follow-up for EEG.  Disposition: Patient does not meet criteria for psychiatric inpatient admission.  Charlie Dallas Salines, DO 07/01/2023 12:50 PM

## 2023-07-01 NOTE — Plan of Care (Signed)

## 2023-07-01 NOTE — Progress Notes (Signed)
 Patient became more agitated today around 1:30pm, patient was trying to get OOB, removed his gown, pulled IV out. He was not redirectable. Patient was given IM Geodon  at 1352, 1:1 safety sitter initiated. Patient has been able to take medications in apple sauce. He is incontinent of bowel and bladder. No falls no injury. Patient has been without mittens since noon today. Sitter at bedside.

## 2023-07-02 DIAGNOSIS — G9341 Metabolic encephalopathy: Secondary | ICD-10-CM | POA: Diagnosis not present

## 2023-07-02 MED ORDER — POLYVINYL ALCOHOL 1.4 % OP SOLN: 2.0000 [drp] | OPHTHALMIC | Status: DC | PRN

## 2023-07-02 NOTE — Progress Notes (Signed)
 PROGRESS NOTE    Justin Bird  FMW:[id number] DOB: Jan 20, 1941 DOA: 06/29/2023 PCP: [name], MD    Brief Narrative:   83 y.o. male with medical history significant of hard of hearing, depression, Parkinson's disease, allergy, who presents with altered mental status.   Patient has AMS with agitation, is unable to provide any medical history.  I called her daughter by phone, who provided most of the medical history. Per her daughter, at her normal baseline, patient is oriented to person and place, usually is confused about the time.  In the past 4 days, patient has been confused.  Patient is agitated, yelling around the ED.  He moves all extremities, kicking around.  No active nausea, vomiting, diarrhea noted.  No fever.  No respiratory distress, active cough noted.  Does not seem to have chest pain or abdominal pain.  Patient possibly missed some dose of Sinemet .   Assessment & Plan:   Principal Problem:   Acute metabolic encephalopathy Active Problems:   Parkinson's disease (HCC)   Acute metabolic encephalopathy: Etiology is not clear.  CT head negative for acute intracranial abnormalities.  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  Potential differential diagnosis to include early stage of dementia, delirium, vitamin B12 deficiency and abnormal thyroid function.  B12 and TSH within normal limits.  Patient has possibly missed some dose of Sinemet , which may have contributed partially due to withdrawal.  Patient received IV Ativan , IV Haldol , IM Zyprexa  in ED.   Psychiatry engaged 11/4.  Added 3 times daily Seroquel , nightly trazodone  to regimen.  At this time patient not a candidate for inpatient psychiatric admission.  No clear metabolic cause identified.  Can consider spot EEG though clinical suspicion for underlying seizure disorder is low  Plan: Fall precautions Administer 3 times daily Sinemet  3 times daily Seroquel  Nightly trazodone  IM Geodon  for  agitation Follow psychiatry recommendations  Parkinson's disease (HCC) Administer Sinemet  when safely possible     DVT prophylaxis: SQ Lovenox  Code Status: Full Family Communication:Son [name] [phone] on 1/3, daughter Isaiah [phone] on 1/5 Disposition Plan: Status is: Inpatient Remains inpatient appropriate because: Acute metabolic encephalopathy   Level of care: Med-Surg  Consultants:  Psychiatry  Procedures:  None  Antimicrobials: None    Subjective: Seen and examined.  More calm this morning but still unable to provide history.  Was taking his medications per RN.  No longer admits.  Not agitated.  Objective: Vitals:   07/01/23 1838 07/01/23 2023 07/02/23 0448 07/02/23 0734  BP: (!) 169/100 (!) 164/76 108/64 129/74  Pulse:  92 90 89  Resp: (!) 24   20  Temp: 97.9 F (36.6 C) 97.7 F (36.5 C) 97.6 F (36.4 C) 98.4 F (36.9 C)  TempSrc:   Oral   SpO2: 96% 98% 93% 98%    Intake/Output Summary (Last 24 hours) at 07/02/2023 1245 Last data filed at 07/02/2023 0900 Gross per 24 hour  Intake 0 ml  Output --  Net 0 ml   There were no vitals filed for this visit.  Examination:  General exam: Lethargic.  Unable to provide history Respiratory system: Bibasilar crackles.  Normal work of breathing.  Room air Cardiovascular system: S1-S2, RRR, no murmurs, no pedal edema Gastrointestinal system: Soft,/ND, normal bowel sounds Central nervous system: Unable to assess orientation Extremities: Unable to assess power Skin: No rashes, lesions or ulcers Psychiatry: Unable to assess    Data Reviewed: I have personally reviewed following labs and imaging studies  CBC:  Recent Labs  Lab 06/29/23 1447  WBC 8.3  HGB 15.6  HCT 47.3  MCV 89.8  PLT 628*   Basic Metabolic Panel: Recent Labs  Lab 06/29/23 1447 06/30/23 0422  NA 134* 137  K 3.7 3.4*  CL 101 104  CO2 19* 23  GLUCOSE 104* 106*  BUN 17 16  CREATININE 0.58* 0.62  CALCIUM 9.0 8.6*    GFR: CrCl cannot be calculated (Unknown ideal weight.). Liver Function Tests: Recent Labs  Lab 06/29/23 1447  AST 21  ALT 10  ALKPHOS 47  BILITOT 1.1  PROT 7.5  ALBUMIN 4.5   No results for input(s): LIPASE, AMYLASE in the last 168 hours. No results for input(s): AMMONIA in the last 168 hours. Coagulation Profile: No results for input(s): INR, PROTIME in the last 168 hours. Cardiac Enzymes: No results for input(s): CKTOTAL, CKMB, CKMBINDEX, TROPONINI in the last 168 hours. BNP (last 3 results) No results for input(s): PROBNP in the last 8760 hours. HbA1C: No results for input(s): HGBA1C in the last 72 hours. CBG: Recent Labs  Lab 06/29/23 1858  GLUCAP 96   Lipid Profile: No results for input(s): CHOL, HDL, LDLCALC, TRIG, CHOLHDL, LDLDIRECT in the last 72 hours. Thyroid Function Tests: Recent Labs    06/30/23 0229  TSH 1.036   Anemia Panel: Recent Labs    06/30/23 0229  VITAMINB12 319   Sepsis Labs: Recent Labs  Lab 06/30/23 0422  PROCALCITON <0.10    Recent Results (from the past 240 hours)  SARS Coronavirus 2 by RT PCR (hospital order, performed in [facility] hospital lab) *cepheid single result test* Anterior Nasal Swab     Status: None   Collection Time: 06/30/23  9:06 AM   Specimen: Anterior Nasal Swab  Result Value Ref Range Status   SARS Coronavirus 2 by RT PCR NEGATIVE NEGATIVE Final    Comment: (NOTE) SARS-CoV-2 target nucleic acids are NOT DETECTED.  The SARS-CoV-2 RNA is generally detectable in upper and lower respiratory specimens during the acute phase of infection. The lowest concentration of SARS-CoV-2 viral copies this assay can detect is 250 copies / mL. A negative result does not preclude SARS-CoV-2 infection and should not be used as the sole basis for treatment or other patient management decisions.  A negative result may occur with improper specimen collection / handling, submission of specimen  other than nasopharyngeal swab, presence of viral mutation(s) within the areas targeted by this assay, and inadequate number of viral copies (<250 copies / mL). A negative result must be combined with clinical observations, patient history, and epidemiological information.  Fact Sheet for Patients:   roadlaptop.co.za  Fact Sheet for Healthcare Providers: http://kim-miller.com/  This test is not yet approved or  cleared by the [place]  FDA and has been authorized for detection and/or diagnosis of SARS-CoV-2 by FDA under an Emergency Use Authorization (EUA).  This EUA will remain in effect (meaning this test can be used) for the duration of the COVID-19 declaration under Section 564(b)(1) of the Act, 21 U.S.C. section 360bbb-3(b)(1), unless the authorization is terminated or revoked sooner.  Performed at [facility], [address], [place], [place] [address]          Radiology Studies: No results found.       Scheduled Meds:  carbidopa -levodopa   2 tablet Oral TID   cyanocobalamin   1,000 mcg Oral Daily   enoxaparin  (LOVENOX ) injection  40 mg Subcutaneous Q24H   escitalopram   5 mg Oral Daily  gabapentin   100 mg Oral BID   latanoprost   1 drop Both Eyes QHS   QUEtiapine   25 mg Oral TID   traZODone   150 mg Oral QHS   Continuous Infusions:   LOS: 2 days      Calvin KATHEE Robson, MD [facility]   If 7PM-7AM, please contact night-coverage  07/02/2023, 12:45 PM

## 2023-07-03 DIAGNOSIS — G9341 Metabolic encephalopathy: Secondary | ICD-10-CM | POA: Diagnosis not present

## 2023-07-03 LAB — BASIC METABOLIC PANEL
Anion gap: 16 — ABNORMAL HIGH (ref 5–15)
BUN: 22 mg/dL (ref 8–23)
CO2: 19 mmol/L — ABNORMAL LOW (ref 22–32)
Calcium: 9 mg/dL (ref 8.9–10.3)
Chloride: 106 mmol/L (ref 98–111)
Creatinine, Ser: 0.6 mg/dL — ABNORMAL LOW (ref 0.61–1.24)
GFR, Estimated: >60 mL/min (ref >60)
Glucose, Bld: 125 mg/dL — ABNORMAL HIGH (ref 70–99)
Potassium: 3.7 mmol/L (ref 3.5–5.1)
Sodium: 141 mmol/L (ref 135–145)

## 2023-07-03 LAB — CBC
HCT: 46.5 % (ref 39.0–52.0)
Hemoglobin: 15.4 g/dL (ref 13.0–17.0)
MCH: 29.6 pg (ref 26.0–34.0)
MCHC: 33.1 g/dL (ref 30.0–36.0)
MCV: 89.4 fL (ref 80.0–100.0)
Platelets: 287 10*3/uL (ref 150–400)
RBC: 5.2 MIL/uL (ref 4.22–5.81)
RDW: 12.6 % (ref 11.5–15.5)
WBC: 7.9 10*3/uL (ref 4.0–10.5)
nRBC: 0 % (ref 0.0–0.2)

## 2023-07-03 LAB — PHOSPHORUS: Phosphorus: 3 mg/dL (ref 2.5–4.6)

## 2023-07-03 LAB — MAGNESIUM: Magnesium: 2.2 mg/dL (ref 1.7–2.4)

## 2023-07-03 LAB — VITAMIN D 25 HYDROXY (VIT D DEFICIENCY, FRACTURES): Vit D, 25-Hydroxy: 35.18 ng/mL (ref 30–100)

## 2023-07-03 MED ORDER — QUETIAPINE FUMARATE 25 MG PO TABS
100.0000 mg | ORAL_TABLET | Freq: Three times a day (TID) | ORAL | Status: DC
  Administered 2023-07-03 – 2023-07-12 (×27): 100 mg via ORAL
  Filled 2023-07-03 (×29): qty 4

## 2023-07-03 NOTE — Plan of Care (Signed)
   Problem: Nutrition: Goal: Adequate nutrition will be maintained Outcome: Progressing

## 2023-07-03 NOTE — Progress Notes (Signed)
 PROGRESS NOTE    Justin Bird  FMW:[id number] DOB: 30-Apr-1941 DOA: 06/29/2023 PCP: [name], MD    Brief Narrative:   83 y.o. male with medical history significant of hard of hearing, depression, Parkinson's disease, allergy, who presents with altered mental status.   Patient has AMS with agitation, is unable to provide any medical history.  I called her daughter by phone, who provided most of the medical history. Per her daughter, at her normal baseline, patient is oriented to person and place, usually is confused about the time.  In the past 4 days, patient has been confused.  Patient is agitated, yelling around the ED.  He moves all extremities, kicking around.  No active nausea, vomiting, diarrhea noted.  No fever.  No respiratory distress, active cough noted.  Does not seem to have chest pain or abdominal pain.  Patient possibly missed some dose of Sinemet .   Assessment & Plan:   Principal Problem:   Acute metabolic encephalopathy Active Problems:   Parkinson's disease (HCC)   Acute metabolic encephalopathy: Etiology is not clear.  CT head negative for acute intracranial abnormalities.  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  Potential differential diagnosis to include early stage of dementia, delirium, vitamin B12 deficiency and abnormal thyroid function.  B12 and TSH within normal limits.  Patient has possibly missed some dose of Sinemet , which may have contributed partially due to withdrawal.  Patient received IV Ativan , IV Haldol , IM Zyprexa  in ED.   Psychiatry engaged 11/4.  Added 3 times daily Seroquel , nightly trazodone  to regimen.  At this time patient not a candidate for inpatient psychiatric admission.  No clear metabolic cause identified.  Can consider spot EEG though clinical suspicion for underlying seizure disorder is low  Plan: Fall precautions Administer 3 times daily Sinemet  1/6 increased Seroquel  100 mg p.o. 3 times daily Nightly  trazodone  IM Geodon  for agitation   Follow psychiatry recommendations  Parkinson's disease (HCC) Administer Sinemet  when safely possible     DVT prophylaxis: SQ Lovenox  Code Status: Full Family Communication:Son [name] [phone] on 1/3, daughter Isaiah [phone] on 1/5 Disposition Plan: Status is: Inpatient Remains inpatient appropriate because: Acute metabolic encephalopathy   Level of care: Med-Surg  Consultants:  Psychiatry  Procedures:  None  Antimicrobials: None    Subjective: No significant overnight events.  Patient remained very confused and yelling.  Patient was lying naked in the bed, stopped himself due to disorientation. RN was advised to keep close eye.  Medications adjusted as above.  Objective: Vitals:   07/02/23 0448 07/02/23 0734 07/02/23 1513 07/03/23 0849  BP: 108/64 129/74 (!) 158/80 (!) 155/114  Pulse: 90 89 (!) 101 (!) 105  Resp:  20 20 20   Temp: 97.6 F (36.4 C) 98.4 F (36.9 C) 98.2 F (36.8 C) 98.2 F (36.8 C)  TempSrc: Oral  Oral Oral  SpO2: 93% 98% 94% 94%   No intake or output data in the 24 hours ending 07/03/23 1522  There were no vitals filed for this visit.  Examination:  General exam: Lethargic.  Unable to provide history Respiratory system: Bibasilar crackles.  Normal work of breathing.  Room air Cardiovascular system: S1-S2, RRR, no murmurs, no pedal edema Gastrointestinal system: Soft,/ND, normal bowel sounds Central nervous system: Unable to assess orientation Extremities: Unable to assess power Skin: No rashes, lesions or ulcers Psychiatry: Unable to assess    Data Reviewed: I have personally reviewed following labs and imaging studies  CBC: Recent Labs  Lab 06/29/23 1447 07/03/23 0954  WBC 8.3 7.9  HGB 15.6 15.4  HCT 47.3 46.5  MCV 89.8 89.4  PLT 628* 287   Basic Metabolic Panel: Recent Labs  Lab 06/29/23 1447 06/30/23 0422 07/03/23 0954  NA 134* 137 141  K 3.7 3.4* 3.7  CL 101 104 106   CO2 19* 23 19*  GLUCOSE 104* 106* 125*  BUN 17 16 22   CREATININE 0.58* 0.62 0.60*  CALCIUM 9.0 8.6* 9.0  MG  --   --  2.2  PHOS  --   --  3.0   GFR: CrCl cannot be calculated (Unknown ideal weight.). Liver Function Tests: Recent Labs  Lab 06/29/23 1447  AST 21  ALT 10  ALKPHOS 47  BILITOT 1.1  PROT 7.5  ALBUMIN 4.5   No results for input(s): LIPASE, AMYLASE in the last 168 hours. No results for input(s): AMMONIA in the last 168 hours. Coagulation Profile: No results for input(s): INR, PROTIME in the last 168 hours. Cardiac Enzymes: No results for input(s): CKTOTAL, CKMB, CKMBINDEX, TROPONINI in the last 168 hours. BNP (last 3 results) No results for input(s): PROBNP in the last 8760 hours. HbA1C: No results for input(s): HGBA1C in the last 72 hours. CBG: Recent Labs  Lab 06/29/23 1858  GLUCAP 96   Lipid Profile: No results for input(s): CHOL, HDL, LDLCALC, TRIG, CHOLHDL, LDLDIRECT in the last 72 hours. Thyroid Function Tests: No results for input(s): TSH, T4TOTAL, FREET4, T3FREE, THYROIDAB in the last 72 hours.  Anemia Panel: No results for input(s): VITAMINB12, FOLATE, FERRITIN, TIBC, IRON, RETICCTPCT in the last 72 hours.  Sepsis Labs: Recent Labs  Lab 06/30/23 0422  PROCALCITON <0.10    Recent Results (from the past 240 hours)  SARS Coronavirus 2 by RT PCR (hospital order, performed in [facility] hospital lab) *cepheid single result test* Anterior Nasal Swab     Status: None   Collection Time: 06/30/23  9:06 AM   Specimen: Anterior Nasal Swab  Result Value Ref Range Status   SARS Coronavirus 2 by RT PCR NEGATIVE NEGATIVE Final    Comment: (NOTE) SARS-CoV-2 target nucleic acids are NOT DETECTED.  The SARS-CoV-2 RNA is generally detectable in upper and lower respiratory specimens during the acute phase of infection. The lowest concentration of SARS-CoV-2 viral copies this assay can detect is  250 copies / mL. A negative result does not preclude SARS-CoV-2 infection and should not be used as the sole basis for treatment or other patient management decisions.  A negative result may occur with improper specimen collection / handling, submission of specimen other than nasopharyngeal swab, presence of viral mutation(s) within the areas targeted by this assay, and inadequate number of viral copies (<250 copies / mL). A negative result must be combined with clinical observations, patient history, and epidemiological information.  Fact Sheet for Patients:   roadlaptop.co.za  Fact Sheet for Healthcare Providers: http://kim-miller.com/  This test is not yet approved or  cleared by the [place]  FDA and has been authorized for detection and/or diagnosis of SARS-CoV-2 by FDA under an Emergency Use Authorization (EUA).  This EUA will remain in effect (meaning this test can be used) for the duration of the COVID-19 declaration under Section 564(b)(1) of the Act, 21 U.S.C. section 360bbb-3(b)(1), unless the authorization is terminated or revoked sooner.  Performed at [facility], [address], [place], [place] [address]          Radiology Studies: No results found.  Scheduled Meds:  carbidopa -levodopa   2 tablet Oral TID   cyanocobalamin   1,000 mcg Oral Daily   enoxaparin  (LOVENOX ) injection  40 mg Subcutaneous Q24H   latanoprost   1 drop Both Eyes QHS   QUEtiapine   100 mg Oral TID   traZODone   150 mg Oral QHS   Continuous Infusions:   LOS: 3 days      Elvan Sor, MD [facility]   If 7PM-7AM, please contact night-coverage  07/03/2023, 3:22 PM

## 2023-07-03 NOTE — Progress Notes (Signed)
 Placed in pts chart for RN

## 2023-07-03 NOTE — Plan of Care (Signed)

## 2023-07-04 DIAGNOSIS — G9341 Metabolic encephalopathy: Secondary | ICD-10-CM | POA: Diagnosis not present

## 2023-07-04 MED ORDER — SODIUM BICARBONATE 8.4 % IV SOLN
50.0000 meq | Freq: Once | INTRAVENOUS | Status: AC
  Administered 2023-07-04: 50 meq via INTRAVENOUS
  Filled 2023-07-04: qty 50

## 2023-07-04 MED ORDER — POLYETHYLENE GLYCOL 3350 17 G PO PACK
17.0000 g | PACK | Freq: Two times a day (BID) | ORAL | Status: DC
  Administered 2023-07-04 – 2023-08-03 (×48): 17 g via ORAL
  Filled 2023-07-04 (×48): qty 1

## 2023-07-04 MED ORDER — SODIUM CHLORIDE 0.9 % IV SOLN: INTRAVENOUS | Status: DC

## 2023-07-04 MED ORDER — BISACODYL 10 MG RE SUPP
10.0000 mg | Freq: Every day | RECTAL | Status: DC | PRN
  Administered 2023-07-13: 10 mg via RECTAL
  Filled 2023-07-04: qty 1

## 2023-07-04 MED ORDER — BISACODYL 5 MG PO TBEC
10.0000 mg | DELAYED_RELEASE_TABLET | Freq: Every day | ORAL | Status: DC
  Administered 2023-07-04: 10 mg via ORAL
  Filled 2023-07-04: qty 2

## 2023-07-04 MED ORDER — ENSURE ENLIVE PO LIQD
237.0000 mL | Freq: Two times a day (BID) | ORAL | Status: DC
  Administered 2023-07-05 – 2023-08-03 (×37): 237 mL via ORAL

## 2023-07-04 NOTE — Plan of Care (Addendum)
 Patient is alert and oriented X 1. He has been agitated, impulsive and trying to get out from bed frequently. He is not able to follow commands. He does not have any bowel movement since admission, gave laxatives as order. MD made aware. He has poor oral intake, IVF is going on as order. Tele sitter at bed site. Mittens on his  both hands to save I/v.     Problem: Safety: Goal: Non-violent Restraint(s) Outcome: Progressing   Problem: Education: Goal: Knowledge of General Education information will improve Description: Including pain rating scale, medication(s)/side effects and non-pharmacologic comfort measures Outcome: Progressing   Problem: Health Behavior/Discharge Planning: Goal: Ability to manage health-related needs will improve Outcome: Progressing   Problem: Clinical Measurements: Goal: Ability to maintain clinical measurements within normal limits will improve Outcome: Progressing Goal: Will remain free from infection Outcome: Progressing Goal: Diagnostic test results will improve Outcome: Progressing Goal: Respiratory complications will improve Outcome: Progressing Goal: Cardiovascular complication will be avoided Outcome: Progressing   Problem: Activity: Goal: Risk for activity intolerance will decrease Outcome: Progressing   Problem: Nutrition: Goal: Adequate nutrition will be maintained Outcome: Progressing   Problem: Coping: Goal: Level of anxiety will decrease Outcome: Progressing   Problem: Elimination: Goal: Will not experience complications related to bowel motility Outcome: Progressing Goal: Will not experience complications related to urinary retention Outcome: Progressing   Problem: Pain Management: Goal: General experience of comfort will improve Outcome: Progressing   Problem: Safety: Goal: Ability to remain free from injury will improve Outcome: Progressing   Problem: Skin Integrity: Goal: Risk for impaired skin integrity will  decrease Outcome: Progressing

## 2023-07-04 NOTE — Plan of Care (Signed)
  Problem: Safety: Goal: Non-violent Restraint(s) Outcome: Progressing   Problem: Education: Goal: Knowledge of General Education information will improve Description: Including pain rating scale, medication(s)/side effects and non-pharmacologic comfort measures Outcome: Progressing   Problem: Health Behavior/Discharge Planning: Goal: Ability to manage health-related needs will improve Outcome: Progressing   Problem: Health Behavior/Discharge Planning: Goal: Ability to manage health-related needs will improve Outcome: Progressing   Problem: Clinical Measurements: Goal: Ability to maintain clinical measurements within normal limits will improve Outcome: Progressing Goal: Will remain free from infection Outcome: Progressing Goal: Diagnostic test results will improve Outcome: Progressing Goal: Respiratory complications will improve Outcome: Progressing Goal: Cardiovascular complication will be avoided Outcome: Progressing   Problem: Activity: Goal: Risk for activity intolerance will decrease Outcome: Progressing   Problem: Nutrition: Goal: Adequate nutrition will be maintained Outcome: Progressing   Problem: Coping: Goal: Level of anxiety will decrease Outcome: Progressing

## 2023-07-04 NOTE — Progress Notes (Signed)
 Occupational Therapy Treatment Patient Details Name: Justin Bird MRN: [id number] DOB: Jul 20, 1940 Today's Date: 07/04/2023   History of present illness Pt is an 83 y/o M admitted on 06/29/23 after presenting with AMS. Head CT negative for acute issues (unable to obtain MRI 2/2 cochlear implant). PMH: HOH with cochlear implant, depression, Parkinson's disease   OT comments  Pt received semi-reclined in bed. Appearing alert; asking about [place] ; willing to work with OT on sitting EOB, grooming, and walking. T/f MOD A to EOB; +2 for all mobility 2/2 balance deficits. Pt also appears to have visual deficits and is significantly hard of hearing. See flowsheet below for further details of session. Left semi-reclined in bed with all needs in reach.  Patient will benefit from continued OT while in acute care.       If plan is discharge home, recommend the following:  Two people to help with walking and/or transfers;Direct supervision/assist for financial management;Two people to help with bathing/dressing/bathroom;Direct supervision/assist for medications management;Supervision due to cognitive status;Assistance with cooking/housework;Help with stairs or ramp for entrance;Assist for transportation   Equipment Recommendations  Other (comment) (defer to next venue of care)    Recommendations for Other Services      Precautions / Restrictions Precautions Precautions: Fall Precaution Comments: pt does not appear to be able to hear or see well. Restrictions Weight Bearing Restrictions Per Provider Order: No       Mobility Bed Mobility Overal bed mobility: Needs Assistance Bed Mobility: Supine to Sit, Sit to Supine     Supine to sit: Mod assist Sit to supine: Mod assist   General bed mobility comments: difficulty understanding instructions and seeing where OT was instructing him to put hands for scooting forward to the edge of the bed.    Transfers Overall transfer level: Needs  assistance Equipment used: Rolling walker (2 wheels), 2 person hand held assist Transfers: Sit to/from Stand Sit to Stand: Mod assist, +2 physical assistance           General transfer comment: Did HHA and RW transfers during session. Pt verbalizing desire to go home and motivated to get out of bed. Strong posterior lean; did better with RW; intermittent +2 (sometimes just +1), but very unreliable, so not safe to remove second person from mobility at this time. Able to walk approx 5 feet forward, then turned and sidestepped back towards the [facility]; mostly tactile cues due to vision and hearing impairment.     Balance Overall balance assessment: Needs assistance Sitting-balance support: Bilateral upper extremity supported, Feet unsupported Sitting balance-Leahy Scale: Fair     Standing balance support: Bilateral upper extremity supported, Reliant on assistive device for balance Standing balance-Leahy Scale: Poor                             ADL either performed or assessed with clinical judgement   ADL Overall ADL's : Needs assistance/impaired     Grooming: Wash/dry face;Oral care;Brushing hair;Maximal assistance;Sitting Grooming Details (indicate cue type and reason): OT provided tools for pt to complete grooming; however 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 OT provided hand-under-hand assist for brushing teeth and attempted to fade away to see if pt would continue the task, but he did not. Pt did engage with bottle of lotion and able to unscrew the top and understood to squeeze some on OT's hand and then onto  his hand; was able to rub lotion successfully onto his hands and forearms.             Lower Body Dressing: Total assistance;Bed level Lower Body Dressing Details (indicate cue type and reason): Attempted to have pt don BIL socks; he did not appear to know what the socks were for;  OT total assist.             Functional mobility during ADLs:  (Pt seated for ADLs today) General ADL Comments: Very limted by confusion, hearing deficit, and apparent visual deficit or apraxia.    Extremity/Trunk Assessment Upper Extremity Assessment Upper Extremity Assessment: Overall WFL for tasks assessed;Difficult to assess due to impaired cognition   Lower Extremity Assessment Lower Extremity Assessment: Generalized weakness;Defer to PT evaluation        Vision   Vision Assessment?: Vision impaired- to be further tested in functional context Additional Comments: Unable to fully assess due to AMS and hearing deficit. However, pt appearing not to see objects (such as lotion bottle with bright pink cap) placed in front of him.   Perception     Praxis      Cognition Arousal: Alert Behavior During Therapy: Impulsive Overall Cognitive Status: No family/caregiver present to determine baseline cognitive functioning                                 General Comments: Pt very alert, asking about where his glasses and wallet are, asking about [place] ; very confused during session, but not agitated, just more concerned. Very kind to therapists, thanking PT and OT for helping him. Very HOH, so difficult communication and also appears to have visual impairment. Does look towards OT when OT speaks, but doesn't understand speech, is able to state that he doesn't hear, and does not appear to see objects placed in his field of vision. Intermittently able to identify a task, but sometimes not (such as toothbrushing).        Exercises      Shoulder Instructions       General Comments On room air.    Pertinent Vitals/ Pain       Pain Assessment Pain Assessment: PAINAD Breathing: normal Negative Vocalization: none Facial Expression: smiling or inexpressive Body Language: relaxed Consolability: no need to console PAINAD Score: 0  Home Living                                           Prior Functioning/Environment              Frequency  Min 1X/week        Progress Toward Goals  OT Goals(current goals can now be found in the care plan section)  Progress towards OT goals: Progressing toward goals  Acute Rehab OT Goals Patient Stated Goal: Go home OT Goal Formulation: Patient unable to participate in goal setting Time For Goal Achievement: 07/14/23 ADL Goals Pt Will Perform Eating: sitting;with supervision;with set-up Pt Will Perform Grooming: sitting;with set-up;with supervision Pt Will Perform Upper Body Dressing: sitting;with min assist Pt Will Perform Lower Body Dressing: sit to/from stand;with min assist;with adaptive equipment  Plan      Co-evaluation    PT/OT/SLP Co-Evaluation/Treatment: Yes Reason for Co-Treatment: For patient/therapist safety;Necessary to address cognition/behavior during functional activity;Complexity of the patient's impairments (multi-system involvement)  OT goals addressed during session: ADL's and self-care      AM-PAC OT 6 Clicks Daily Activity     Outcome Measure   Help from another person eating meals?: A Little Help from another person taking care of personal grooming?: A Lot Help from another person toileting, which includes using toliet, bedpan, or urinal?: Total Help from another person bathing (including washing, rinsing, drying)?: Total Help from another person to put on and taking off regular upper body clothing?: A Lot Help from another person to put on and taking off regular lower body clothing?: Total 6 Click Score: 10    End of Session Equipment Utilized During Treatment: Rolling walker (2 wheels)  OT Visit Diagnosis: Other abnormalities of gait and mobility (R26.89);Other symptoms and signs involving cognitive function   Activity Tolerance Patient tolerated treatment well   Patient Left in bed;with call bell/phone within reach;with bed alarm set   Nurse  Communication Mobility status        Time: 9040-8972 OT Time Calculation (min): 28 min  Charges: OT General Charges $OT Visit: 1 Visit OT Treatments $Self Care/Home Management : 8-22 mins  Jasmine Arlean Shams, MS, OTR/L   Jasmine Shams 07/04/2023, 11:02 AM

## 2023-07-04 NOTE — Progress Notes (Signed)
 Physical Therapy Treatment Patient Details Name: Justin Bird MRN: [id number] DOB: 04/11/1941 Today's Date: 07/04/2023   History of Present Illness Pt is an 83 y/o M admitted on 06/29/23 after presenting with AMS. Head CT negative for acute issues (unable to obtain MRI 2/2 cochlear implant). PMH: HOH with cochlear implant, depression, Parkinson's disease    PT Comments  Patient supine in bed on arrival. Seen in conjunction with OT to maximize patient's tolerance and addressing cognition. Patient is significantly HOH and possible visual deficits noted. Confused throughout session but easily redirected. Able to perform sit to stand transfer with modA+2 and ambulate ~5' with RW and min-modA+2. Impulsive throughout and requires +2 for mobility for safety. Discharge plan remains appropriate.     If plan is discharge home, recommend the following: Two people to help with walking and/or transfers;Two people to help with bathing/dressing/bathroom;Direct supervision/assist for medications management;Help with stairs or ramp for entrance;Assist for transportation;Assistance with feeding;Direct supervision/assist for financial management;Assistance with cooking/housework;Supervision due to cognitive status   Can travel by private vehicle     No  Equipment Recommendations  Other (comment) (TBD)    Recommendations for Other Services       Precautions / Restrictions Precautions Precautions: Fall Precaution Comments: pt does not appear to be able to hear or see well. Restrictions Weight Bearing Restrictions Per Provider Order: No     Mobility  Bed Mobility Overal bed mobility: Needs Assistance Bed Mobility: Supine to Sit, Sit to Supine     Supine to sit: Mod assist Sit to supine: Mod assist   General bed mobility comments: difficulty understanding instructions and seeing where OT was instructing him to put hands for scooting forward to the edge of the bed.    Transfers Overall transfer  level: Needs assistance Equipment used: Rolling [name] (2 wheels), 2 person hand held assist Transfers: Sit to/from Stand Sit to Stand: Mod assist, +2 physical assistance           General transfer comment: Did HHA and RW transfers during session. Pt verbalizing desire to go home and motivated to get out of bed. Strong posterior lean; did better with RW; intermittent +2 (sometimes just +1), but very unreliable, so not safe to remove second person from mobility at this time. Able to walk approx 5 feet forward, then turned and sidestepped back towards the [facility]; mostly tactile cues due to vision and hearing impairment.    Ambulation/Gait                   Stairs             Wheelchair Mobility     Tilt Bed    Modified Rankin (Stroke Patients Only)       Balance Overall balance assessment: Needs assistance Sitting-balance support: Bilateral upper extremity supported, Feet unsupported Sitting balance-Leahy Scale: Fair     Standing balance support: Bilateral upper extremity supported, Reliant on assistive device for balance Standing balance-Leahy Scale: Poor                              Cognition Arousal: Alert Behavior During Therapy: Impulsive Overall Cognitive Status: No family/caregiver present to determine baseline cognitive functioning                                 General Comments: Pt very alert, asking about where his glasses and wallet are,  asking about [place] ; very confused during session, but not agitated, just more concerned. Very kind to therapists, thanking PT and OT for helping him. Very HOH, so difficult communication and also appears to have visual impairment. Does look towards OT when OT speaks, but doesn't understand speech, is able to state that he doesn't hear, and does not appear to see objects placed in his field of vision. Intermittently able to identify a task, but sometimes not (such as toothbrushing).         Exercises      General Comments General comments (skin integrity, edema, etc.): On room air.      Pertinent Vitals/Pain Pain Assessment Pain Assessment: PAINAD Breathing: normal Negative Vocalization: none Facial Expression: smiling or inexpressive Body Language: relaxed Consolability: no need to console PAINAD Score: 0    Home Living                          Prior Function            PT Goals (current goals can now be found in the care plan section) Acute Rehab PT Goals PT Goal Formulation: Patient unable to participate in goal setting Time For Goal Achievement: 07/14/23 Potential to Achieve Goals: Fair Progress towards PT goals: Progressing toward goals    Frequency    Min 1X/week      PT Plan      Co-evaluation PT/OT/SLP Co-Evaluation/Treatment: Yes Reason for Co-Treatment: For patient/therapist safety;Necessary to address cognition/behavior during functional activity;Complexity of the patient's impairments (multi-system involvement) PT goals addressed during session: Mobility/safety with mobility;Balance OT goals addressed during session: ADL's and self-care      AM-PAC PT 6 Clicks Mobility   Outcome Measure  Help needed turning from your back to your side while in a flat bed without using bedrails?: Total Help needed moving from lying on your back to sitting on the side of a flat bed without using bedrails?: Total Help needed moving to and from a bed to a chair (including a wheelchair)?: Total Help needed standing up from a chair using your arms (e.g., wheelchair or bedside chair)?: Total Help needed to walk in hospital room?: Total Help needed climbing 3-5 steps with a railing? : Total 6 Click Score: 6    End of Session   Activity Tolerance: Other (comment) (Limited by cognition) Patient left: in bed;with call bell/phone within reach;with bed alarm set Nurse Communication: Mobility status PT Visit Diagnosis: Muscle weakness  (generalized) (M62.81);Other abnormalities of gait and mobility (R26.89);Difficulty in walking, not elsewhere classified (R26.2)     Time: 1000-1027 PT Time Calculation (min) (ACUTE ONLY): 27 min  Charges:    $Therapeutic Activity: 8-22 mins PT General Charges $$ ACUTE PT VISIT: 1 Visit                     Maryanne Finder, PT, DPT Physical Therapist - [facility]  [facility]    Normalee Sistare A Bich Mchaney 07/04/2023, 11:08 AM

## 2023-07-04 NOTE — Progress Notes (Signed)
 PROGRESS NOTE    Justin Bird  FMW:[id number] DOB: 1941/04/12 DOA: 06/29/2023 PCP: [name], MD    Brief Narrative:   83 y.o. male with medical history significant of hard of hearing, depression, Parkinson's disease, allergy, who presents with altered mental status.   Patient has AMS with agitation, is unable to provide any medical history.  I called her daughter by phone, who provided most of the medical history. Per her daughter, at her normal baseline, patient is oriented to person and place, usually is confused about the time.  In the past 4 days, patient has been confused.  Patient is agitated, yelling around the ED.  He moves all extremities, kicking around.  No active nausea, vomiting, diarrhea noted.  No fever.  No respiratory distress, active cough noted.  Does not seem to have chest pain or abdominal pain.  Patient possibly missed some dose of Sinemet .   Assessment & Plan:   Principal Problem:   Acute metabolic encephalopathy Active Problems:   Parkinson's disease (HCC)   Acute metabolic encephalopathy: Etiology is not clear.  CT head negative for acute intracranial abnormalities.  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  Potential differential diagnosis to include early stage of dementia, delirium, vitamin B12 deficiency and abnormal thyroid function.  B12 and TSH within normal limits.  Patient has possibly missed some dose of Sinemet , which may have contributed partially due to withdrawal.  Patient received IV Ativan , IV Haldol , IM Zyprexa  in ED.   Psychiatry engaged 11/4.  Added 3 times daily Seroquel , nightly trazodone  to regimen.  At this time patient not a candidate for inpatient psychiatric admission.  No clear metabolic cause identified.  Can consider spot EEG though clinical suspicion for underlying seizure disorder is low  Plan: Fall precautions Administer 3 times daily Sinemet  1/6 increased Seroquel  100 mg p.o. 3 times daily Nightly  trazodone  IM Geodon  for agitation   Follow psychiatry recommendations 1/7 reconsulted psych again.  Parkinson's disease (HCC) Continue Sinemet  3 times daily home dose      DVT prophylaxis: SQ Lovenox  Code Status: Full Family Communication:Son [name] [phone] on 1/7, daughter Isaiah [phone] on 1/5 Disposition Plan: Status is: Inpatient Remains inpatient appropriate because: Acute metabolic encephalopathy   Level of care: Med-Surg  Consultants:  Psychiatry  Procedures:  None  Antimicrobials: None    Subjective: No significant overnight events.  Patient still very confused, altered mental status, unable to offer any complaints, talking randomly.  Objective: Vitals:   07/03/23 0849 07/03/23 1937 07/04/23 0530 07/04/23 0803  BP: (!) 155/114 (!) 140/68 (!) 134/114 (!) 163/76  Pulse: (!) 105 96 94 92  Resp: 20 18 18    Temp: 98.2 F (36.8 C) 98.1 F (36.7 C) 97.6 F (36.4 C) 97.9 F (36.6 C)  TempSrc: Oral     SpO2: 94% 95% (!) 88% 100%    Intake/Output Summary (Last 24 hours) at 07/04/2023 1443 Last data filed at 07/04/2023 1147 Gross per 24 hour  Intake 120 ml  Output 650 ml  Net -530 ml    There were no vitals filed for this visit.  Examination:  General exam: NAD, confused and agitated, unable to offer any complaints Respiratory system: Bibasilar crackles.  Normal work of breathing.  Room air Cardiovascular system: S1-S2, RRR, no murmurs, no pedal edema Gastrointestinal system: Soft,/ND, normal bowel sounds Central nervous system: Agitated, confused, moving all extremities spontaneously, no focal deficit Extremities: Unable to assess power Skin: No rashes, lesions or ulcers Psychiatry: Confused  and agitated  Data Reviewed: I have personally reviewed following labs and imaging studies  CBC: Recent Labs  Lab 06/29/23 1447 07/03/23 0954  WBC 8.3 7.9  HGB 15.6 15.4  HCT 47.3 46.5  MCV 89.8 89.4  PLT 628* 287   Basic Metabolic  Panel: Recent Labs  Lab 06/29/23 1447 06/30/23 0422 07/03/23 0954  NA 134* 137 141  K 3.7 3.4* 3.7  CL 101 104 106  CO2 19* 23 19*  GLUCOSE 104* 106* 125*  BUN 17 16 22   CREATININE 0.58* 0.62 0.60*  CALCIUM 9.0 8.6* 9.0  MG  --   --  2.2  PHOS  --   --  3.0   GFR: CrCl cannot be calculated (Unknown ideal weight.). Liver Function Tests: Recent Labs  Lab 06/29/23 1447  AST 21  ALT 10  ALKPHOS 47  BILITOT 1.1  PROT 7.5  ALBUMIN 4.5   No results for input(s): LIPASE, AMYLASE in the last 168 hours. No results for input(s): AMMONIA in the last 168 hours. Coagulation Profile: No results for input(s): INR, PROTIME in the last 168 hours. Cardiac Enzymes: No results for input(s): CKTOTAL, CKMB, CKMBINDEX, TROPONINI in the last 168 hours. BNP (last 3 results) No results for input(s): PROBNP in the last 8760 hours. HbA1C: No results for input(s): HGBA1C in the last 72 hours. CBG: Recent Labs  Lab 06/29/23 1858  GLUCAP 96   Lipid Profile: No results for input(s): CHOL, HDL, LDLCALC, TRIG, CHOLHDL, LDLDIRECT in the last 72 hours. Thyroid Function Tests: No results for input(s): TSH, T4TOTAL, FREET4, T3FREE, THYROIDAB in the last 72 hours.  Anemia Panel: No results for input(s): VITAMINB12, FOLATE, FERRITIN, TIBC, IRON, RETICCTPCT in the last 72 hours.  Sepsis Labs: Recent Labs  Lab 06/30/23 0422  PROCALCITON <0.10    Recent Results (from the past 240 hours)  SARS Coronavirus 2 by RT PCR (hospital order, performed in [facility] hospital lab) *cepheid single result test* Anterior Nasal Swab     Status: None   Collection Time: 06/30/23  9:06 AM   Specimen: Anterior Nasal Swab  Result Value Ref Range Status   SARS Coronavirus 2 by RT PCR NEGATIVE NEGATIVE Final    Comment: (NOTE) SARS-CoV-2 target nucleic acids are NOT DETECTED.  The SARS-CoV-2 RNA is generally detectable in upper and lower respiratory  specimens during the acute phase of infection. The lowest concentration of SARS-CoV-2 viral copies this assay can detect is 250 copies / mL. A negative result does not preclude SARS-CoV-2 infection and should not be used as the sole basis for treatment or other patient management decisions.  A negative result may occur with improper specimen collection / handling, submission of specimen other than nasopharyngeal swab, presence of viral mutation(s) within the areas targeted by this assay, and inadequate number of viral copies (<250 copies / mL). A negative result must be combined with clinical observations, patient history, and epidemiological information.  Fact Sheet for Patients:   roadlaptop.co.za  Fact Sheet for Healthcare Providers: http://kim-miller.com/  This test is not yet approved or  cleared by the [place]  FDA and has been authorized for detection and/or diagnosis of SARS-CoV-2 by FDA under an Emergency Use Authorization (EUA).  This EUA will remain in effect (meaning this test can be used) for the duration of the COVID-19 declaration under Section 564(b)(1) of the Act, 21 U.S.C. section 360bbb-3(b)(1), unless the authorization is terminated or revoked sooner.  Performed at [facility], [address], [place], [place]  [address]     Radiology Studies: No results found.   Scheduled Meds:  bisacodyl   10 mg Oral QHS   carbidopa -levodopa   2 tablet Oral TID   cyanocobalamin   1,000 mcg Oral Daily   enoxaparin  (LOVENOX ) injection  40 mg Subcutaneous Q24H   latanoprost   1 drop Both Eyes QHS   polyethylene glycol  17 g Oral BID   QUEtiapine   100 mg Oral TID   traZODone   150 mg Oral QHS   Continuous Infusions:  sodium chloride  75 mL/hr at 07/04/23 1251     LOS: 4 days    Time spent: 35 minutes  Elvan Sor, MD [facility]   If 7PM-7AM, please contact night-coverage  07/04/2023, 2:43 PM

## 2023-07-05 DIAGNOSIS — G9341 Metabolic encephalopathy: Secondary | ICD-10-CM | POA: Diagnosis not present

## 2023-07-05 LAB — MAGNESIUM: Magnesium: 2.2 mg/dL (ref 1.7–2.4)

## 2023-07-05 LAB — CBC
HCT: 45.3 % (ref 39.0–52.0)
Hemoglobin: 15.1 g/dL (ref 13.0–17.0)
MCH: 30 pg (ref 26.0–34.0)
MCHC: 33.3 g/dL (ref 30.0–36.0)
MCV: 90.1 fL (ref 80.0–100.0)
Platelets: 295 10*3/uL (ref 150–400)
RBC: 5.03 MIL/uL (ref 4.22–5.81)
RDW: 12.7 % (ref 11.5–15.5)
WBC: 7.5 10*3/uL (ref 4.0–10.5)
nRBC: 0 % (ref 0.0–0.2)

## 2023-07-05 LAB — BASIC METABOLIC PANEL
Anion gap: 12 (ref 5–15)
BUN: 15 mg/dL (ref 8–23)
CO2: 26 mmol/L (ref 22–32)
Calcium: 8.9 mg/dL (ref 8.9–10.3)
Chloride: 107 mmol/L (ref 98–111)
Creatinine, Ser: 0.6 mg/dL — ABNORMAL LOW (ref 0.61–1.24)
GFR, Estimated: >60 mL/min (ref >60)
Glucose, Bld: 97 mg/dL (ref 70–99)
Potassium: 3.4 mmol/L — ABNORMAL LOW (ref 3.5–5.1)
Sodium: 145 mmol/L (ref 135–145)

## 2023-07-05 LAB — PHOSPHORUS: Phosphorus: 3.4 mg/dL (ref 2.5–4.6)

## 2023-07-05 MED ORDER — ESCITALOPRAM OXALATE 10 MG PO TABS
5.0000 mg | ORAL_TABLET | Freq: Every day | ORAL | Status: DC
Start: 2023-07-05 — End: 2023-08-03
  Administered 2023-07-05 – 2023-08-03 (×30): 5 mg via ORAL
  Filled 2023-07-05 (×30): qty 1

## 2023-07-05 MED ORDER — CHLORHEXIDINE GLUCONATE CLOTH 2 % EX PADS
6.0000 | MEDICATED_PAD | Freq: Every day | CUTANEOUS | Status: DC
  Administered 2023-07-06 – 2023-07-13 (×7): 6 via TOPICAL

## 2023-07-05 MED ORDER — SODIUM CHLORIDE 0.9 % IV SOLN: INTRAVENOUS | Status: DC

## 2023-07-05 MED ORDER — BISACODYL 5 MG PO TBEC
10.0000 mg | DELAYED_RELEASE_TABLET | Freq: Two times a day (BID) | ORAL | Status: AC
  Administered 2023-07-05 – 2023-07-06 (×4): 10 mg via ORAL
  Filled 2023-07-05 (×4): qty 2

## 2023-07-05 MED ORDER — DOXAZOSIN MESYLATE 1 MG PO TABS
1.0000 mg | ORAL_TABLET | Freq: Every day | ORAL | Status: DC
  Administered 2023-07-05 – 2023-08-03 (×29): 1 mg via ORAL
  Filled 2023-07-05 (×30): qty 1

## 2023-07-05 MED ORDER — BISACODYL 5 MG PO TBEC
10.0000 mg | DELAYED_RELEASE_TABLET | Freq: Every day | ORAL | Status: DC
  Administered 2023-07-07 – 2023-08-02 (×22): 10 mg via ORAL
  Filled 2023-07-05 (×23): qty 2

## 2023-07-05 MED ORDER — BISACODYL 10 MG RE SUPP
10.0000 mg | Freq: Once | RECTAL | Status: AC
  Administered 2023-07-05: 10 mg via RECTAL
  Filled 2023-07-05: qty 1

## 2023-07-05 NOTE — TOC Progression Note (Signed)
 Transition of Care [facility]) - Progression Note    Patient Details  Name: Justin Bird MRN: [id number] Date of Birth: 10/09/1940  [facility] CM/SW Contact  Ladene Lady, LCSW Phone Number: 07/05/2023, 3:25 PM  Clinical Narrative:   CSW spoke with son who has accepted bed offer for [facility].    Expected Discharge Plan: Skilled Nursing Facility Barriers to Discharge: Continued Medical Work up  Expected Discharge Plan and Services       Living arrangements for the past 2 months: Single Family Home                                       Social Determinants of Health (SDOH) Interventions SDOH Screenings   Food Insecurity: Patient Unable To Answer (07/01/2023)  Housing: Patient Unable To Answer (07/01/2023)  Transportation Needs: No Transportation Needs (07/01/2023)  Utilities: Not At Risk (07/01/2023)  Financial Resource Strain: Patient Declined (01/04/2023)   Received [facility]  Social Connections: Patient Unable To Answer (07/01/2023)  Tobacco Use: Low Risk  (06/30/2023)    Readmission Risk Interventions     No data to display

## 2023-07-05 NOTE — Progress Notes (Signed)
 PROGRESS NOTE    Justin Bird  FMW:[id number] DOB: Jan 14, 1941 DOA: 06/29/2023 PCP: [name], MD    Brief Narrative:   83 y.o. male with medical history significant of hard of hearing, depression, Parkinson's disease, allergy, who presents with altered mental status.   Patient has AMS with agitation, is unable to provide any medical history.  I called her daughter by phone, who provided most of the medical history. Per her daughter, at her normal baseline, patient is oriented to person and place, usually is confused about the time.  In the past 4 days, patient has been confused.  Patient is agitated, yelling around the ED.  He moves all extremities, kicking around.  No active nausea, vomiting, diarrhea noted.  No fever.  No respiratory distress, active cough noted.  Does not seem to have chest pain or abdominal pain.  Patient possibly missed some dose of Sinemet .   Assessment & Plan:   Principal Problem:   Acute metabolic encephalopathy Active Problems:   Parkinson's disease (HCC)   Acute metabolic encephalopathy: Etiology is not clear.  CT head negative for acute intracranial abnormalities.  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  Potential differential diagnosis to include early stage of dementia, delirium,  B12 and TSH within normal limits.  Patient has possibly missed some dose of Sinemet , which may have contributed partially due to withdrawal.  Patient received IV Ativan , IV Haldol , IM Zyprexa  in ED.   Psychiatry engaged 11/4.  Added 3 times daily Seroquel , nightly trazodone  to regimen.  At this time patient not a candidate for inpatient psychiatric admission.  No clear metabolic cause identified.  Can consider spot EEG though clinical suspicion for underlying seizure disorder is low  Plan: Fall precautions Administer 3 times daily Sinemet  1/6 increased Seroquel  100 mg p.o. 3 times daily Nightly trazodone  IM Geodon  for agitation   Follow psychiatry  recommendations 1/7 reconsulted psych again. 1/8 started Lexapro  5 mg p.o. daily as per psych  Parkinson's disease (HCC) Continue Sinemet  3 times daily home dose  SNHL, s/p left cochlear implant 1/8 paged out to ENT for possible changing battery of cochlear implant   DVT prophylaxis: SQ Lovenox  Code Status: Full Family Communication:Son [name] [phone] on 1/7,  daughter Isaiah [phone] on 1/5 Disposition Plan: Status is: Inpatient Remains inpatient appropriate because: Acute metabolic encephalopathy   Level of care: Med-Surg  Consultants:  Psychiatry  Procedures:  None  Antimicrobials: None    Subjective: No significant overnight events.  Patient still very confused, altered mental status, unable to offer any complaints, talking randomly.  Objective: Vitals:   07/04/23 2005 07/04/23 2340 07/05/23 0426 07/05/23 0740  BP: (!) 145/80 (!) 183/73 (!) 155/100 (!) 146/78  Pulse: 99 81 91 72  Resp:  18 18 16   Temp: 98.6 F (37 C) 98.1 F (36.7 C) 98.7 F (37.1 C) 98.2 F (36.8 C)  TempSrc:   Oral Oral  SpO2: 94% 94%  100%    Intake/Output Summary (Last 24 hours) at 07/05/2023 1304 Last data filed at 07/05/2023 0900 Gross per 24 hour  Intake 260 ml  Output 550 ml  Net -290 ml    There were no vitals filed for this visit.  Examination:  General exam: NAD, confused and AMS, unable to offer any complaints Respiratory system: Bibasilar crackles.  Normal work of breathing.  Room air Cardiovascular system: S1-S2, RRR, no murmurs, no pedal edema Gastrointestinal system: Soft,/ND, normal bowel sounds Central nervous system: Agitated, confused, moving all extremities  spontaneously, no focal deficit Extremities: Unable to assess power Skin: No rashes, lesions or ulcers Psychiatry: Confused and agitated  Data Reviewed: I have personally reviewed following labs and imaging studies  CBC: Recent Labs  Lab 06/29/23 1447 07/03/23 0954 07/05/23 0508  WBC 8.3 7.9  7.5  HGB 15.6 15.4 15.1  HCT 47.3 46.5 45.3  MCV 89.8 89.4 90.1  PLT 628* 287 295   Basic Metabolic Panel: Recent Labs  Lab 06/29/23 1447 06/30/23 0422 07/03/23 0954 07/05/23 0508  NA 134* 137 141 145  K 3.7 3.4* 3.7 3.4*  CL 101 104 106 107  CO2 19* 23 19* 26  GLUCOSE 104* 106* 125* 97  BUN 17 16 22 15   CREATININE 0.58* 0.62 0.60* 0.60*  CALCIUM 9.0 8.6* 9.0 8.9  MG  --   --  2.2 2.2  PHOS  --   --  3.0 3.4   GFR: CrCl cannot be calculated (Unknown ideal weight.). Liver Function Tests: Recent Labs  Lab 06/29/23 1447  AST 21  ALT 10  ALKPHOS 47  BILITOT 1.1  PROT 7.5  ALBUMIN 4.5   No results for input(s): LIPASE, AMYLASE in the last 168 hours. No results for input(s): AMMONIA in the last 168 hours. Coagulation Profile: No results for input(s): INR, PROTIME in the last 168 hours. Cardiac Enzymes: No results for input(s): CKTOTAL, CKMB, CKMBINDEX, TROPONINI in the last 168 hours. BNP (last 3 results) No results for input(s): PROBNP in the last 8760 hours. HbA1C: No results for input(s): HGBA1C in the last 72 hours. CBG: Recent Labs  Lab 06/29/23 1858  GLUCAP 96   Lipid Profile: No results for input(s): CHOL, HDL, LDLCALC, TRIG, CHOLHDL, LDLDIRECT in the last 72 hours. Thyroid Function Tests: No results for input(s): TSH, T4TOTAL, FREET4, T3FREE, THYROIDAB in the last 72 hours.  Anemia Panel: No results for input(s): VITAMINB12, FOLATE, FERRITIN, TIBC, IRON, RETICCTPCT in the last 72 hours.  Sepsis Labs: Recent Labs  Lab 06/30/23 0422  PROCALCITON <0.10    Recent Results (from the past 240 hours)  SARS Coronavirus 2 by RT PCR (hospital order, performed in [facility] hospital lab) *cepheid single result test* Anterior Nasal Swab     Status: None   Collection Time: 06/30/23  9:06 AM   Specimen: Anterior Nasal Swab  Result Value Ref Range Status   SARS Coronavirus 2 by RT PCR NEGATIVE NEGATIVE  Final    Comment: (NOTE) SARS-CoV-2 target nucleic acids are NOT DETECTED.  The SARS-CoV-2 RNA is generally detectable in upper and lower respiratory specimens during the acute phase of infection. The lowest concentration of SARS-CoV-2 viral copies this assay can detect is 250 copies / mL. A negative result does not preclude SARS-CoV-2 infection and should not be used as the sole basis for treatment or other patient management decisions.  A negative result may occur with improper specimen collection / handling, submission of specimen other than nasopharyngeal swab, presence of viral mutation(s) within the areas targeted by this assay, and inadequate number of viral copies (<250 copies / mL). A negative result must be combined with clinical observations, patient history, and epidemiological information.  Fact Sheet for Patients:   roadlaptop.co.za  Fact Sheet for Healthcare Providers: http://kim-miller.com/  This test is not yet approved or  cleared by the [place]  FDA and has been authorized for detection and/or diagnosis of SARS-CoV-2 by FDA under an Emergency Use Authorization (EUA).  This EUA will remain in effect (meaning this test can be used) for  the duration of the COVID-19 declaration under Section 564(b)(1) of the Act, 21 U.S.C. section 360bbb-3(b)(1), unless the authorization is terminated or revoked sooner.  Performed at [facility], [address], [place], [place] [address]     Radiology Studies: No results found.   Scheduled Meds:  bisacodyl   10 mg Oral BID   Followed by   NOREEN ON 07/07/2023] bisacodyl   10 mg Oral QHS   bisacodyl   10 mg Rectal Once   carbidopa -levodopa   2 tablet Oral TID   cyanocobalamin   1,000 mcg Oral Daily   doxazosin   1 mg Oral Daily   enoxaparin  (LOVENOX ) injection  40 mg Subcutaneous Q24H   escitalopram   5 mg Oral Daily   feeding supplement  237 mL Oral BID BM    latanoprost   1 drop Both Eyes QHS   polyethylene glycol  17 g Oral BID   QUEtiapine   100 mg Oral TID   traZODone   150 mg Oral QHS   Continuous Infusions:  sodium chloride  75 mL/hr at 07/05/23 0835     LOS: 5 days    Time spent: 35 minutes  Elvan Sor, MD [facility]   If 7PM-7AM, please contact night-coverage  07/05/2023, 1:04 PM

## 2023-07-05 NOTE — TOC Progression Note (Signed)
 Transition of Care [facility]) - Progression Note    Patient Details  Name: Justin Bird MRN: [id number] Date of Birth: [birth year]  [facility] CM/SW Contact  Ladene Lady, LCSW Phone Number: 07/05/2023, 3:26 PM  Clinical Narrative:   [phone] FORBES NOTICE    Expected Discharge Plan: Skilled Nursing Facility Barriers to Discharge: Continued Medical Work up  Expected Discharge Plan and Services       Living arrangements for the past 2 months: Single Family Home                                       Social Determinants of Health (SDOH) Interventions SDOH Screenings   Food Insecurity: Patient Unable To Answer (07/01/2023)  Housing: Patient Unable To Answer (07/01/2023)  Transportation Needs: No Transportation Needs (07/01/2023)  Utilities: Not At Risk (07/01/2023)  Financial Resource Strain: Patient Declined (01/04/2023)   Received from [facility]  Social Connections: Patient Unable To Answer (07/01/2023)  Tobacco Use: Low Risk  (06/30/2023)    Readmission Risk Interventions     No data to display

## 2023-07-05 NOTE — Consult Note (Signed)
 [facility] Psychiatric Consult Follow-up  Patient Name: .Justin Bird  MRN: [id number]  DOB: Mar 06, 1941  Consult Order details:  Orders (From admission, onward)     Start     Ordered   07/04/23 1329  IP CONSULT TO PSYCHIATRY       Ordering Provider: Von Bellis, MD  Provider:  (Not yet assigned)  Question Answer Comment  Location [facility]   Reason for Consult? Agitation, AMS      07/04/23 1328            Mode of Visit: In person   Psychiatry Consult Evaluation  Service Date: July 05, 2023 LOS:  LOS: 5 days  Chief Complaint   Primary Psychiatric Diagnoses  Hx of depression/anxiety  Assessment  Justin Bird is a 83 y.o. male w/ history of hard of hearing, depression, anxiety, parkinsons, who was medically admitted on 06/29/2023 w/ concerns for acute metabolic encephalopathy.   On assessment today, pt is disoriented, unable to participate in assessment. Spoke w/ pt's son, [name], and discussed pt's presentation and psychotropics. See below.  Diagnoses:  Active Hospital problems: Principal Problem:   Acute metabolic encephalopathy Active Problems:   Parkinson's disease (HCC)   Plan   ## Psychiatric Medication Recommendations:  -continue seroquel  100mg  oral 3 times daily -continue trazodone  150mg  oral daily at bedtime -continue geodon  10mg  IM every 4 hours PRN agitation -start lexapro  5mg  oral daily  ## Medical Decision Making Capacity: Not specifically addressed in this encounter   ## Work-up:  -- defer further work-up to hospitalist -- most recent EKG on 06/29/23 had QtC of 442  ## Disposition: -- pt does not meet inpatient psychiatric hospitalization at this time  ## Behavioral / Environmental:  --Delirium Precautions: Delirium Interventions for Nursing and Staff: - RN to open blinds every AM. - To Bedside: Glasses, hearing aide, and pt's own shoes. Make available to patients. when possible and encourage use. - Encourage  po fluids when appropriate, keep fluids within reach. - OOB to chair with meals. - Passive ROM exercises to all extremities with AM & PM care. - RN to assess orientation to person, time and place QAM and PRN. - Recommend extended visitation hours with familiar family/friends as feasible. - Staff to minimize disturbances at night. Turn off television when pt asleep or when not in use.   ## Safety and Observation Level:  - Based on my clinical evaluation, I estimate the patient to be at LOW risk of self harm in the current setting.   CSSR Risk Category:C-SSRS RISK CATEGORY: No Risk  Thank you for this consult request. Recommendations have been communicated to the primary team.  We will continue to follow at this time.   [name], NP      History of Present Illness  Patient Report:  Pt is a very limited historian. Unable to provide any history at this time. When asked orientation questions, noted to be speaking incoherently.  Psych ROS:  Pt is a very limited historian. Unable to provide any history at this time. When asked orientation questions, noted to be speaking incoherently.  Collateral information:  Contacted Jamari Moten, pt's son, reported power of attorney, [phone]. Discussed pt's presentation today. Discussed pt's current psychotropic medications including seroquel  100mg  oral 3 times daily, trazodone  150mg  oral daily at bedtime, geodon  10mg  IM every 4 hours PRN agitation. Discussed with him it appears pt is not currently on his home medication lexapro  5mg  oral daily and re-starting this today. Discussed side  effects, adverse effects, and black box warnings of medications. Ryan verbalized understanding of risks vs benefits, felt that benefits outweighed risks, did agree for these medications. Ryan asked that social work reach out to him for long term plan for pt as he does not feel pt can live independently. He also requested that pt be provided batteries for his cochlear  implant as well as his readers.   ROS Unable to assess due to pt clinical presentation  Psychiatric and Social History  Psychiatric History:    Prev Dx/Sx: Per chart review, history of depression/anxiety Previous Med Trials: Per chart review, had been on lexapro  previously Prior Psych Hospitalization: Do not see history of psych hospitalization in chart   Exam Findings  Vital Signs:  Temp:  [97.9 F (36.6 C)-98.7 F (37.1 C)] 98.2 F (36.8 C) (01/08 0740) Pulse Rate:  [72-99] 72 (01/08 0740) Resp:  [16-18] 16 (01/08 0740) BP: (145-183)/(65-100) 146/78 (01/08 0740) SpO2:  [94 %-100 %] 100 % (01/08 0740) Blood pressure (!) 146/78, pulse 72, temperature 98.2 F (36.8 C), temperature source Oral, resp. rate 16, SpO2 100%. There is no height or weight on file to calculate BMI.  Mental Status Exam: General Appearance: Disheveled  Orientation:  Other:  disoriented  Memory:  Immediate;   Poor  Concentration:  Concentration: Poor and Attention Span: Poor  Recall:  Poor  Attention  Poor  Eye Contact:  Poor  Speech:  incoherent  Language:  Poor  Volume:  Decreased  Mood: unable to assess  Affect:  confused  Thought Process:  Disorganized  Thought Content:  unable to assess  Suicidal Thoughts:  unable to assess  Homicidal Thoughts:  unable to assess  Judgement:  Impaired  Insight:  Lacking  Psychomotor Activity:  Restlessness    Fund of Knowledge:  Poor      Assets:  Financial Resources/Insurance Resilience Social Support  Cognition:  Impaired,  Severe  ADL's:  Impaired  AIMS (if indicated):      Other History   These have been pulled in through the EMR, reviewed, and updated if appropriate.  Family History:  The patient's family history includes Lung cancer in his mother.  Medical History: Past Medical History:  Diagnosis Date  . Allergy   . Hearing deficit   . Parkinson's disease [facility])    Surgical History: Past Surgical History:  Procedure Laterality Date   . COCHLEAR IMPLANT Left   . PARTIAL COLECTOMY     Medications:   Current Facility-Administered Medications:  .  0.9 %  sodium chloride  infusion, , Intravenous, Continuous, Von Bellis, MD .  acetaminophen  (TYLENOL ) tablet 650 mg, 650 mg, Oral, Q6H PRN, Niu, Xilin, MD, 325 mg at 06/29/23 2127 .  bisacodyl  (DULCOLAX) EC tablet 10 mg, 10 mg, Oral, BID **FOLLOWED BY** [START ON 07/07/2023] bisacodyl  (DULCOLAX) EC tablet 10 mg, 10 mg, Oral, QHS, Kumar, Dileep, MD .  bisacodyl  (DULCOLAX) suppository 10 mg, 10 mg, Rectal, Daily PRN, Von Bellis, MD .  carbidopa -levodopa  (SINEMET  IR) 25-100 MG per tablet immediate release 2 tablet, 2 tablet, Oral, TID, Goodman, Graydon, MD, 2 tablet at 07/04/23 2126 .  cyanocobalamin  (VITAMIN B12) tablet 1,000 mcg, 1,000 mcg, Oral, Daily, Niu, Xilin, MD, 1,000 mcg at 07/04/23 0813 .  enoxaparin  (LOVENOX ) injection 40 mg, 40 mg, Subcutaneous, Q24H, Niu, Xilin, MD, 40 mg at 07/04/23 2126 .  feeding supplement (ENSURE ENLIVE / ENSURE PLUS) liquid 237 mL, 237 mL, Oral, BID BM, Von Bellis, MD .  hydrALAZINE  (APRESOLINE ) injection 5 mg,  5 mg, Intravenous, Q2H PRN, Niu, Xilin, MD .  latanoprost  (XALATAN ) 0.005 % ophthalmic solution 1 drop, 1 drop, Both Eyes, QHS, Niu, Xilin, MD, 1 drop at 07/04/23 2127 .  ondansetron  (ZOFRAN ) injection 4 mg, 4 mg, Intravenous, Q8H PRN, Niu, Xilin, MD .  polyethylene glycol (MIRALAX  / GLYCOLAX ) packet 17 g, 17 g, Oral, BID, Von Bellis, MD, 17 g at 07/04/23 2129 .  polyvinyl alcohol  (LIQUIFILM TEARS) 1.4 % ophthalmic solution 2 drop, 2 drop, Both Eyes, PRN, Sreenath, Sudheer B, MD .  QUEtiapine  (SEROQUEL ) tablet 100 mg, 100 mg, Oral, TID, Von Bellis, MD, 100 mg at 07/04/23 2145 .  traZODone  (DESYREL ) tablet 150 mg, 150 mg, Oral, QHS, Sreenath, Sudheer B, MD, 150 mg at 07/04/23 2126 .  ziprasidone  (GEODON ) injection 10 mg, 10 mg, Intramuscular, Q4H PRN, Jhonny Sahara B, MD, 10 mg at 07/04/23 2135  Allergies: Allergies   Allergen Reactions  . Opium Poppy [Papaver] Anaphylaxis and Hives  . Penicillins Anaphylaxis and Rash  . Sulfa Antibiotics Anaphylaxis  . Penicillins   . Sulfa Antibiotics    Corlis Angelica Eun Xariah Silvernail, NP

## 2023-07-06 DIAGNOSIS — G9341 Metabolic encephalopathy: Secondary | ICD-10-CM | POA: Diagnosis not present

## 2023-07-06 LAB — BASIC METABOLIC PANEL
Anion gap: 11 (ref 5–15)
BUN: 11 mg/dL (ref 8–23)
CO2: 24 mmol/L (ref 22–32)
Calcium: 8.4 mg/dL — ABNORMAL LOW (ref 8.9–10.3)
Chloride: 105 mmol/L (ref 98–111)
Creatinine, Ser: 0.63 mg/dL (ref 0.61–1.24)
GFR, Estimated: >60 mL/min (ref >60)
Glucose, Bld: 106 mg/dL — ABNORMAL HIGH (ref 70–99)
Potassium: 3.4 mmol/L — ABNORMAL LOW (ref 3.5–5.1)
Sodium: 140 mmol/L (ref 135–145)

## 2023-07-06 LAB — PHOSPHORUS: Phosphorus: 3.1 mg/dL (ref 2.5–4.6)

## 2023-07-06 LAB — MAGNESIUM: Magnesium: 2.1 mg/dL (ref 1.7–2.4)

## 2023-07-06 LAB — CBC
HCT: 43.4 % (ref 39.0–52.0)
Hemoglobin: 14.6 g/dL (ref 13.0–17.0)
MCH: 30.4 pg (ref 26.0–34.0)
MCHC: 33.6 g/dL (ref 30.0–36.0)
MCV: 90.4 fL (ref 80.0–100.0)
Platelets: 258 10*3/uL (ref 150–400)
RBC: 4.8 MIL/uL (ref 4.22–5.81)
RDW: 12.5 % (ref 11.5–15.5)
WBC: 7 10*3/uL (ref 4.0–10.5)
nRBC: 0 % (ref 0.0–0.2)

## 2023-07-06 MED ORDER — POTASSIUM CHLORIDE CRYS ER 20 MEQ PO TBCR
40.0000 meq | EXTENDED_RELEASE_TABLET | Freq: Once | ORAL | Status: AC
  Administered 2023-07-06: 40 meq via ORAL
  Filled 2023-07-06: qty 2

## 2023-07-06 NOTE — Plan of Care (Signed)
  Problem: Safety: Goal: Non-violent Restraint(s) Outcome: Progressing   Problem: Clinical Measurements: Goal: Ability to maintain clinical measurements within normal limits will improve Outcome: Progressing Goal: Will remain free from infection Outcome: Progressing Goal: Diagnostic test results will improve Outcome: Progressing Goal: Respiratory complications will improve Outcome: Progressing Goal: Cardiovascular complication will be avoided Outcome: Progressing   Problem: Elimination: Goal: Will not experience complications related to bowel motility Outcome: Progressing Goal: Will not experience complications related to urinary retention Outcome: Progressing   Problem: Pain Management: Goal: General experience of comfort will improve Outcome: Progressing   Problem: Safety: Goal: Ability to remain free from injury will improve Outcome: Progressing   Problem: Skin Integrity: Goal: Risk for impaired skin integrity will decrease Outcome: Progressing

## 2023-07-06 NOTE — Progress Notes (Signed)
 PROGRESS NOTE    Justin Bird  FMW:[id number] DOB: Nov 12, 1940 DOA: 06/29/2023 PCP: [name], MD    Brief Narrative:   83 y.o. male with medical history significant of hard of hearing, depression, Parkinson's disease, allergy, who presents with altered mental status.   Patient has AMS with agitation, is unable to provide any medical history.  I called her daughter by phone, who provided most of the medical history. Per her daughter, at her normal baseline, patient is oriented to person and place, usually is confused about the time.  In the past 4 days, patient has been confused.  Patient is agitated, yelling around the ED.  He moves all extremities, kicking around.  No active nausea, vomiting, diarrhea noted.  No fever.  No respiratory distress, active cough noted.  Does not seem to have chest pain or abdominal pain.  Patient possibly missed some dose of Sinemet .   Assessment & Plan:   Principal Problem:   Acute metabolic encephalopathy Active Problems:   Parkinson's disease (HCC)   Acute metabolic encephalopathy: Etiology is not clear.  CT head negative for acute intracranial abnormalities.  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  Potential differential diagnosis to include early stage of dementia, delirium,  B12 and TSH within normal limits.  Patient has possibly missed some dose of Sinemet , which may have contributed partially due to withdrawal.  Patient received IV Ativan , IV Haldol , IM Zyprexa  in ED.   Psychiatry engaged 11/4.  Added 3 times daily Seroquel , nightly trazodone  to regimen.  At this time patient not a candidate for inpatient psychiatric admission.  No clear metabolic cause identified.  Can consider spot EEG though clinical suspicion for underlying seizure disorder is low  Plan: Fall precautions Administer 3 times daily Sinemet  1/6 increased Seroquel  100 mg p.o. 3 times daily Nightly trazodone  IM Geodon  for agitation   Follow psychiatry  recommendations 1/7 reconsulted psych again. 1/8 started Lexapro  5 mg p.o. daily as per psych  Parkinson's disease (HCC) Continue Sinemet  3 times daily home dose  SNHL, s/p left cochlear implant 1/8 paged out to ENT for possible changing battery of cochlear implant. I did not get any call back 1/9 secure chat with the ENT Dr Milissa, done today   DVT prophylaxis: SQ Lovenox  Code Status: Full Family Communication:Son [name] [phone] on 1/7,  daughter Isaiah [phone] on 1/5 Disposition Plan: Status is: Inpatient Remains inpatient appropriate because: Acute metabolic encephalopathy   Level of care: Med-Surg  Consultants:  Psychiatry  Procedures:  None  Antimicrobials: None    Subjective: No significant overnight events.  During my rounds patient was sleepy, briefly open eyes and then closed, seems resting calmly, without any acute distress.  Patient has mittens on bilateral hands.   Objective: Vitals:   07/05/23 1612 07/05/23 1957 07/06/23 0510 07/06/23 0835  BP: (!) 148/74 (!) 152/82 (!) 144/93 (!) 158/76  Pulse: 85 88 79 76  Resp: 18 18 18 18   Temp: 98.3 F (36.8 C) 98.4 F (36.9 C) 98.8 F (37.1 C) 97.9 F (36.6 C)  TempSrc: Oral     SpO2: 98% 99% 100% 97%    Intake/Output Summary (Last 24 hours) at 07/06/2023 1357 Last data filed at 07/06/2023 1320 Gross per 24 hour  Intake 1360 ml  Output 1475 ml  Net -115 ml    There were no vitals filed for this visit.  Examination:  General exam: NAD, sleepy, resting comfortably Respiratory system: Bibasilar crackles.  Normal work of breathing.  Room air Cardiovascular system: S1-S2, RRR, no murmurs, no pedal edema Gastrointestinal system: Soft,/ND, normal bowel sounds Central nervous system: sleepy, no focal deficits Extremities: Unable to assess power Skin: No rashes, lesions or ulcers Psychiatry: Confused and agitated  Data Reviewed: I have personally reviewed following labs and imaging  studies  CBC: Recent Labs  Lab 06/29/23 1447 07/03/23 0954 07/05/23 0508 07/06/23 0424  WBC 8.3 7.9 7.5 7.0  HGB 15.6 15.4 15.1 14.6  HCT 47.3 46.5 45.3 43.4  MCV 89.8 89.4 90.1 90.4  PLT 628* 287 295 258   Basic Metabolic Panel: Recent Labs  Lab 06/29/23 1447 06/30/23 0422 07/03/23 0954 07/05/23 0508 07/06/23 0424  NA 134* 137 141 145 140  K 3.7 3.4* 3.7 3.4* 3.4*  CL 101 104 106 107 105  CO2 19* 23 19* 26 24  GLUCOSE 104* 106* 125* 97 106*  BUN 17 16 22 15 11   CREATININE 0.58* 0.62 0.60* 0.60* 0.63  CALCIUM 9.0 8.6* 9.0 8.9 8.4*  MG  --   --  2.2 2.2 2.1  PHOS  --   --  3.0 3.4 3.1   GFR: CrCl cannot be calculated (Unknown ideal weight.). Liver Function Tests: Recent Labs  Lab 06/29/23 1447  AST 21  ALT 10  ALKPHOS 47  BILITOT 1.1  PROT 7.5  ALBUMIN 4.5   No results for input(s): LIPASE, AMYLASE in the last 168 hours. No results for input(s): AMMONIA in the last 168 hours. Coagulation Profile: No results for input(s): INR, PROTIME in the last 168 hours. Cardiac Enzymes: No results for input(s): CKTOTAL, CKMB, CKMBINDEX, TROPONINI in the last 168 hours. BNP (last 3 results) No results for input(s): PROBNP in the last 8760 hours. HbA1C: No results for input(s): HGBA1C in the last 72 hours. CBG: Recent Labs  Lab 06/29/23 1858  GLUCAP 96   Lipid Profile: No results for input(s): CHOL, HDL, LDLCALC, TRIG, CHOLHDL, LDLDIRECT in the last 72 hours. Thyroid Function Tests: No results for input(s): TSH, T4TOTAL, FREET4, T3FREE, THYROIDAB in the last 72 hours.  Anemia Panel: No results for input(s): VITAMINB12, FOLATE, FERRITIN, TIBC, IRON, RETICCTPCT in the last 72 hours.  Sepsis Labs: Recent Labs  Lab 06/30/23 0422  PROCALCITON <0.10    Recent Results (from the past 240 hours)  SARS Coronavirus 2 by RT PCR (hospital order, performed in [facility] hospital lab) *cepheid single result test*  Anterior Nasal Swab     Status: None   Collection Time: 06/30/23  9:06 AM   Specimen: Anterior Nasal Swab  Result Value Ref Range Status   SARS Coronavirus 2 by RT PCR NEGATIVE NEGATIVE Final    Comment: (NOTE) SARS-CoV-2 target nucleic acids are NOT DETECTED.  The SARS-CoV-2 RNA is generally detectable in upper and lower respiratory specimens during the acute phase of infection. The lowest concentration of SARS-CoV-2 viral copies this assay can detect is 250 copies / mL. A negative result does not preclude SARS-CoV-2 infection and should not be used as the sole basis for treatment or other patient management decisions.  A negative result may occur with improper specimen collection / handling, submission of specimen other than nasopharyngeal swab, presence of viral mutation(s) within the areas targeted by this assay, and inadequate number of viral copies (<250 copies / mL). A negative result must be combined with clinical observations, patient history, and epidemiological information.  Fact Sheet for Patients:   roadlaptop.co.za  Fact Sheet for Healthcare Providers: http://kim-miller.com/  This test is not yet approved or  cleared by the [place]  FDA and has been authorized for detection and/or diagnosis of SARS-CoV-2 by FDA under an Emergency Use Authorization (EUA).  This EUA will remain in effect (meaning this test can be used) for the duration of the COVID-19 declaration under Section 564(b)(1) of the Act, 21 U.S.C. section 360bbb-3(b)(1), unless the authorization is terminated or revoked sooner.  Performed at [facility], [address], [place], [place] [address]     Radiology Studies: No results found.   Scheduled Meds:  bisacodyl   10 mg Oral BID   Followed by   NOREEN ON 07/07/2023] bisacodyl   10 mg Oral QHS   carbidopa -levodopa   2 tablet Oral TID   Chlorhexidine  Gluconate Cloth  6 each Topical Daily    cyanocobalamin   1,000 mcg Oral Daily   doxazosin   1 mg Oral Daily   enoxaparin  (LOVENOX ) injection  40 mg Subcutaneous Q24H   escitalopram   5 mg Oral Daily   feeding supplement  237 mL Oral BID BM   latanoprost   1 drop Both Eyes QHS   polyethylene glycol  17 g Oral BID   QUEtiapine   100 mg Oral TID   traZODone   150 mg Oral QHS   Continuous Infusions:  sodium chloride  75 mL/hr at 07/06/23 0623     LOS: 6 days    Time spent: 35 minutes  Elvan Sor, MD [facility]   If 7PM-7AM, please contact night-coverage  07/06/2023, 1:57 PM

## 2023-07-06 NOTE — TOC Progression Note (Signed)
 Transition of Care [facility]) - Progression Note    Patient Details  Name: Justin Bird MRN: [id number] Date of Birth: 08-Mar-1941  Transition of Care [facility]) CM/SW Contact  Ladene Lady, LCSW Phone Number: 07/06/2023, 1:48 PM  Clinical Narrative:   Son has given permission for me to speak with patients care manager. Info below. CSW has left a VM.   Delon [facility] community geriatric care manager [phone].    Expected Discharge Plan: Skilled Nursing Facility Barriers to Discharge: Continued Medical Work up  Expected Discharge Plan and Services       Living arrangements for the past 2 months: Single Family Home                                       Social Determinants of Health (SDOH) Interventions SDOH Screenings   Food Insecurity: Patient Unable To Answer (07/01/2023)  Housing: Patient Unable To Answer (07/01/2023)  Transportation Needs: No Transportation Needs (07/01/2023)  Utilities: Not At Risk (07/01/2023)  Financial Resource Strain: Patient Declined (01/04/2023)   Received from [facility]  Social Connections: Patient Unable To Answer (07/01/2023)  Tobacco Use: Low Risk  (06/30/2023)    Readmission Risk Interventions     No data to display

## 2023-07-06 NOTE — Progress Notes (Signed)
 Physical Therapy Treatment Patient Details Name: Justin Bird MRN: [id number] DOB: 03/06/1941 Today's Date: 07/06/2023   History of Present Illness Pt is an 83 y/o M admitted on 06/29/23 after presenting with AMS. Head CT negative for acute issues (unable to obtain MRI 2/2 cochlear implant). PMH: HOH with cochlear implant, depression, Parkinson's disease    PT Comments  Pt received in supine position and agreeable to therapy.  Pt very hard of hearing throughout, however responded better with clear and slowed speech from therapist.  Pt was able to follow commands (ie: bring your legs to side of bed, sit at edge of bed, stand up).  Pt required tactile feedback for hand placement through transfer training, however was able to perform with increased time.  Pt able to stand and with modA able to keep balance.  Pt very impulsive throughout the session, reaching for the bed, the sink and catheter while in standing.  Pt stood ~4-5 minutes before returning to bed.  Pt unable to advance feet forward when instructed to do so, unaware if this was cognition related or lack of muscular strength to do so.  Pt assisted back into bed with mittens placed back on hands and call bell within reach.     If plan is discharge home, recommend the following: Two people to help with walking and/or transfers;Two people to help with bathing/dressing/bathroom;Direct supervision/assist for medications management;Help with stairs or ramp for entrance;Assist for transportation;Assistance with feeding;Direct supervision/assist for financial management;Assistance with cooking/housework;Supervision due to cognitive status   Can travel by private vehicle     No  Equipment Recommendations  Other (comment)    Recommendations for Other Services       Precautions / Restrictions Precautions Precautions: Fall Precaution Comments: pt does not appear to be able to hear or see well. Restrictions Weight Bearing Restrictions Per  Provider Order: No     Mobility  Bed Mobility Overal bed mobility: Needs Assistance Bed Mobility: Supine to Sit, Sit to Supine     Supine to sit: Mod assist Sit to supine: Mod assist   General bed mobility comments: difficulty understanding instructions and seeing where PT was instructing him to put hands for scooting forward to the edge of the bed.    Transfers Overall transfer level: Needs assistance Equipment used: Rolling walker (2 wheels), 2 person hand held assist Transfers: Sit to/from Stand Sit to Stand: Mod assist           General transfer comment: Pt able to perform transfer to EOB and standing with use of the walker, however had to rely on tactile cuing for hand placement.    Ambulation/Gait               General Gait Details: deferred as pt was very unsteady in standing.   Stairs             Wheelchair Mobility     Tilt Bed    Modified Rankin (Stroke Patients Only)       Balance Overall balance assessment: Needs assistance Sitting-balance support: Bilateral upper extremity supported, Feet unsupported Sitting balance-Leahy Scale: Fair     Standing balance support: Bilateral upper extremity supported, Reliant on assistive device for balance Standing balance-Leahy Scale: Poor                              Cognition Arousal: Alert Behavior During Therapy: Impulsive Overall Cognitive Status: No family/caregiver present to determine baseline cognitive functioning  General Comments: Pt confused throughout the session, however has moments of clarity.  Pt very HOH and difficult to communicate with due to not understanding therapist.  Pt responded better with slowed/clear speech.        Exercises      General Comments        Pertinent Vitals/Pain Pain Assessment Pain Assessment: No/denies pain    Home Living                          Prior Function             PT Goals (current goals can now be found in the care plan section) Acute Rehab PT Goals PT Goal Formulation: Patient unable to participate in goal setting Time For Goal Achievement: 07/14/23 Potential to Achieve Goals: Fair Progress towards PT goals: Progressing toward goals    Frequency    Min 1X/week      PT Plan      Co-evaluation              AM-PAC PT 6 Clicks Mobility   Outcome Measure  Help needed turning from your back to your side while in a flat bed without using bedrails?: A Lot Help needed moving from lying on your back to sitting on the side of a flat bed without using bedrails?: A Lot Help needed moving to and from a bed to a chair (including a wheelchair)?: Total Help needed standing up from a chair using your arms (e.g., wheelchair or bedside chair)?: A Lot Help needed to walk in hospital room?: Total Help needed climbing 3-5 steps with a railing? : Total 6 Click Score: 9    End of Session Equipment Utilized During Treatment: Gait belt Activity Tolerance: Other (comment) (limited by cognition) Patient left: in bed;with call bell/phone within reach;with bed alarm set Nurse Communication: Mobility status PT Visit Diagnosis: Muscle weakness (generalized) (M62.81);Other abnormalities of gait and mobility (R26.89);Difficulty in walking, not elsewhere classified (R26.2)     Time: 1216-1232 PT Time Calculation (min) (ACUTE ONLY): 16 min  Charges:    $Therapeutic Activity: 8-22 mins PT General Charges $$ ACUTE PT VISIT: 1 Visit                     Fonda Simpers, PT, DPT Physical Therapist - [facility]  07/06/23, 2:08 PM

## 2023-07-07 DIAGNOSIS — G9341 Metabolic encephalopathy: Secondary | ICD-10-CM | POA: Diagnosis not present

## 2023-07-07 LAB — BASIC METABOLIC PANEL
Anion gap: 10 (ref 5–15)
BUN: 9 mg/dL (ref 8–23)
CO2: 25 mmol/L (ref 22–32)
Calcium: 8.8 mg/dL — ABNORMAL LOW (ref 8.9–10.3)
Chloride: 106 mmol/L (ref 98–111)
Creatinine, Ser: 0.58 mg/dL — ABNORMAL LOW (ref 0.61–1.24)
GFR, Estimated: >60 mL/min (ref >60)
Glucose, Bld: 90 mg/dL (ref 70–99)
Potassium: 4.3 mmol/L (ref 3.5–5.1)
Sodium: 141 mmol/L (ref 135–145)

## 2023-07-07 LAB — CBC
HCT: 47.9 % (ref 39.0–52.0)
Hemoglobin: 16 g/dL (ref 13.0–17.0)
MCH: 30.3 pg (ref 26.0–34.0)
MCHC: 33.4 g/dL (ref 30.0–36.0)
MCV: 90.7 fL (ref 80.0–100.0)
Platelets: 263 10*3/uL (ref 150–400)
RBC: 5.28 MIL/uL (ref 4.22–5.81)
RDW: 12.6 % (ref 11.5–15.5)
WBC: 6.3 10*3/uL (ref 4.0–10.5)
nRBC: 0 % (ref 0.0–0.2)

## 2023-07-07 LAB — MAGNESIUM: Magnesium: 2.1 mg/dL (ref 1.7–2.4)

## 2023-07-07 LAB — PHOSPHORUS: Phosphorus: 2.9 mg/dL (ref 2.5–4.6)

## 2023-07-07 NOTE — Progress Notes (Signed)
 PROGRESS NOTE    Justin Bird  FMW:[id number] DOB: 03-07-1941 DOA: 06/29/2023 PCP: Justin Lenis, MD    Brief Narrative:   83 y.o. male with medical history significant of hard of hearing, depression, Parkinson's disease, allergy, who presents with altered mental status.   Patient has AMS with agitation, is unable to provide any medical history.  I called her daughter by phone, who provided most of the medical history. Per her daughter, at her normal baseline, patient is oriented to person and place, usually is confused about the time.  In the past 4 days, patient has been confused.  Patient is agitated, yelling around the ED.  He moves all extremities, kicking around.  No active nausea, vomiting, diarrhea noted.  No fever.  No respiratory distress, active cough noted.  Does not seem to have chest pain or abdominal pain.  Patient possibly missed some dose of Sinemet .   Assessment & Plan:   Principal Problem:   Acute metabolic encephalopathy Active Problems:   Parkinson's disease (HCC)   Acute metabolic encephalopathy: Etiology is not clear.  CT head negative for acute intracranial abnormalities.  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  Potential differential diagnosis to include early stage of dementia, delirium,  B12 and TSH within normal limits.  Patient has possibly missed some dose of Sinemet , which may have contributed partially due to withdrawal.  Patient received IV Ativan , IV Haldol , IM Zyprexa  in ED.   Psychiatry engaged 11/4.  Added 3 times daily Seroquel , nightly trazodone  to regimen.  At this time patient not a candidate for inpatient psychiatric admission.  No clear metabolic cause identified.  Can consider spot EEG though clinical suspicion for underlying seizure disorder is low  Plan: Fall precautions Administer 3 times daily Sinemet  1/6 increased Seroquel  100 mg p.o. 3 times daily Nightly trazodone  IM Geodon  for agitation   Follow psychiatry  recommendations 1/7 reconsulted psych again. 1/8 started Lexapro  5 mg p.o. daily as per psych  Parkinson's disease  Continue Sinemet  3 times daily home dose  SNHL, s/p left cochlear implant 1/8 paged out to ENT for possible changing battery of cochlear implant. I did not get any call back 1/9 secure chat with the ENT Dr Milissa, he replied back we cannot check echocardiogram plan bilaterally, patient is to follow outpatient with aortic labs done. 1/10 patient has significant sensorineural deafness, unable to hear anything!   DVT prophylaxis: SQ Lovenox  Code Status: Full Family Communication:Justin Bird [phone] on 1/7,  daughter Justin Bird [phone] on 1/5 Disposition Plan: Status is: Inpatient Remains inpatient appropriate because: Acute metabolic encephalopathy   Level of care: Med-Surg  Consultants:  Psychiatry  Procedures:  None  Antimicrobials: None    Subjective: No significant overnight events.  Patient remained confused, talking randomly and mumbling, hard to understand. No acute distress noticed.  Wearing mittens in bilateral hands   Objective: Vitals:   07/06/23 0835 07/06/23 1609 07/06/23 2109 07/07/23 0836  BP: (!) 158/76 (!) 148/68 (!) 157/85 (!) 140/71  Pulse: 76 85 83 75  Resp: 18 18 18 20   Temp: 97.9 F (36.6 C) 98.1 F (36.7 C) 98.1 F (36.7 C) 98.7 F (37.1 C)  TempSrc:      SpO2: 97% 91% 97%     Intake/Output Summary (Last 24 hours) at 07/07/2023 1059 Last data filed at 07/06/2023 1700 Gross per 24 hour  Intake 723 ml  Output 800 ml  Net -77 ml    There were no vitals filed for this  visit.  Examination:  General exam: NAD, resting comfortably Respiratory system: Bibasilar crackles.  Normal work of breathing.  Room air Cardiovascular system: S1-S2, RRR, no murmurs, no pedal edema Gastrointestinal system: Soft,/ND, normal bowel sounds Central nervous system: sleepy, no focal deficits Extremities: Unable to assess power Skin: No  rashes, lesions or ulcers Psychiatry: Confused and agitated  Data Reviewed: I have personally reviewed following labs and imaging studies  CBC: Recent Labs  Lab 07/03/23 0954 07/05/23 0508 07/06/23 0424 07/07/23 0955  WBC 7.9 7.5 7.0 6.3  HGB 15.4 15.1 14.6 16.0  HCT 46.5 45.3 43.4 47.9  MCV 89.4 90.1 90.4 90.7  PLT 287 295 258 263   Basic Metabolic Panel: Recent Labs  Lab 07/03/23 0954 07/05/23 0508 07/06/23 0424 07/07/23 0955  NA 141 145 140 141  K 3.7 3.4* 3.4* 4.3  CL 106 107 105 106  CO2 19* 26 24 25   GLUCOSE 125* 97 106* 90  BUN 22 15 11 9   CREATININE 0.60* 0.60* 0.63 0.58*  CALCIUM 9.0 8.9 8.4* 8.8*  MG 2.2 2.2 2.1 2.1  PHOS 3.0 3.4 3.1 2.9   GFR: CrCl cannot be calculated (Unknown ideal weight.). Liver Function Tests: No results for input(s): AST, ALT, ALKPHOS, BILITOT, PROT, ALBUMIN in the last 168 hours.  No results for input(s): LIPASE, AMYLASE in the last 168 hours. No results for input(s): AMMONIA in the last 168 hours. Coagulation Profile: No results for input(s): INR, PROTIME in the last 168 hours. Cardiac Enzymes: No results for input(s): CKTOTAL, CKMB, CKMBINDEX, TROPONINI in the last 168 hours. BNP (last 3 results) No results for input(s): PROBNP in the last 8760 hours. HbA1C: No results for input(s): HGBA1C in the last 72 hours. CBG: No results for input(s): GLUCAP in the last 168 hours.  Lipid Profile: No results for input(s): CHOL, HDL, LDLCALC, TRIG, CHOLHDL, LDLDIRECT in the last 72 hours. Thyroid Function Tests: No results for input(s): TSH, T4TOTAL, FREET4, T3FREE, THYROIDAB in the last 72 hours.  Anemia Panel: No results for input(s): VITAMINB12, FOLATE, FERRITIN, TIBC, IRON, RETICCTPCT in the last 72 hours.  Sepsis Labs: No results for input(s): PROCALCITON, LATICACIDVEN in the last 168 hours.   Recent Results (from the past 240 hours)  SARS  Coronavirus 2 by RT PCR (hospital order, performed in [facility] hospital lab) *cepheid single result test* Anterior Nasal Swab     Status: None   Collection Time: 06/30/23  9:06 AM   Specimen: Anterior Nasal Swab  Result Value Ref Range Status   SARS Coronavirus 2 by RT PCR NEGATIVE NEGATIVE Final    Comment: (NOTE) SARS-CoV-2 target nucleic acids are NOT DETECTED.  The SARS-CoV-2 RNA is generally detectable in upper and lower respiratory specimens during the acute phase of infection. The lowest concentration of SARS-CoV-2 viral copies this assay can detect is 250 copies / mL. A negative result does not preclude SARS-CoV-2 infection and should not be used as the sole basis for treatment or other patient management decisions.  A negative result may occur with improper specimen collection / handling, submission of specimen other than nasopharyngeal swab, presence of viral mutation(s) within the areas targeted by this assay, and inadequate number of viral copies (<250 copies / mL). A negative result must be combined with clinical observations, patient history, and epidemiological information.  Fact Sheet for Patients:   roadlaptop.co.za  Fact Sheet for Healthcare Providers: http://kim-miller.com/  This test is not yet approved or  cleared by the [place]  FDA and has been  authorized for detection and/or diagnosis of SARS-CoV-2 by FDA under an Emergency Use Authorization (EUA).  This EUA will remain in effect (meaning this test can be used) for the duration of the COVID-19 declaration under Section 564(b)(1) of the Act, 21 U.S.C. section 360bbb-3(b)(1), unless the authorization is terminated or revoked sooner.  Performed at [facility], [address], [place], [place] [address]     Radiology Studies: No results found.   Scheduled Meds:  bisacodyl   10 mg Oral QHS   carbidopa -levodopa   2 tablet Oral TID    Chlorhexidine  Gluconate Cloth  6 each Topical Daily   cyanocobalamin   1,000 mcg Oral Daily   doxazosin   1 mg Oral Daily   enoxaparin  (LOVENOX ) injection  40 mg Subcutaneous Q24H   escitalopram   5 mg Oral Daily   feeding supplement  237 mL Oral BID BM   latanoprost   1 drop Both Eyes QHS   polyethylene glycol  17 g Oral BID   QUEtiapine   100 mg Oral TID   traZODone   150 mg Oral QHS   Continuous Infusions:  sodium chloride  75 mL/hr at 07/07/23 0554     LOS: 7 days    Time spent: 35 minutes  Elvan Sor, MD [facility]   If 7PM-7AM, please contact night-coverage  07/07/2023, 10:59 AM

## 2023-07-07 NOTE — Plan of Care (Signed)
  Problem: Safety: Goal: Non-violent Restraint(s) Outcome: Progressing   Problem: Education: Goal: Knowledge of General Education information will improve Description: Including pain rating scale, medication(s)/side effects and non-pharmacologic comfort measures Outcome: Progressing   Problem: Clinical Measurements: Goal: Respiratory complications will improve Outcome: Progressing   Problem: Pain Management: Goal: General experience of comfort will improve Outcome: Progressing

## 2023-07-07 NOTE — Progress Notes (Signed)
 OT Cancellation Note  Patient Details Name: Justin Bird MRN: [id number] DOB: December 09, 1940   Cancelled Treatment:    Reason Eval/Treat Not Completed: Other (comment). Upon attempt, pt receiving care from nursing and unavailable for OT tx. Will re-attempt as schedule permits.   Litsy Epting R., MPH, MS, OTR/L ascom [phone] 07/07/23, 12:30 PM

## 2023-07-08 DIAGNOSIS — G9341 Metabolic encephalopathy: Secondary | ICD-10-CM | POA: Diagnosis not present

## 2023-07-08 DIAGNOSIS — G20A1 Parkinson's disease without dyskinesia, without mention of fluctuations: Secondary | ICD-10-CM | POA: Diagnosis not present

## 2023-07-08 LAB — CBC
HCT: 46.1 % (ref 39.0–52.0)
Hemoglobin: 15.5 g/dL (ref 13.0–17.0)
MCH: 30 pg (ref 26.0–34.0)
MCHC: 33.6 g/dL (ref 30.0–36.0)
MCV: 89.3 fL (ref 80.0–100.0)
Platelets: 287 10*3/uL (ref 150–400)
RBC: 5.16 MIL/uL (ref 4.22–5.81)
RDW: 12.3 % (ref 11.5–15.5)
WBC: 9 10*3/uL (ref 4.0–10.5)
nRBC: 0 % (ref 0.0–0.2)

## 2023-07-08 LAB — BASIC METABOLIC PANEL
Anion gap: 9 (ref 5–15)
BUN: 10 mg/dL (ref 8–23)
CO2: 24 mmol/L (ref 22–32)
Calcium: 8.6 mg/dL — ABNORMAL LOW (ref 8.9–10.3)
Chloride: 102 mmol/L (ref 98–111)
Creatinine, Ser: 0.6 mg/dL — ABNORMAL LOW (ref 0.61–1.24)
GFR, Estimated: >60 mL/min (ref >60)
Glucose, Bld: 81 mg/dL (ref 70–99)
Potassium: 3.5 mmol/L (ref 3.5–5.1)
Sodium: 135 mmol/L (ref 135–145)

## 2023-07-08 MED ORDER — POTASSIUM CHLORIDE CRYS ER 20 MEQ PO TBCR
40.0000 meq | EXTENDED_RELEASE_TABLET | Freq: Once | ORAL | Status: AC
  Administered 2023-07-08: 40 meq via ORAL
  Filled 2023-07-08: qty 2

## 2023-07-08 NOTE — Progress Notes (Addendum)
 PROGRESS NOTE    Justin Bird  FMW:[id number] DOB: 1940-07-21 DOA: 06/29/2023 PCP: [name], MD    Brief Narrative:  83 y.o. male with medical history significant of hard of hearing, depression, Parkinson's disease, allergy, who presents with altered mental status.  Assessment & Plan:   Acute metabolic encephalopathy:  Etiology is not clear.  CT head negative for acute intracranial abnormalities.  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   Potential differential diagnosis to include early stage of dementia, delirium B12 and TSH within normal limits.   Patient has possibly missed some dose of Sinemet , which may have contributed partially due to withdrawal.   Patient received IV Ativan , IV Haldol , IM Zyprexa  in ED.  Seen by psychiatry who added Seroquel  3 times a day.  Not a candidate for inpatient psychiatric admission. Previous rounding physicians did not feel that EEG was indicated. Mentation appears to have stabilized in the last few days.  Patient currently on Sinemet  for his Parkinson's.  Also on Lexapro  and trazodone  and Seroquel .  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  This will need to be addressed in the outpatient setting.    Acute urinary retention A Foley catheter is noted.  Placed on 1/8 for urinary retention.  No known history of BPH.    Voiding trial can be attempted when patient is more ambulatory. Flomax was considered however patient has severe allergic reaction to sulfa medications including anaphylaxis.   DVT prophylaxis: SQ Lovenox  Code Status: Full Family Communication: No family at bedside Disposition Plan: SNF is recommended   Consultants:  Psychiatry  Procedures:  None  Antimicrobials: None    Subjective: Very hard of hearing so very difficult to  communicate.  Does not appear to be in any discomfort or distress.  He denies any pain.   Objective: Vitals:   07/07/23 1622 07/07/23 1931 07/08/23 0533 07/08/23 0812  BP: (!) 143/60 137/74 (!) 152/79 (!) 172/83  Pulse: 87 82 85 83  Resp: 18 18 18 16   Temp: 98 F (36.7 C) 98 F (36.7 C) 97.7 F (36.5 C) 98.4 F (36.9 C)  TempSrc:      SpO2: 98% 100% 97% 96%    Intake/Output Summary (Last 24 hours) at 07/08/2023 1114 Last data filed at 07/08/2023 0530 Gross per 24 hour  Intake 200 ml  Output 2150 ml  Net -1950 ml    There were no vitals filed for this visit.  Examination:  General appearance: Awake alert.  In no distress Resp: Clear to auscultation bilaterally.  Normal effort Cardio: S1-S2 is normal regular.  No S3-S4.  No rubs murmurs or bruit GI: Abdomen is soft.  Nontender nondistended.  Bowel sounds are present normal.  No masses organomegaly   Data Reviewed: I have personally reviewed following labs and imaging studies  CBC: Recent Labs  Lab 07/03/23 0954 07/05/23 0508 07/06/23 0424 07/07/23 0955 07/08/23 0856  WBC 7.9 7.5 7.0 6.3 9.0  HGB 15.4 15.1 14.6 16.0 15.5  HCT 46.5 45.3 43.4 47.9 46.1  MCV 89.4 90.1 90.4 90.7 89.3  PLT 287 295 258 263 287   Basic Metabolic Panel: Recent Labs  Lab 07/03/23 0954 07/05/23 0508 07/06/23 0424 07/07/23 0955 07/08/23 0856  NA 141 145 140 141 135  K 3.7 3.4* 3.4* 4.3 3.5  CL 106 107 105 106  102  CO2 19* 26 24 25 24   GLUCOSE 125* 97 106* 90 81  BUN 22 15 11 9 10   CREATININE 0.60* 0.60* 0.63 0.58* 0.60*  CALCIUM 9.0 8.9 8.4* 8.8* 8.6*  MG 2.2 2.2 2.1 2.1  --   PHOS 3.0 3.4 3.1 2.9  --    GFR: CrCl cannot be calculated (Unknown ideal weight.).   Recent Results (from the past 240 hours)  SARS Coronavirus 2 by RT PCR (hospital order, performed in [facility] hospital lab) *cepheid single result test* Anterior Nasal Swab     Status: None   Collection Time: 06/30/23  9:06 AM   Specimen: Anterior Nasal Swab   Result Value Ref Range Status   SARS Coronavirus 2 by RT PCR NEGATIVE NEGATIVE Final    Comment: (NOTE) SARS-CoV-2 target nucleic acids are NOT DETECTED.  The SARS-CoV-2 RNA is generally detectable in upper and lower respiratory specimens during the acute phase of infection. The lowest concentration of SARS-CoV-2 viral copies this assay can detect is 250 copies / mL. A negative result does not preclude SARS-CoV-2 infection and should not be used as the sole basis for treatment or other patient management decisions.  A negative result may occur with improper specimen collection / handling, submission of specimen other than nasopharyngeal swab, presence of viral mutation(s) within the areas targeted by this assay, and inadequate number of viral copies (<250 copies / mL). A negative result must be combined with clinical observations, patient history, and epidemiological information.  Fact Sheet for Patients:   roadlaptop.co.za  Fact Sheet for Healthcare Providers: http://kim-miller.com/  This test is not yet approved or  cleared by the [place]  FDA and has been authorized for detection and/or diagnosis of SARS-CoV-2 by FDA under an Emergency Use Authorization (EUA).  This EUA will remain in effect (meaning this test can be used) for the duration of the COVID-19 declaration under Section 564(b)(1) of the Act, 21 U.S.C. section 360bbb-3(b)(1), unless the authorization is terminated or revoked sooner.  Performed at [facility], [address], [place], [place] [address]     Radiology Studies: No results found.   Scheduled Meds:  bisacodyl   10 mg Oral QHS   carbidopa -levodopa   2 tablet Oral TID   Chlorhexidine  Gluconate Cloth  6 each Topical Daily   cyanocobalamin   1,000 mcg Oral Daily   doxazosin   1 mg Oral Daily   enoxaparin  (LOVENOX ) injection  40 mg Subcutaneous Q24H   escitalopram   5 mg Oral Daily   feeding  supplement  237 mL Oral BID BM   latanoprost   1 drop Both Eyes QHS   polyethylene glycol  17 g Oral BID   QUEtiapine   100 mg Oral TID   traZODone   150 mg Oral QHS   Continuous Infusions:  sodium chloride  75 mL/hr at 07/08/23 0809     LOS: 8 days    Joette Pebbles, MD [facility]   If 7PM-7AM, please contact night-coverage  07/08/2023, 11:14 AM

## 2023-07-08 NOTE — Plan of Care (Signed)
  Problem: Safety: Goal: Non-violent Restraint(s) Outcome: Progressing   Problem: Education: Goal: Knowledge of General Education information will improve Description: Including pain rating scale, medication(s)/side effects and non-pharmacologic comfort measures Outcome: Progressing   Problem: Clinical Measurements: Goal: Diagnostic test results will improve Outcome: Progressing   Problem: Activity: Goal: Risk for activity intolerance will decrease Outcome: Progressing

## 2023-07-09 DIAGNOSIS — G9341 Metabolic encephalopathy: Secondary | ICD-10-CM | POA: Diagnosis not present

## 2023-07-09 DIAGNOSIS — G20A1 Parkinson's disease without dyskinesia, without mention of fluctuations: Secondary | ICD-10-CM | POA: Diagnosis not present

## 2023-07-09 LAB — BASIC METABOLIC PANEL
Anion gap: 15 (ref 5–15)
BUN: 11 mg/dL (ref 8–23)
CO2: 20 mmol/L — ABNORMAL LOW (ref 22–32)
Calcium: 8.8 mg/dL — ABNORMAL LOW (ref 8.9–10.3)
Chloride: 103 mmol/L (ref 98–111)
Creatinine, Ser: 0.61 mg/dL (ref 0.61–1.24)
GFR, Estimated: >60 mL/min (ref >60)
Glucose, Bld: 82 mg/dL (ref 70–99)
Potassium: 3.4 mmol/L — ABNORMAL LOW (ref 3.5–5.1)
Sodium: 138 mmol/L (ref 135–145)

## 2023-07-09 MED ORDER — POTASSIUM CHLORIDE CRYS ER 20 MEQ PO TBCR
40.0000 meq | EXTENDED_RELEASE_TABLET | Freq: Once | ORAL | Status: AC
Start: 2023-07-09 — End: 2023-07-09
  Administered 2023-07-09: 40 meq via ORAL
  Filled 2023-07-09: qty 2

## 2023-07-09 NOTE — Plan of Care (Signed)
  Problem: Safety: Goal: Non-violent Restraint(s) Outcome: Progressing   Problem: Health Behavior/Discharge Planning: Goal: Ability to manage health-related needs will improve Outcome: Progressing   Problem: Activity: Goal: Risk for activity intolerance will decrease Outcome: Progressing   Problem: Elimination: Goal: Will not experience complications related to urinary retention Outcome: Progressing

## 2023-07-09 NOTE — TOC Progression Note (Signed)
 Transition of Care [facility]) - Progression Note    Patient Details  Name: Justin Bird MRN: [id number] Date of Birth: 12-21-1940  Transition [facility] Care [facility]) CM/SW Contact  Odella Ku, RN Phone Number: 07/09/2023, 1:20 PM  Clinical Narrative:     Confirmed with MD that patient will be medically ready for discharge, TOC team will start insurance auth.   Expected Discharge Plan: Skilled Nursing Facility Barriers to Discharge: Continued Medical Work up  Expected Discharge Plan and Services       Living arrangements for the past 2 months: Single Family Home                                       Social Determinants of Health (SDOH) Interventions SDOH Screenings   Food Insecurity: Patient Unable To Answer (07/01/2023)  Housing: Patient Unable To Answer (07/01/2023)  Transportation Needs: No Transportation Needs (07/01/2023)  Utilities: Not At Risk (07/01/2023)  Financial Resource Strain: Patient Declined (01/04/2023)   Received from [facility]  Social Connections: Patient Unable To Answer (07/01/2023)  Tobacco Use: Low Risk  (06/30/2023)    Readmission Risk Interventions     No data to display

## 2023-07-09 NOTE — Progress Notes (Signed)
 PROGRESS NOTE    Justin Bird  FMW:[id number] DOB: 03-22-1941 DOA: 06/29/2023 PCP: [name], MD    Brief Narrative:  83 y.o. male with medical history significant of hard of hearing, depression, Parkinson's disease, allergy, who presents with altered mental status.  Assessment & Plan:   Acute metabolic encephalopathy:  Etiology is not clear.  CT head negative for acute intracranial abnormalities.  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   Potential differential diagnosis to include early stage of dementia, delirium B12 and TSH within normal limits.   Patient has possibly missed some dose of Sinemet , which may have contributed partially due to withdrawal.   Patient received IV Ativan , IV Haldol , IM Zyprexa  in ED.  Seen by psychiatry who added Seroquel  3 times a day.  Not a candidate for inpatient psychiatric admission. Previous rounding physicians did not feel that EEG was indicated. Mentation seems to have stabilized though he still remains confused and agitated at times. Patient remains on Sinemet  for his Parkinson's. Remains on Lexapro  trazodone  and Seroquel  as per psychiatry.  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  This will need to be addressed in the outpatient setting.    Acute urinary retention A Foley catheter is noted.  Placed on 1/8 for urinary retention.  No known history of BPH.    Voiding trial can be attempted when patient is more ambulatory. Flomax was considered however patient has severe allergic reaction to sulfa medications including anaphylaxis.   DVT prophylaxis: SQ Lovenox  Code Status: Full Family Communication: No family at bedside Disposition Plan: SNF is recommended   Consultants:  Psychiatry  Procedures:  None  Antimicrobials: None     Subjective: Patient is very hard of hearing so it is difficult to communicate with him.   Objective: Vitals:   07/08/23 1620 07/08/23 2121 07/09/23 0427 07/09/23 0810  BP: (!) 157/79 (!) 122/93 125/62   Pulse: 91 94 70   Resp: 18  18 18   Temp: 98.4 F (36.9 C) 98.8 F (37.1 C) 97.7 F (36.5 C)   TempSrc:      SpO2: 96%  97%     Intake/Output Summary (Last 24 hours) at 07/09/2023 9077 Last data filed at 07/08/2023 1656 Gross per 24 hour  Intake --  Output 650 ml  Net -650 ml    There were no vitals filed for this visit.  Examination:  General appearance: Awake alert.  In no distress.  Hard of hearing.  Distracted Resp: Clear to auscultation bilaterally.  Normal effort Cardio: S1-S2 is normal regular.  No S3-S4.  No rubs murmurs or bruit GI: Abdomen is soft.  Nontender nondistended.  Bowel sounds are present normal.  No masses organomegaly   Data Reviewed: I have personally reviewed following labs and imaging studies  CBC: Recent Labs  Lab 07/03/23 0954 07/05/23 0508 07/06/23 0424 07/07/23 0955 07/08/23 0856  WBC 7.9 7.5 7.0 6.3 9.0  HGB 15.4 15.1 14.6 16.0 15.5  HCT 46.5 45.3 43.4 47.9 46.1  MCV 89.4 90.1 90.4 90.7 89.3  PLT 287 295 258 263 287   Basic Metabolic Panel: Recent Labs  Lab 07/03/23 0954 07/05/23 0508 07/06/23 0424 07/07/23 0955 07/08/23 0856  NA 141 145 140 141 135  K 3.7 3.4* 3.4* 4.3 3.5  CL 106 107 105 106 102  CO2 19* 26 24  25 24  GLUCOSE 125* 97 106* 90 81  BUN 22 15 11 9 10   CREATININE 0.60* 0.60* 0.63 0.58* 0.60*  CALCIUM 9.0 8.9 8.4* 8.8* 8.6*  MG 2.2 2.2 2.1 2.1  --   PHOS 3.0 3.4 3.1 2.9  --    GFR: CrCl cannot be calculated (Unknown ideal weight.).   Recent Results (from the past 240 hours)  SARS Coronavirus 2 by RT PCR (hospital order, performed in [facility] hospital lab) *cepheid single result test* Anterior Nasal Swab     Status: None   Collection Time: 06/30/23  9:06 AM   Specimen: Anterior Nasal Swab   Result Value Ref Range Status   SARS Coronavirus 2 by RT PCR NEGATIVE NEGATIVE Final    Comment: (NOTE) SARS-CoV-2 target nucleic acids are NOT DETECTED.  The SARS-CoV-2 RNA is generally detectable in upper and lower respiratory specimens during the acute phase of infection. The lowest concentration of SARS-CoV-2 viral copies this assay can detect is 250 copies / mL. A negative result does not preclude SARS-CoV-2 infection and should not be used as the sole basis for treatment or other patient management decisions.  A negative result may occur with improper specimen collection / handling, submission of specimen other than nasopharyngeal swab, presence of viral mutation(s) within the areas targeted by this assay, and inadequate number of viral copies (<250 copies / mL). A negative result must be combined with clinical observations, patient history, and epidemiological information.  Fact Sheet for Patients:   roadlaptop.co.za  Fact Sheet for Healthcare Providers: http://kim-miller.com/  This test is not yet approved or  cleared by the [place]  FDA and has been authorized for detection and/or diagnosis of SARS-CoV-2 by FDA under an Emergency Use Authorization (EUA).  This EUA will remain in effect (meaning this test can be used) for the duration of the COVID-19 declaration under Section 564(b)(1) of the Act, 21 U.S.C. section 360bbb-3(b)(1), unless the authorization is terminated or revoked sooner.  Performed at [facility], [address] [place], [place] [address]     Radiology Studies: No results found.   Scheduled Meds:  bisacodyl   10 mg Oral QHS   carbidopa -levodopa   2 tablet Oral TID   Chlorhexidine  Gluconate Cloth  6 each Topical Daily   cyanocobalamin   1,000 mcg Oral Daily   doxazosin   1 mg Oral Daily   enoxaparin  (LOVENOX ) injection  40 mg Subcutaneous Q24H   escitalopram   5 mg Oral Daily   feeding  supplement  237 mL Oral BID BM   latanoprost   1 drop Both Eyes QHS   polyethylene glycol  17 g Oral BID   QUEtiapine   100 mg Oral TID   traZODone   150 mg Oral QHS   Continuous Infusions:     LOS: 9 days    Joette Pebbles, MD [facility]   If 7PM-7AM, please contact night-coverage  07/09/2023, 9:22 AM

## 2023-07-10 ENCOUNTER — Other Ambulatory Visit: Payer: Self-pay

## 2023-07-10 DIAGNOSIS — G9341 Metabolic encephalopathy: Secondary | ICD-10-CM

## 2023-07-10 NOTE — Progress Notes (Signed)
 Patient refusing all VS, assessment, and medication at this time. This nurse attempted to write patient note to read due to hearing impairment and patient replied I'm not reading that, get the hell out of this house. Mittens remain on patient, tele-sitter in place, bed alarm on.

## 2023-07-10 NOTE — TOC Progression Note (Signed)
 Transition of Care [facility]) - Progression Note    Patient Details  Name: Justin Bird MRN: [id number] Date of Birth: 10-24-1940  [facility]) CM/SW Contact  Ladene Lady, LCSW Phone Number: 07/10/2023, 9:27 AM  Clinical Narrative:   Pt still not medically ready has mitts. CSW following.    Expected Discharge Plan: Skilled Nursing Facility Barriers to Discharge: Continued Medical Work up  Expected Discharge Plan and Services       Living arrangements for the past 2 months: Single Family Home                                       Social Determinants of Health (SDOH) Interventions SDOH Screenings   Food Insecurity: Patient Unable To Answer (07/01/2023)  Housing: Patient Unable To Answer (07/01/2023)  Transportation Needs: No Transportation Needs (07/01/2023)  Utilities: Not At Risk (07/01/2023)  Financial Resource Strain: Patient Declined (01/04/2023)   Received from [facility]  Social Connections: Patient Unable To Answer (07/01/2023)  Tobacco Use: Low Risk  (06/30/2023)    Readmission Risk Interventions     No data to display

## 2023-07-10 NOTE — Consult Note (Signed)
 [facility] Psychiatric Consult Follow-up  Patient Name: .Justin Bird  MRN: [id number]  DOB: [birth year]  Consult Order details:  Orders (From admission, onward)     Start     Ordered   07/04/23 1329  IP CONSULT TO PSYCHIATRY       Ordering Provider: Von Bellis, MD  Provider:  (Not yet assigned)  Question Answer Comment  Location [facility]   Reason for Consult? Agitation, AMS      07/04/23 1328            Mode of Visit: In person   Psychiatry Consult Evaluation  Service Date: July 10, 2023 LOS:  LOS: 10 days  Chief Complaint doesn't ask or anything  Primary Psychiatric Diagnoses  Hx of depression/anxiety  Assessment  Justin Bird is a 83 y.o. male w/ history of hard of hearing, depression, anxiety, parkinsons, who was medically admitted on 06/29/2023 w/ concerns for acute metabolic encephalopathy.   On assessment today, pt continues to be disoriented, is oriented only to self, although presents with brighter affect, and more able to engage in conversation. Denies suicidal, homicidal ideations, auditory visual hallucinations or paranoia. He is wearing mitts on assessment. Spoke w/ staff and pt can be irritable, refuses medications at times, is wearing mitts because of pulling at IV lines. Have reached out to primary team about whether discontinuing IV lines is possible if pt is not using them. Pt appears to be very hard of hearing, which may also be contributing to disorientation and behavioral episodes. Pt has hearing aids which batteries need to be replaced. Discussed this also with primary team.   Diagnoses:  Active Hospital problems: Principal Problem:   Acute metabolic encephalopathy Active Problems:   Parkinson's disease (HCC)   Plan   ## Psychiatric Medication Recommendations:  -continue seroquel  100mg  oral 3 times daily -continue trazodone  150mg  oral daily at bedtime -continue geodon  10mg  IM every 4 hours PRN  agitation -continue lexapro  5mg  oral daily  ## Medical Decision Making Capacity: Not specifically addressed in this encounter  ## Further Work-up:  -- defer further work-up to hospitalist -- repeat EKG ordered  ## Disposition:-- Pt does not meet criteria for inpatient psychiatric admission  ## Behavioral / Environmental: -Delirium Precautions: Delirium Interventions for Nursing and Staff: - RN to open blinds every AM. - To Bedside: Glasses, hearing aide, and pt's own shoes. Make available to patients. when possible and encourage use. - Encourage po fluids when appropriate, keep fluids within reach. - OOB to chair with meals. - Passive ROM exercises to all extremities with AM & PM care. - RN to assess orientation to person, time and place QAM and PRN. - Recommend extended visitation hours with familiar family/friends as feasible. - Staff to minimize disturbances at night. Turn off television when pt asleep or when not in use.   ## Safety and Observation Level:  - Based on my clinical evaluation, I estimate the patient to be at low risk of self harm in the current setting. - At this time, we recommend  routine. This decision is based on my review of the chart including patient's history and current presentation, interview of the patient, mental status examination, and consideration of suicide risk including evaluating suicidal ideation, plan, intent, suicidal or self-harm behaviors, risk factors, and protective factors. This judgment is based on our ability to directly address suicide risk, implement suicide prevention strategies, and develop a safety plan while the patient is in the clinical setting. Please contact our team  if there is a concern that risk level has changed.  CSSR Risk Category:C-SSRS RISK CATEGORY: No Risk  Suicide Risk Assessment: Patient has following non-modifiable or demographic risk factors for suicide: male gender Patient has the following protective factors against  suicide: Supportive family  Thank you for this consult request. Recommendations have been communicated to the primary team.  We will continue to follow at this time.   [name], NP       History of Present Illness  Patient Report:  Pt reports his name is Justin Bird. States he is not sure about the month, year, type of facility, or location. Reports irritation about people coming into this room, waking him up, and getting vitals on him. States today he yelled at the person who came into his room. Denies suicidal, homicidal ideations, auditory visual hallucinations or paranoia.   Psych ROS:  Denies suicidal, homicidal ideations, auditory visual hallucinations or paranoia.  Psychiatric and Social History  Psychiatric History:    Prev Dx/Sx: Per chart review, history of depression/anxiety Previous Med Trials: Per chart review, had been on lexapro  previously Prior Psych Hospitalization: Do not see history of psych hospitalization in chart    Exam Findings  Vital Signs:  Temp:  [97.2 F (36.2 C)] 97.2 F (36.2 C) (01/12 2026) Pulse Rate:  [94] 94 (01/13 0532) Resp:  [18] 18 (01/12 2026) BP: (129-148)/(75-92) 129/92 (01/13 0532) SpO2:  [100 %] 100 % (01/13 0532) Blood pressure (!) 129/92, pulse 94, temperature (!) 97.2 F (36.2 C), resp. rate 18, SpO2 100%. There is no height or weight on file to calculate BMI.  Physical Exam Constitutional:      General: He is not in acute distress.    Appearance: He is not ill-appearing, toxic-appearing or diaphoretic.  Pulmonary:     Effort: No respiratory distress.  Neurological:     Mental Status: He is alert. He is disoriented.  Psychiatric:        Attention and Perception: Perception normal.        Behavior: Behavior is cooperative.        Thought Content: Thought content is not paranoid or delusional. Thought content does not include homicidal or suicidal ideation.    Mental Status Exam: General Appearance: Disheveled   Orientation:  Other:  disoriented  Memory:  Immediate;   limited  Concentration:  Concentration: limited and Attention Span: limited  Recall:  Poor  Attention  Other: limited  Eye Contact:  Minimal  Speech:  improved since last assessment  Language:  improved since last assessment  Volume:  Increased  Mood: ok  Affect:  bright affect  Thought Process:  improved since last assessment  Thought Content:  improved since last assessment  Suicidal Thoughts:  No  Homicidal Thoughts:  No  Judgement:  Impaired  Insight:  limited  Psychomotor Activity:  lying down on assessment  Akathisia:  No  Fund of Knowledge:  limited    Assets:  Presenter, Broadcasting Social Support  Cognition:  impaired         Other History   These have been pulled in through the EMR, reviewed, and updated if appropriate.  Family History:  The patient's family history includes Lung cancer in his mother.  Medical History: Past Medical History:  Diagnosis Date  . Allergy   . Hearing deficit   . Parkinson's disease [facility])    Surgical History: Past Surgical History:  Procedure Laterality Date  . COCHLEAR IMPLANT Left   . PARTIAL COLECTOMY  Medications:   Current Facility-Administered Medications:  .  acetaminophen  (TYLENOL ) tablet 650 mg, 650 mg, Oral, Q6H PRN, Niu, Xilin, MD, 650 mg at 07/08/23 2134 .  [COMPLETED] bisacodyl  (DULCOLAX) EC tablet 10 mg, 10 mg, Oral, BID, 10 mg at 07/06/23 2122 **FOLLOWED BY** bisacodyl  (DULCOLAX) EC tablet 10 mg, 10 mg, Oral, QHS, Von Bellis, MD, 10 mg at 07/09/23 2101 .  bisacodyl  (DULCOLAX) suppository 10 mg, 10 mg, Rectal, Daily PRN, Von Bellis, MD .  carbidopa -levodopa  (SINEMET  IR) 25-100 MG per tablet immediate release 2 tablet, 2 tablet, Oral, TID, Floy Roberts, MD, 2 tablet at 07/09/23 2101 .  Chlorhexidine  Gluconate Cloth 2 % PADS 6 each, 6 each, Topical, Daily, Von Bellis, MD, 6 each at 07/09/23 1047 .  cyanocobalamin  (VITAMIN B12)  tablet 1,000 mcg, 1,000 mcg, Oral, Daily, Niu, Xilin, MD, 1,000 mcg at 07/09/23 1238 .  doxazosin  (CARDURA ) tablet 1 mg, 1 mg, Oral, Daily, Von Bellis, MD, 1 mg at 07/09/23 1238 .  enoxaparin  (LOVENOX ) injection 40 mg, 40 mg, Subcutaneous, Q24H, Niu, Xilin, MD, 40 mg at 07/09/23 2102 .  escitalopram  (LEXAPRO ) tablet 5 mg, 5 mg, Oral, Daily, Tameika Heckmann Eun, NP, 5 mg at 07/09/23 1238 .  feeding supplement (ENSURE ENLIVE / ENSURE PLUS) liquid 237 mL, 237 mL, Oral, BID BM, Von Bellis, MD, 237 mL at 07/08/23 1551 .  hydrALAZINE  (APRESOLINE ) injection 5 mg, 5 mg, Intravenous, Q2H PRN, Niu, Xilin, MD .  latanoprost  (XALATAN ) 0.005 % ophthalmic solution 1 drop, 1 drop, Both Eyes, QHS, Niu, Xilin, MD, 1 drop at 07/09/23 2102 .  ondansetron  (ZOFRAN ) injection 4 mg, 4 mg, Intravenous, Q8H PRN, Niu, Xilin, MD .  polyethylene glycol (MIRALAX  / GLYCOLAX ) packet 17 g, 17 g, Oral, BID, Von Bellis, MD, 17 g at 07/09/23 2102 .  polyvinyl alcohol  (LIQUIFILM TEARS) 1.4 % ophthalmic solution 2 drop, 2 drop, Both Eyes, PRN, Sreenath, Sudheer B, MD .  QUEtiapine  (SEROQUEL ) tablet 100 mg, 100 mg, Oral, TID, Von Bellis, MD, 100 mg at 07/09/23 2101 .  traZODone  (DESYREL ) tablet 150 mg, 150 mg, Oral, QHS, Sreenath, Sudheer B, MD, 150 mg at 07/09/23 2101 .  ziprasidone  (GEODON ) injection 10 mg, 10 mg, Intramuscular, Q4H PRN, Jhonny Sahara B, MD, 10 mg at 07/09/23 9192  Allergies: Allergies  Allergen Reactions  . Opium Poppy [Papaver] Anaphylaxis and Hives  . Penicillins Anaphylaxis and Rash  . Sulfa Antibiotics Anaphylaxis  . Penicillins   . Sulfa Antibiotics    Sha Burling Eun Jameica Couts, NP

## 2023-07-10 NOTE — Progress Notes (Signed)
  Progress Note   Patient: Justin Bird FMW:[id number] DOB: [birth year] DOA: 06/29/2023     10 DOS: the patient was seen and examined on 07/10/2023    Brief Narrative:  83 y.o. male with medical history significant of hard of hearing, depression, Parkinson's disease, allergy, who presents with altered mental status.   Assessment & Plan:   Acute metabolic encephalopathy:  Etiology is not clear.  CT head negative for acute intracranial abnormalities.  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   Potential differential diagnosis to include early stage of dementia, delirium B12 and TSH within normal limits.   Patient has possibly missed some dose of Sinemet , which may have contributed partially due to withdrawal.   Patient received IV Ativan , IV Haldol , IM Zyprexa  in ED.  Seen by psychiatry who added Seroquel  3 times a day.  Not a candidate for inpatient psychiatric admission. Previous rounding physicians did not feel that EEG was indicated. Patient remains agitated and confused at times however improved Patient remains on Sinemet  for his Parkinson's. Remains on Lexapro  trazodone  and Seroquel  as per psychiatry. 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  This will need to be addressed in the outpatient setting.     Acute urinary retention A Foley catheter is noted.  Placed on 1/8 for urinary retention.  No known history of BPH.    Voiding trial can be attempted when patient is more ambulatory. Flomax was considered however patient has severe allergic reaction to sulfa medications including anaphylaxis. Continue to monitor closely    DVT prophylaxis: SQ Lovenox  Code Status: Full Family Communication: No family at bedside Disposition Plan: SNF is  recommended     Consultants:  Psychiatry   Procedures:  None   Antimicrobials: None      Subjective: Patient seen and examined at bedside this morning Patient still mittens Agitation improving Denies nausea vomiting chest pain or cough   Physical Exam: General appearance: Awake alert.  In no distress.  Hard of hearing.  Distracted Resp: Clear to auscultation bilaterally.  Normal effort Cardio: S1-S2 is normal regular.  No S3-S4.  No rubs murmurs or bruit GI: Abdomen is soft.  Nontender nondistended.  Bowel sounds are present normal.  No masses organomegaly    Vitals:   07/09/23 0810 07/09/23 2026 07/10/23 0532 07/10/23 1518  BP:  (!) 148/75 (!) 129/92 122/76  Pulse:  94 94 98  Resp: 18 18  17   Temp:  (!) 97.2 F (36.2 C)  98.3 F (36.8 C)  TempSrc:      SpO2:  100% 100% 93%     Time spent: 38 minutes  Author: Drue ONEIDA Potter, MD 07/10/2023 3:31 PM  For on call review [email or web address].

## 2023-07-10 NOTE — Progress Notes (Signed)
 Physical Therapy Treatment Patient Details Name: Justin Bird MRN: [id number] DOB: 06-27-1941 Today's Date: 07/10/2023   History of Present Illness Pt is an 83 y/o M admitted on 06/29/23 after presenting with AMS. Head CT negative for acute issues (unable to obtain MRI 2/2 cochlear implant). PMH: HOH with cochlear implant, depression, Parkinson's disease    PT Comments  Pt seen for PT tx with pt received in bed, nurse in room. Attempted to communicate with pt via writing but pt reports he wears glasses for reading & PT unable to locate them in room. Pt willing to eat applesauce, feeds himself. PT encouraged pt to sit EOB but pt declines, pt is able to transition to long sitting from semi fowler with min assist, lift BLE against gravity. Will continue PT efforts.    If plan is discharge home, recommend the following: Two people to help with walking and/or transfers;Two people to help with bathing/dressing/bathroom;Direct supervision/assist for medications management;Help with stairs or ramp for entrance;Assist for transportation;Assistance with feeding;Direct supervision/assist for financial management;Assistance with cooking/housework;Supervision due to cognitive status   Can travel by private vehicle     No  Equipment Recommendations  None recommended by PT    Recommendations for Other Services       Precautions / Restrictions Precautions Precautions: Fall Precaution Comments: L cochlear implant, decreased vision Restrictions Weight Bearing Restrictions Per Provider Order: No     Mobility  Bed Mobility               General bed mobility comments: Pt able to transition from semi fowler to long sitting with min assist, pt attempts to scoot back towards [facility]    Transfers                        Ambulation/Gait                   Stairs             Wheelchair Mobility     Tilt Bed    Modified Rankin (Stroke Patients Only)       Balance                                             Cognition Arousal: Alert Behavior During Therapy: Restless Overall Cognitive Status: No family/caregiver present to determine baseline cognitive functioning                                 General Comments: Pt with ongoing conversation throughout session, has difficulty reading (notes he wears glasses for reading at baseline but unable to locate any in room). Pt internally distracted by various thinkgs (believes he lost his job 2/2 being in hospital), unable to identify/recognize applesauce in front of him        Exercises Other Exercises Other Exercises: Pt able to lift BLE against gravity in bed.    General Comments        Pertinent Vitals/Pain Pain Assessment Pain Assessment: Faces Faces Pain Scale: No hurt    Home Living                          Prior Function            PT Goals (current goals  can now be found in the care plan section) Acute Rehab PT Goals PT Goal Formulation: Patient unable to participate in goal setting Time For Goal Achievement: 07/14/23 Potential to Achieve Goals: Poor Progress towards PT goals: Progressing toward goals    Frequency    Min 1X/week      PT Plan      Co-evaluation              AM-PAC PT 6 Clicks Mobility   Outcome Measure  Help needed turning from your back to your side while in a flat bed without using bedrails?: A Lot Help needed moving from lying on your back to sitting on the side of a flat bed without using bedrails?: A Lot Help needed moving to and from a bed to a chair (including a wheelchair)?: Total Help needed standing up from a chair using your arms (e.g., wheelchair or bedside chair)?: A Lot Help needed to walk in hospital room?: Total Help needed climbing 3-5 steps with a railing? : Total 6 Click Score: 9    End of Session   Activity Tolerance:  (limited 2/2 cognition) Patient left: in bed;with call  bell/phone within reach;with bed alarm set (BUE mittens donned, tele sitter in room)   PT Visit Diagnosis: Muscle weakness (generalized) (M62.81);Other abnormalities of gait and mobility (R26.89);Difficulty in walking, not elsewhere classified (R26.2)     Time: 8691-8674 PT Time Calculation (min) (ACUTE ONLY): 17 min  Charges:    $Therapeutic Activity: 8-22 mins PT General Charges $$ ACUTE PT VISIT: 1 Visit                     Richerd Pinal, PT, DPT 07/10/23, 1:32 PM    Richerd CHRISTELLA Pinal 07/10/2023, 1:30 PM

## 2023-07-10 NOTE — Plan of Care (Signed)
  Problem: Education: Goal: Knowledge of General Education information will improve Description: Including pain rating scale, medication(s)/side effects and non-pharmacologic comfort measures Outcome: Not Progressing  --------------------------------------------------------------------------------------  Problem: Safety: Goal: Non-violent Restraint(s) Outcome: Progressing   Problem: Health Behavior/Discharge Planning: Goal: Ability to manage health-related needs will improve Outcome: Progressing   Problem: Clinical Measurements: Goal: Ability to maintain clinical measurements within normal limits will improve Outcome: Progressing Goal: Will remain free from infection Outcome: Progressing Goal: Diagnostic test results will improve Outcome: Progressing Goal: Respiratory complications will improve Outcome: Progressing Goal: Cardiovascular complication will be avoided Outcome: Progressing   Problem: Activity: Goal: Risk for activity intolerance will decrease Outcome: Progressing   Problem: Nutrition: Goal: Adequate nutrition will be maintained Outcome: Progressing   Problem: Coping: Goal: Level of anxiety will decrease Outcome: Progressing   Problem: Elimination: Goal: Will not experience complications related to bowel motility Outcome: Progressing Goal: Will not experience complications related to urinary retention Outcome: Progressing   Problem: Pain Management: Goal: General experience of comfort will improve Outcome: Progressing   Problem: Safety: Goal: Ability to remain free from injury will improve Outcome: Progressing   Problem: Skin Integrity: Goal: Risk for impaired skin integrity will decrease Outcome: Progressing

## 2023-07-10 NOTE — Progress Notes (Signed)
 IV removed per order and mitts removed. Patient cooperative and following commands at this time. Foley remains in place.

## 2023-07-11 DIAGNOSIS — G9341 Metabolic encephalopathy: Secondary | ICD-10-CM | POA: Diagnosis not present

## 2023-07-11 DIAGNOSIS — F05 Delirium due to known physiological condition: Secondary | ICD-10-CM | POA: Diagnosis not present

## 2023-07-11 LAB — CBC WITH DIFFERENTIAL/PLATELET
Abs Immature Granulocytes: 0.01 10*3/uL (ref 0.00–0.07)
Basophils Absolute: 0 10*3/uL (ref 0.0–0.1)
Basophils Relative: 1 %
Eosinophils Absolute: 0.2 10*3/uL (ref 0.0–0.5)
Eosinophils Relative: 3 %
HCT: 46.9 % (ref 39.0–52.0)
Hemoglobin: 15.9 g/dL (ref 13.0–17.0)
Immature Granulocytes: 0 %
Lymphocytes Relative: 23 %
Lymphs Abs: 1.7 10*3/uL (ref 0.7–4.0)
MCH: 30.3 pg (ref 26.0–34.0)
MCHC: 33.9 g/dL (ref 30.0–36.0)
MCV: 89.3 fL (ref 80.0–100.0)
Monocytes Absolute: 0.7 10*3/uL (ref 0.1–1.0)
Monocytes Relative: 9 %
Neutro Abs: 4.6 10*3/uL (ref 1.7–7.7)
Neutrophils Relative %: 64 %
Platelets: 306 10*3/uL (ref 150–400)
RBC: 5.25 MIL/uL (ref 4.22–5.81)
RDW: 12.7 % (ref 11.5–15.5)
WBC: 7.2 10*3/uL (ref 4.0–10.5)
nRBC: 0 % (ref 0.0–0.2)

## 2023-07-11 LAB — BASIC METABOLIC PANEL
Anion gap: 11 (ref 5–15)
BUN: 18 mg/dL (ref 8–23)
CO2: 26 mmol/L (ref 22–32)
Calcium: 9 mg/dL (ref 8.9–10.3)
Chloride: 105 mmol/L (ref 98–111)
Creatinine, Ser: 0.6 mg/dL — ABNORMAL LOW (ref 0.61–1.24)
GFR, Estimated: >60 mL/min (ref >60)
Glucose, Bld: 92 mg/dL (ref 70–99)
Potassium: 4 mmol/L (ref 3.5–5.1)
Sodium: 142 mmol/L (ref 135–145)

## 2023-07-11 NOTE — Progress Notes (Signed)
 Progress Note   Patient: Justin Bird FMW:[id number] DOB: 1940/12/19 DOA: 06/29/2023     11 DOS: the patient was seen and examined on 07/11/2023     Brief Narrative:  83 y.o. male with medical history significant of hard of hearing, depression, Parkinson's disease, allergy, who presents with altered mental status.   Assessment & Plan:   Acute metabolic encephalopathy:  Etiology is not clear.  CT head negative for acute intracranial abnormalities.  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   Potential differential diagnosis to include early stage of dementia, delirium B12 and TSH within normal limits.   Patient has possibly missed some dose of Sinemet , which may have contributed partially due to withdrawal.   Patient received IV Ativan , IV Haldol , IM Zyprexa  in ED.  Seen by psychiatry who added Seroquel  3 times a day.  Not a candidate for inpatient psychiatric admission. Previous rounding physicians did not feel that EEG was indicated. Patient remains agitated and confused at times however improved Patient remains on Sinemet  for his Parkinson's. Remains on Lexapro  trazodone  and Seroquel  as per psychiatry. 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  This will need to be addressed in the outpatient setting.     Acute urinary retention A Foley catheter is noted.  Placed on 1/8 for urinary retention.  No known history of BPH.    Voiding trial can be attempted when patient is more ambulatory. Flomax was considered however patient has severe allergic reaction to sulfa medications including anaphylaxis. Continue to monitor     DVT prophylaxis: SQ Lovenox  Code Status: Full Family Communication: No family at  bedside Disposition Plan: SNF is recommended     Consultants:  Psychiatry   Procedures:  None   Antimicrobials: None      Subjective: Patient seen and examined at bedside this morning denies nausea vomiting abdominal pain Mental status slightly better today compared to yesterday     Physical Exam: General appearance: Awake alert.  In no distress.  Hard of hearing.  Distracted Resp: Clear to auscultation bilaterally.  Normal effort Cardio: S1-S2 is normal regular.  No S3-S4.  No rubs murmurs or bruit GI: Abdomen is soft.  Nontender nondistended.  Bowel sounds are present normal.  No masses organomegaly    Data Reviewed:    Latest Ref Rng & Units 07/11/2023    6:10 AM 07/08/2023    8:56 AM 07/07/2023    9:55 AM  CBC  WBC 4.0 - 10.5 K/uL 7.2  9.0  6.3   Hemoglobin 13.0 - 17.0 g/dL 84.0  84.4  83.9   Hematocrit 39.0 - 52.0 % 46.9  46.1  47.9   Platelets 150 - 400 K/uL 306  287  263        Latest Ref Rng & Units 07/11/2023    6:10 AM 07/09/2023    8:51 AM 07/08/2023    8:56 AM  BMP  Glucose 70 - 99 mg/dL 92  82  81   BUN 8 - 23 mg/dL 18  11  10    Creatinine 0.61 - 1.24 mg/dL 9.39  9.38  9.39   Sodium 135 - 145 mmol/L 142  138  135   Potassium 3.5 - 5.1 mmol/L 4.0  3.4  3.5   Chloride 98 - 111 mmol/L 105  103  102   CO2 22 - 32 mmol/L 26  20  24    Calcium 8.9 - 10.3 mg/dL 9.0  8.8  8.6      Vitals:   07/10/23 1518 07/10/23 1951 07/11/23 0430 07/11/23 0743  BP: 122/76 (!) 147/88 (!) 161/61 (!) 141/77  Pulse: 98 94 84 79  Resp: 17   19  Temp: 98.3 F (36.8 C) 98.4 F (36.9 C) (!) 97.5 F (36.4 C)   TempSrc:   Oral   SpO2: 93% 100% 100% 97%     {  Author: Drue ONEIDA Potter, MD 07/11/2023 3:02 PM  For on call review [email or web address].

## 2023-07-11 NOTE — Consult Note (Signed)
 [facility] Face-to-Face Psychiatry Consult - Follow up  Reason for Consult:  Confusion/agitation Referring Physician:  Dr.Dileep Von Patient Identification: Justin Bird MRN:  [id number] Principal Diagnosis: Acute metabolic encephalopathy Diagnosis:  Principal Problem:   Acute metabolic encephalopathy Active Problems:   Parkinson's disease (HCC)   Justin Bird is a 83 y.o. male w/ history of hard of hearing, depression, anxiety, parkinsons, who was medically admitted on 06/29/2023 w/ concerns for acute metabolic encephalopathy.  Subjective:    Today on interview patient is noted to be resting in bed and his mittens were on both hands.  Nurse was in the room administering his medications.  Patient is hard of hearing and his cochlear implants are not functioning.  Provider wrote the questions on the paper and brought it close to the patient.  Patient is very confused and is unable to read any questions on the paper.  He is noted to be picking on the mittens and on the blanket.  No agitation noted he was pleasant and smiling.  When provider gave him thumbs up he smiled.  Per nursing patient has been calm with no agitation but continues to have restlessness.  Recommended to continue Seroquel  100 mg 3 times daily.   Past Psychiatric History:   Prev Dx/Sx: Per chart review, history of depression/anxiety Previous Med Trials: Per chart review, had been on lexapro  previously Prior Psych Hospitalization: Do not see history of psych hospitalization in chart   Past Medical History:  Past Medical History:  Diagnosis Date   Allergy    Hearing deficit    Parkinson's disease (HCC)     Past Surgical History:  Procedure Laterality Date   COCHLEAR IMPLANT Left    PARTIAL COLECTOMY     Family History:  Family History  Problem Relation Age of Onset   Lung cancer Mother    Family Psychiatric  History: see above Social History:  Social History   Substance and Sexual Activity  Alcohol  Use  Not Currently     Social History   Substance and Sexual Activity  Drug Use Never    Social History   Socioeconomic History   Marital status: Married    Spouse name: Not on file   Number of children: Not on file   Years of education: Not on file   Highest education level: Not on file  Occupational History   Not on file  Tobacco Use   Smoking status: Never   Smokeless tobacco: Never  Vaping Use   Vaping status: Never Used  Substance and Sexual Activity   Alcohol  use: Not Currently   Drug use: Never   Sexual activity: Not on file  Other Topics Concern   Not on file  Social History Narrative   ** Merged History Encounter **       Social Drivers of Health   Financial Resource Strain: Patient Declined (01/04/2023)   Received from [facility]   Overall Financial Resource Strain (CARDIA)    Difficulty of Paying Living Expenses: Patient declined  Food Insecurity: Patient Unable To Answer (07/01/2023)   Hunger Vital Sign    Worried About Running Out of Food in the Last Year: Patient unable to answer    Ran Out of Food in the Last Year: Patient unable to answer  Transportation Needs: No Transportation Needs (07/01/2023)   PRAPARE - Administrator, Civil Service (Medical): No    Lack of Transportation (Non-Medical): No  Physical Activity: Not on file  Stress: Not on  file  Social Connections: Patient Unable To Answer (07/01/2023)   Social Connection and Isolation Panel [NHANES]    Frequency of Communication with Friends and Family: Patient unable to answer    Frequency of Social Gatherings with Friends and Family: Patient unable to answer    Attends Religious Services: Patient unable to answer    Active Member of Clubs or Organizations: Patient unable to answer    Attends Banker Meetings: Patient unable to answer    Marital Status: Patient unable to answer   Additional Social History:    Allergies:   Allergies  Allergen Reactions    Opium Poppy [Papaver] Anaphylaxis and Hives   Penicillins Anaphylaxis and Rash   Sulfa Antibiotics Anaphylaxis   Penicillins    Sulfa Antibiotics     Labs:  Results for orders placed or performed during the hospital encounter of 06/29/23 (from the past 48 hours)  CBC with Differential/Platelet     Status: None   Collection Time: 07/11/23  6:10 AM  Result Value Ref Range   WBC 7.2 4.0 - 10.5 K/uL   RBC 5.25 4.22 - 5.81 MIL/uL   Hemoglobin 15.9 13.0 - 17.0 g/dL   HCT 53.0 60.9 - 47.9 %   MCV 89.3 80.0 - 100.0 fL   MCH 30.3 26.0 - 34.0 pg   MCHC 33.9 30.0 - 36.0 g/dL   RDW 87.2 88.4 - 84.4 %   Platelets 306 150 - 400 K/uL   nRBC 0.0 0.0 - 0.2 %   Neutrophils Relative % 64 %   Neutro Abs 4.6 1.7 - 7.7 K/uL   Lymphocytes Relative 23 %   Lymphs Abs 1.7 0.7 - 4.0 K/uL   Monocytes Relative 9 %   Monocytes Absolute 0.7 0.1 - 1.0 K/uL   Eosinophils Relative 3 %   Eosinophils Absolute 0.2 0.0 - 0.5 K/uL   Basophils Relative 1 %   Basophils Absolute 0.0 0.0 - 0.1 K/uL   Immature Granulocytes 0 %   Abs Immature Granulocytes 0.01 0.00 - 0.07 K/uL    Comment: Performed at [facility], [address] [place], [place] [address]  Basic metabolic panel     Status: Abnormal   Collection Time: 07/11/23  6:10 AM  Result Value Ref Range   Sodium 142 135 - 145 mmol/L   Potassium 4.0 3.5 - 5.1 mmol/L   Chloride 105 98 - 111 mmol/L   CO2 26 22 - 32 mmol/L   Glucose, Bld 92 70 - 99 mg/dL    Comment: Glucose reference range applies only to samples taken after fasting for at least 8 hours.   BUN 18 8 - 23 mg/dL   Creatinine, Ser 9.39 (L) 0.61 - 1.24 mg/dL   Calcium 9.0 8.9 - 89.6 mg/dL   GFR, Estimated >39 >39 mL/min    Comment: (NOTE) Calculated using the CKD-EPI Creatinine Equation (2021)    Anion gap 11 5 - 15    Comment: Performed at [facility], [address] [place], [place] [address]    Current Facility-Administered Medications  Medication Dose Route  Frequency Provider Last Rate Last Admin   acetaminophen  (TYLENOL ) tablet 650 mg  650 mg Oral Q6H PRN Niu, Xilin, MD   650 mg at 07/08/23 2134   bisacodyl  (DULCOLAX) EC tablet 10 mg  10 mg Oral QHS Von Bellis, MD   10 mg at 07/10/23 2211   bisacodyl  (DULCOLAX) suppository 10 mg  10 mg Rectal Daily PRN Von Bellis, MD  carbidopa -levodopa  (SINEMET  IR) 25-100 MG per tablet immediate release 2 tablet  2 tablet Oral TID Floy Roberts, MD   2 tablet at 07/11/23 1132   Chlorhexidine  Gluconate Cloth 2 % PADS 6 each  6 each Topical Daily Von Bellis, MD   6 each at 07/11/23 1000   cyanocobalamin  (VITAMIN B12) tablet 1,000 mcg  1,000 mcg Oral Daily Niu, Xilin, MD   1,000 mcg at 07/11/23 1132   doxazosin  (CARDURA ) tablet 1 mg  1 mg Oral Daily Von Bellis, MD   1 mg at 07/11/23 1131   enoxaparin  (LOVENOX ) injection 40 mg  40 mg Subcutaneous Q24H Niu, Xilin, MD   40 mg at 07/10/23 2211   escitalopram  (LEXAPRO ) tablet 5 mg  5 mg Oral Daily Lee, Jacqueline Eun, NP   5 mg at 07/11/23 1132   feeding supplement (ENSURE ENLIVE / ENSURE PLUS) liquid 237 mL  237 mL Oral BID BM Von Bellis, MD   Given at 07/11/23 1100   hydrALAZINE  (APRESOLINE ) injection 5 mg  5 mg Intravenous Q2H PRN Niu, Xilin, MD       latanoprost  (XALATAN ) 0.005 % ophthalmic solution 1 drop  1 drop Both Eyes QHS Niu, Xilin, MD   1 drop at 07/10/23 2212   ondansetron  (ZOFRAN ) injection 4 mg  4 mg Intravenous Q8H PRN Niu, Xilin, MD       polyethylene glycol (MIRALAX  / GLYCOLAX ) packet 17 g  17 g Oral BID Von Bellis, MD   17 g at 07/11/23 1129   polyvinyl alcohol  (LIQUIFILM TEARS) 1.4 % ophthalmic solution 2 drop  2 drop Both Eyes PRN Sreenath, Sudheer B, MD       QUEtiapine  (SEROQUEL ) tablet 100 mg  100 mg Oral TID Von Bellis, MD   100 mg at 07/11/23 1131   traZODone  (DESYREL ) tablet 150 mg  150 mg Oral QHS Sreenath, Sudheer B, MD   150 mg at 07/10/23 2211   ziprasidone  (GEODON ) injection 10 mg  10 mg Intramuscular Q4H PRN  Jhonny Sahara B, MD   10 mg at 07/09/23 0807    Musculoskeletal: Strength & Muscle Tone: decreased Gait & Station: unsteady  Psychiatric Specialty Exam: Patient is hard of hearing and his cochlear implant is not functioning.  Patient is unable to read any interview questions or instructions due to his confusion.  Presentation  General Appearance: Stated age, fairly groomed Eye Contact: Intermittent eye contact Speech: Minimal Speech Volume: Soft   Mood and Affect  Mood: Unable to respond Affect: Euthymic  Thought Process  Thought Processes: Unable to assess as patient is confused Descriptions of Associations: Unable to assess as patient is confused Orientation: Not oriented to self or situation Thought Content: Impoverished Hallucinations: responding to stimuli Ideas of Reference: Unable to assess as patient is confused Suicidal Thoughts: Unable to assess as patient is confused Homicidal Thoughts: Unable to assess as patient is confused  Sensorium unable to assess as patient is confused Memory: Judgment: Insight:  Art Therapist  Concentration: Poor Attention Span: Poor Recall: Fund of Knowledge: Language:  Psychomotor Activity  Psychomotor Activity: Restlessness  Assets  Assets: Unable to assess as patient is confused   Physical Exam: Reviewed and agree with the findings of the medical provider Physical Exam ROS Blood pressure (!) 141/77, pulse 79, temperature (!) 97.5 F (36.4 C), temperature source Oral, resp. rate 19, SpO2 97%. There is no height or weight on file to calculate BMI.  Treatment Plan Summary:  Psychiatric Medication Recommendations:  -continue seroquel  100mg  oral  3 times daily -continue trazodone  150mg  oral daily at bedtime -continue geodon  10mg  IM every 4 hours PRN agitation -continue lexapro  5mg  oral daily   ## Medical Decision Making Capacity: Not specifically addressed in this encounter   ## Further Work-up:  -- defer  further work-up to hospitalist -- repeat EKG ordered   ## Disposition:-- Pt does not meet criteria for inpatient psychiatric admission   ## Behavioral / Environmental: -Delirium Precautions: Delirium Interventions for Nursing and Staff: - RN to open blinds every AM. - To Bedside: Glasses, hearing aide, and pt's own shoes. Make available to patients. when possible and encourage use. - Encourage po fluids when appropriate, keep fluids within reach. - OOB to chair with meals. - Passive ROM exercises to all extremities with AM & PM care. - RN to assess orientation to person, time and place QAM and PRN. - Recommend extended visitation hours with familiar family/friends as feasible. - Staff to minimize disturbances at night. Turn off television when pt asleep or when not in use.  Disposition: No evidence of imminent risk to self or others at present.   Patient does not meet criteria for psychiatric inpatient admission.  Amariyon Maynes, MD 07/11/2023 2:35 PM

## 2023-07-11 NOTE — Plan of Care (Signed)
 Unable to communicate effectively with pt because of pt's hearing issues.  This RN could not educate nor ease pt's anxiety.  Problem: Education: Goal: Knowledge of General Education information will improve Description: Including pain rating scale, medication(s)/side effects and non-pharmacologic comfort measures Outcome: Not Progressing   Problem: Coping: Goal: Level of anxiety will decrease Outcome: Not Progressing ----------------------------------------------------------------------------------  Problem: Safety: Goal: Non-violent Restraint(s) Outcome: Progressing   Problem: Health Behavior/Discharge Planning: Goal: Ability to manage health-related needs will improve Outcome: Progressing   Problem: Clinical Measurements: Goal: Ability to maintain clinical measurements within normal limits will improve Outcome: Progressing Goal: Will remain free from infection Outcome: Progressing Goal: Diagnostic test results will improve Outcome: Progressing Goal: Respiratory complications will improve Outcome: Progressing Goal: Cardiovascular complication will be avoided Outcome: Progressing   Problem: Activity: Goal: Risk for activity intolerance will decrease Outcome: Progressing   Problem: Nutrition: Goal: Adequate nutrition will be maintained Outcome: Progressing   Problem: Elimination: Goal: Will not experience complications related to bowel motility Outcome: Progressing Goal: Will not experience complications related to urinary retention Outcome: Progressing   Problem: Pain Management: Goal: General experience of comfort will improve Outcome: Progressing   Problem: Safety: Goal: Ability to remain free from injury will improve Outcome: Progressing   Problem: Skin Integrity: Goal: Risk for impaired skin integrity will decrease Outcome: Progressing

## 2023-07-12 DIAGNOSIS — G9341 Metabolic encephalopathy: Secondary | ICD-10-CM | POA: Diagnosis not present

## 2023-07-12 LAB — BASIC METABOLIC PANEL
Anion gap: 11 (ref 5–15)
BUN: 20 mg/dL (ref 8–23)
CO2: 26 mmol/L (ref 22–32)
Calcium: 9.2 mg/dL (ref 8.9–10.3)
Chloride: 103 mmol/L (ref 98–111)
Creatinine, Ser: 0.65 mg/dL (ref 0.61–1.24)
GFR, Estimated: >60 mL/min (ref >60)
Glucose, Bld: 105 mg/dL — ABNORMAL HIGH (ref 70–99)
Potassium: 3.5 mmol/L (ref 3.5–5.1)
Sodium: 140 mmol/L (ref 135–145)

## 2023-07-12 LAB — CBC WITH DIFFERENTIAL/PLATELET
Abs Immature Granulocytes: 0.02 10*3/uL (ref 0.00–0.07)
Basophils Absolute: 0 10*3/uL (ref 0.0–0.1)
Basophils Relative: 1 %
Eosinophils Absolute: 0.2 10*3/uL (ref 0.0–0.5)
Eosinophils Relative: 3 %
HCT: 47.9 % (ref 39.0–52.0)
Hemoglobin: 16.1 g/dL (ref 13.0–17.0)
Immature Granulocytes: 0 %
Lymphocytes Relative: 28 %
Lymphs Abs: 2 10*3/uL (ref 0.7–4.0)
MCH: 29.8 pg (ref 26.0–34.0)
MCHC: 33.6 g/dL (ref 30.0–36.0)
MCV: 88.5 fL (ref 80.0–100.0)
Monocytes Absolute: 0.7 10*3/uL (ref 0.1–1.0)
Monocytes Relative: 10 %
Neutro Abs: 4 10*3/uL (ref 1.7–7.7)
Neutrophils Relative %: 58 %
Platelets: 328 10*3/uL (ref 150–400)
RBC: 5.41 MIL/uL (ref 4.22–5.81)
RDW: 12.8 % (ref 11.5–15.5)
WBC: 7 10*3/uL (ref 4.0–10.5)
nRBC: 0 % (ref 0.0–0.2)

## 2023-07-12 NOTE — Progress Notes (Signed)
 Progress Note   Patient: Justin Bird UJW:[id number] DOB: December 14, 1940 DOA: 06/29/2023     12 DOS: the patient was seen and examined on 07/12/2023     Brief Narrative:  83 y.o. male with medical history significant of hard of hearing, depression, Parkinson's disease, allergy, who presents with altered mental status.   Assessment & Plan:   Acute metabolic encephalopathy:  Etiology is not clear.  CT head negative for acute intracranial abnormalities.  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   Potential differential diagnosis to include early stage of dementia, delirium B12 and TSH within normal limits.   Patient has possibly missed some dose of Sinemet , which may have contributed partially due to withdrawal.   Patient received IV Ativan , IV Haldol , IM Zyprexa  in ED.  Still not a candidate for inpatient psych admission according to psych team Patient remains on Sinemet  for his Parkinson's. Remains on Lexapro  trazodone  and Seroquel  as per psychiatry. 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   Outpatient ENT follow-up   Acute urinary retention A Foley catheter is noted.  Placed on 1/8 for urinary retention.  No known history of BPH.    Voiding trial can be attempted when patient is more ambulatory. Flomax was considered however patient has severe allergic reaction to sulfa medications including anaphylaxis. Continue to monitor     DVT prophylaxis: SQ Lovenox  Code Status: Full Family Communication: No family at bedside Disposition Plan: SNF is recommended     Consultants:  Psychiatry   Procedures:  None   Antimicrobials: None      Subjective: Patient seen this morning and examined continues to remain in  mittens Still appears confused Still has telemonitoring in place Denies nausea or vomiting   Physical Exam: General appearance: Awake alert.  In no distress.  Hard of hearing.  Distracted Resp: Clear to auscultation bilaterally.  Normal effort Cardio: S1-S2 is normal regular.  No S3-S4.  No rubs murmurs or bruit GI: Abdomen is soft.  Nontender nondistended.  Bowel sounds are present normal.  No masses organomegaly    Data Reviewed:    Latest Ref Rng & Units 07/12/2023    5:16 AM 07/11/2023    6:10 AM 07/08/2023    8:56 AM  CBC  WBC 4.0 - 10.5 K/uL 7.0  7.2  9.0   Hemoglobin 13.0 - 17.0 g/dL 56.2  13.0  86.5   Hematocrit 39.0 - 52.0 % 47.9  46.9  46.1   Platelets 150 - 400 K/uL 328  306  287        Latest Ref Rng & Units 07/12/2023    5:16 AM 07/11/2023    6:10 AM 07/09/2023    8:51 AM  BMP  Glucose 70 - 99 mg/dL 784  92  82   BUN 8 - 23 mg/dL 20  18  11    Creatinine 0.61 - 1.24 mg/dL 6.96  2.95  2.84   Sodium 135 - 145 mmol/L 140  142  138   Potassium 3.5 - 5.1 mmol/L 3.5  4.0  3.4   Chloride 98 - 111 mmol/L 103  105  103   CO2 22 - 32 mmol/L 26  26  20    Calcium 8.9 - 10.3 mg/dL 9.2  9.0  8.8  Vitals:   07/12/23 0118 07/12/23 0502 07/12/23 0857 07/12/23 0954  BP: 128/78 131/74  122/67  Pulse: 87 81  96  Resp: 18   19  Temp: 98.6 F (37 C) 98.6 F (37 C)  98.3 F (36.8 C)  TempSrc: Axillary Oral    SpO2: 100% 99%  100%  Weight:   71.7 kg   Height:   5\' 5"  (1.651 m)       Author: Ezzard Holms, MD 07/12/2023 4:36 PM  For on call review [email or web address].

## 2023-07-12 NOTE — Progress Notes (Signed)
 Physical Therapy Treatment Patient Details Name: Justin Bird MRN: [id number] DOB: 02/21/1941 Today's Date: 07/12/2023   History of Present Illness Pt is an 83 y/o M admitted on 06/29/23 after presenting with AMS. Head CT negative for acute issues (unable to obtain MRI 2/2 cochlear implant). PMH: HOH with cochlear implant, depression, Parkinson's disease    PT Comments  Patient seen in conjunction with OT to maximize patient's tolerance and further assessing cognition/visual impairment. Patient continues to be limited by cognition. Applied patient's L cochlear implant with improved ability to communicate with patient. Required modA for bed mobility and modA+2 for sit to stand and sidesteps at EOB. Posterior bias in standing. Patient reporting "15" people in the room and calling out for "Justin Bird" throughout session. Easily distracted and redirectable, however internally distracted throughout session. Discharge plan remains appropriate.     If plan is discharge home, recommend the following: Two people to help with walking and/or transfers;Two people to help with bathing/dressing/bathroom;Direct supervision/assist for medications management;Help with stairs or ramp for entrance;Assist for transportation;Assistance with feeding;Direct supervision/assist for financial management;Assistance with cooking/housework;Supervision due to cognitive status   Can travel by private vehicle     No  Equipment Recommendations  None recommended by PT    Recommendations for Other Services       Precautions / Restrictions Precautions Precautions: Fall Precaution Comments: L cochlear implant, decreased vision Restrictions Weight Bearing Restrictions Per Provider Order: No     Mobility  Bed Mobility Overal bed mobility: Needs Assistance Bed Mobility: Supine to Sit, Sit to Supine     Supine to sit: Mod assist Sit to supine: Mod assist   General bed mobility comments: min-modA but max multimodal  cuing with pt externally distracted    Transfers Overall transfer level: Needs assistance Equipment used: 2 person hand held assist Transfers: Sit to/from Stand Sit to Stand: +2 physical assistance, Mod assist                Ambulation/Gait                   Stairs             Wheelchair Mobility     Tilt Bed    Modified Rankin (Stroke Patients Only)       Balance Overall balance assessment: Needs assistance Sitting-balance support: Bilateral upper extremity supported, Feet unsupported Sitting balance-Leahy Scale: Fair Sitting balance - Comments: posterior lean at times, needs redirection for upright   Standing balance support: Bilateral upper extremity supported Standing balance-Leahy Scale: Poor                              Cognition Arousal: Alert Behavior During Therapy: Restless Overall Cognitive Status: No family/caregiver present to determine baseline cognitive functioning Area of Impairment: Orientation, Following commands, Safety/judgement, Attention, Memory, Awareness, Problem solving                 Orientation Level: Disoriented to, Person, Place, Time, Situation Current Attention Level: Focused   Following Commands: Follows one step commands inconsistently Safety/Judgement: Decreased awareness of safety, Decreased awareness of deficits Awareness: Intellectual Problem Solving: Slow processing, Requires verbal cues, Requires tactile cues, Decreased initiation General Comments: Pt confused, asking for "Justin Bird" frequently requiring redirection; talking about going to [place], restless attempting to pull out catheter.        Exercises      General Comments        Pertinent Vitals/Pain Pain  Assessment Pain Assessment: No/denies pain    Home Living                          Prior Function            PT Goals (current goals can now be found in the care plan section) Acute Rehab PT Goals PT  Goal Formulation: Patient unable to participate in goal setting Time For Goal Achievement: 07/14/23 Potential to Achieve Goals: Poor Progress towards PT goals: Progressing toward goals    Frequency    Min 1X/week      PT Plan      Co-evaluation PT/OT/SLP Co-Evaluation/Treatment: Yes Reason for Co-Treatment: For patient/therapist safety;Necessary to address cognition/behavior during functional activity;Complexity of the patient's impairments (multi-system involvement) PT goals addressed during session: Mobility/safety with mobility;Balance OT goals addressed during session: ADL's and self-care      AM-PAC PT "6 Clicks" Mobility   Outcome Measure  Help needed turning from your back to your side while in a flat bed without using bedrails?: A Lot Help needed moving from lying on your back to sitting on the side of a flat bed without using bedrails?: A Lot Help needed moving to and from a bed to a chair (including a wheelchair)?: Total Help needed standing up from a chair using your arms (e.g., wheelchair or bedside chair)?: A Lot Help needed to walk in hospital room?: Total Help needed climbing 3-5 steps with a railing? : Total 6 Click Score: 9    End of Session   Activity Tolerance: Other (comment) (limited by cognition) Patient left: in bed;with call bell/phone within reach;with bed alarm set;with restraints reapplied Nurse Communication: Mobility status PT Visit Diagnosis: Muscle weakness (generalized) (M62.81);Other abnormalities of gait and mobility (R26.89);Difficulty in walking, not elsewhere classified (R26.2)     Time: 9562-1308 PT Time Calculation (min) (ACUTE ONLY): 25 min  Charges:    $Therapeutic Activity: 8-22 mins PT General Charges $$ ACUTE PT VISIT: 1 Visit                     Janine Melbourne, PT, DPT Physical Therapist - [facility]  [facility]    Justin Bird 07/12/2023, 1:07 PM

## 2023-07-12 NOTE — Progress Notes (Signed)
 Occupational Therapy Treatment Patient Details Name: Justin Bird MRN: [id number] DOB: 06/16/1941 Today's Date: 07/12/2023   History of present illness Pt is an 83 y/o M admitted on 06/29/23 after presenting with AMS. Head CT negative for acute issues (unable to obtain MRI 2/2 cochlear implant). PMH: HOH with cochlear implant, depression, Parkinson's disease   OT comments  Pt seen as co-tx for pt and therapist safety. Pt restless, attempting to pull out catheter. Requires max multimodal cuing for all mobility and ADL task performance due to impairments in cognition/hearing/vision. Cochlear implant placed with hearing improving. Pt is able to identify correct colors of objects but is not able to identify objects by name (e.g. phone or washcloth). Reports "15" people in room but unclear if he was counting objects, frequently calling out to "[place]", requiring redirection. Sits EOB for ADL performance, and stands x2 from EOB with +2 HHA min-mod. Lateral steps with significant cuing. Left with telesitter present and bilat mitts donned. OT will continue to progress for functional gains. Discharge recommendation appropriate. Patient will benefit from continued inpatient follow up therapy, <3 hours/day       If plan is discharge home, recommend the following:  Two people to help with walking and/or transfers;Direct supervision/assist for financial management;Two people to help with bathing/dressing/bathroom;Direct supervision/assist for medications management;Supervision due to cognitive status;Assistance with cooking/housework;Help with stairs or ramp for entrance;Assist for transportation   Equipment Recommendations  Other (comment)    Recommendations for Other Services      Precautions / Restrictions Precautions Precautions: Fall Precaution Comments: L cochlear implant, decreased vision Restrictions Weight Bearing Restrictions Per Provider Order: No       Mobility Bed Mobility Overal  bed mobility: Needs Assistance Bed Mobility: Supine to Sit, Sit to Supine     Supine to sit: Mod assist Sit to supine: Mod assist   General bed mobility comments: min-modA but max multimodal cuing with pt externally distracted    Transfers Overall transfer level: Needs assistance Equipment used: 2 person hand held assist Transfers: Sit to/from Stand Sit to Stand: +2 physical assistance, Mod assist                 Balance Overall balance assessment: Needs assistance Sitting-balance support: Bilateral upper extremity supported, Feet unsupported Sitting balance-Leahy Scale: Fair Sitting balance - Comments: posterior lean at times, needs redirection for upright   Standing balance support: Bilateral upper extremity supported Standing balance-Leahy Scale: Poor                             ADL either performed or assessed with clinical judgement   ADL Overall ADL's : Needs assistance/impaired Eating/Feeding: Minimal assistance;Sitting Eating/Feeding Details (indicate cue type and reason): required max multimodal cuing to visually locate water  cup, and sip from straw Grooming: Wash/dry hands;Wash/dry face;Brushing hair;Minimal assistance;Sitting Grooming Details (indicate cue type and reason): minA to visually locate tools, once provided tools, pt was able to wash face requiring assistance for thoroughness. Pt unable to verbalize names of items (agnosia (??)) but performed functionality correctly.                             Functional mobility during ADLs: +2 for physical assistance;+2 for safety/equipment General ADL Comments: Limited by hearing/visual/cognitive impairments. Pt continues to perseverate on catheter, requiring hand held assist to redirect. Pt frequently calling out for "Leola Raisin". Able to perform bed mobility and stand x2 with +  2 HHA mod assist.      Cognition Arousal: Alert Behavior During Therapy: Restless Overall Cognitive Status: No  family/caregiver present to determine baseline cognitive functioning Area of Impairment: Orientation, Following commands, Safety/judgement, Attention, Memory, Awareness, Problem solving                 Orientation Level: Disoriented to, Person, Place, Time, Situation Current Attention Level: Focused   Following Commands: Follows one step commands inconsistently Safety/Judgement: Decreased awareness of safety, Decreased awareness of deficits Awareness: Intellectual Problem Solving: Slow processing, Requires verbal cues, Requires tactile cues, Decreased initiation General Comments: Pt confused, asking for "Le[place] Raisin" frequently requiring redirection; talking about going to [place], restless attempting to pull out catheter.                   Pertinent Vitals/ Pain       Pain Assessment Pain Assessment: No/denies pain   Frequency  Min 1X/week        Progress Toward Goals  OT Goals(current goals can now be found in the care plan section)  Progress towards OT goals: Progressing toward goals  Acute Rehab OT Goals OT Goal Formulation: Patient unable to participate in goal setting Time For Goal Achievement: 07/14/23 ADL Goals Pt Will Perform Eating: sitting;with supervision;with set-up Pt Will Perform Grooming: sitting;with set-up;with supervision Pt Will Perform Upper Body Dressing: sitting;with min assist Pt Will Perform Lower Body Dressing: sit to/from stand;with min assist;with adaptive equipment  Plan      Co-evaluation    PT/OT/SLP Co-Evaluation/Treatment: Yes Reason for Co-Treatment: For patient/therapist safety;Necessary to address cognition/behavior during functional activity;Complexity of the patient's impairments (multi-system involvement) PT goals addressed during session: Mobility/safety with mobility;Balance OT goals addressed during session: ADL's and self-care      AM-PAC OT "6 Clicks" Daily Activity     Outcome Measure   Help from another  person eating meals?: A Little Help from another person taking care of personal grooming?: A Lot Help from another person toileting, which includes using toliet, bedpan, or urinal?: Total Help from another person bathing (including washing, rinsing, drying)?: Total Help from another person to put on and taking off regular upper body clothing?: A Lot Help from another person to put on and taking off regular lower body clothing?: Total 6 Click Score: 10    End of Session    OT Visit Diagnosis: Other abnormalities of gait and mobility (R26.89);Other symptoms and signs involving cognitive function   Activity Tolerance Patient tolerated treatment well   Patient Left in bed;with call bell/phone within reach;with bed alarm set;with nursing/sitter in room (bilat mitts donned)   Nurse Communication Mobility status        Time: 8657-8469 OT Time Calculation (min): 26 min  Charges: OT General Charges $OT Visit: 1 Visit OT Treatments $Self Care/Home Management : 8-22 mins  Stepanie Graver L. Isyss Espinal, OTR/L  07/12/23, 1:02 PM

## 2023-07-12 NOTE — Plan of Care (Signed)
  Problem: Safety: Goal: Non-violent Restraint(s) Outcome: Progressing   Problem: Health Behavior/Discharge Planning: Goal: Ability to manage health-related needs will improve Outcome: Progressing   Problem: Clinical Measurements: Goal: Ability to maintain clinical measurements within normal limits will improve Outcome: Progressing Goal: Will remain free from infection Outcome: Progressing Goal: Diagnostic test results will improve Outcome: Progressing Goal: Respiratory complications will improve Outcome: Progressing Goal: Cardiovascular complication will be avoided Outcome: Progressing   Problem: Activity: Goal: Risk for activity intolerance will decrease Outcome: Progressing   Problem: Nutrition: Goal: Adequate nutrition will be maintained Outcome: Progressing   Problem: Coping: Goal: Level of anxiety will decrease Outcome: Progressing   Problem: Elimination: Goal: Will not experience complications related to bowel motility Outcome: Progressing Goal: Will not experience complications related to urinary retention Outcome: Progressing   Problem: Pain Management: Goal: General experience of comfort will improve Outcome: Progressing   Problem: Safety: Goal: Ability to remain free from injury will improve Outcome: Progressing   Problem: Skin Integrity: Goal: Risk for impaired skin integrity will decrease Outcome: Progressing

## 2023-07-13 DIAGNOSIS — F05 Delirium due to known physiological condition: Secondary | ICD-10-CM | POA: Diagnosis not present

## 2023-07-13 DIAGNOSIS — G9341 Metabolic encephalopathy: Secondary | ICD-10-CM | POA: Diagnosis not present

## 2023-07-13 LAB — CBC WITH DIFFERENTIAL/PLATELET
Abs Immature Granulocytes: 0.02 10*3/uL (ref 0.00–0.07)
Basophils Absolute: 0 10*3/uL (ref 0.0–0.1)
Basophils Relative: 1 %
Eosinophils Absolute: 0.2 10*3/uL (ref 0.0–0.5)
Eosinophils Relative: 4 %
HCT: 45.2 % (ref 39.0–52.0)
Hemoglobin: 15.5 g/dL (ref 13.0–17.0)
Immature Granulocytes: 0 %
Lymphocytes Relative: 27 %
Lymphs Abs: 1.6 10*3/uL (ref 0.7–4.0)
MCH: 30.2 pg (ref 26.0–34.0)
MCHC: 34.3 g/dL (ref 30.0–36.0)
MCV: 88.1 fL (ref 80.0–100.0)
Monocytes Absolute: 0.6 10*3/uL (ref 0.1–1.0)
Monocytes Relative: 11 %
Neutro Abs: 3.3 10*3/uL (ref 1.7–7.7)
Neutrophils Relative %: 57 %
Platelets: 311 10*3/uL (ref 150–400)
RBC: 5.13 MIL/uL (ref 4.22–5.81)
RDW: 12.6 % (ref 11.5–15.5)
WBC: 5.8 10*3/uL (ref 4.0–10.5)
nRBC: 0 % (ref 0.0–0.2)

## 2023-07-13 LAB — BASIC METABOLIC PANEL
Anion gap: 12 (ref 5–15)
BUN: 16 mg/dL (ref 8–23)
CO2: 26 mmol/L (ref 22–32)
Calcium: 9 mg/dL (ref 8.9–10.3)
Chloride: 104 mmol/L (ref 98–111)
Creatinine, Ser: 0.7 mg/dL (ref 0.61–1.24)
GFR, Estimated: >60 mL/min (ref >60)
Glucose, Bld: 97 mg/dL (ref 70–99)
Potassium: 3.8 mmol/L (ref 3.5–5.1)
Sodium: 142 mmol/L (ref 135–145)

## 2023-07-13 MED ORDER — VALPROIC ACID 250 MG PO CAPS
250.0000 mg | ORAL_CAPSULE | Freq: Two times a day (BID) | ORAL | Status: DC
  Administered 2023-07-13 – 2023-07-14 (×2): 250 mg via ORAL
  Filled 2023-07-13 (×2): qty 1

## 2023-07-13 MED ORDER — QUETIAPINE FUMARATE 25 MG PO TABS
150.0000 mg | ORAL_TABLET | Freq: Three times a day (TID) | ORAL | Status: DC
  Administered 2023-07-13 – 2023-07-15 (×8): 150 mg via ORAL
  Filled 2023-07-13 (×9): qty 6

## 2023-07-13 NOTE — Progress Notes (Signed)
Progress Note   Patient: Justin Bird ZOX:[id number] DOB: 11-May-1941 DOA: 06/29/2023     13 DOS: the patient was seen and examined on 07/13/2023   Brief Narrative:  83 y.o. male with medical history significant of hard of hearing, depression, Parkinson's disease, allergy, who presents with altered mental status.   Assessment & Plan:   Acute metabolic encephalopathy:  Etiology is not clear.  CT head negative for acute intracranial abnormalities.  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   Potential differential diagnosis to include early stage of dementia, delirium B12 and TSH within normal limits.   Patient has possibly missed some dose of Sinemet, which may have contributed partially due to withdrawal.   Patient received IV Ativan, IV Haldol, IM Zyprexa in ED.  Still not a candidate for inpatient psych admission according to psych team Patient remains on Sinemet for his Parkinson's. Remains on Lexapro trazodone and Seroquel as per psychiatry. 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   Outpatient ENT follow-up   Acute urinary retention A Foley catheter is noted.  Placed on 1/8 for urinary retention.  No known history of BPH.    Voiding trial can be attempted when patient is more ambulatory. Flomax was considered however patient has severe allergic reaction to sulfa medications including anaphylaxis. Continue to monitor     DVT prophylaxis: SQ Lovenox Code Status: Full Family Communication: No family at bedside Disposition Plan: SNF is recommended     Consultants:  Psychiatry   Procedures:  None   Antimicrobials: None      Subjective: Patient seen this morning and examined continues to remain in  mittens Patient still confused but improving Continues to have telemetry monitor in place   Physical Exam: General appearance: Awake alert.  In no distress.  Hard of hearing.  Distracted Resp: Clear to auscultation bilaterally.  Normal effort Cardio: S1-S2 is normal regular.  No S3-S4.  No rubs murmurs or bruit GI: Abdomen is soft.  Nontender nondistended.  Bowel sounds are present normal.  No masses organomegaly    Data Reviewed:    Latest Ref Rng & Units 07/13/2023    4:43 AM 07/12/2023    5:16 AM 07/11/2023    6:10 AM  BMP  Glucose 70 - 99 mg/dL 97  811  92   BUN 8 - 23 mg/dL 16  20  18    Creatinine 0.61 - 1.24 mg/dL 9.14  7.82  9.56   Sodium 135 - 145 mmol/L 142  140  142   Potassium 3.5 - 5.1 mmol/L 3.8  3.5  4.0   Chloride 98 - 111 mmol/L 104  103  105   CO2 22 - 32 mmol/L 26  26  26    Calcium 8.9 - 10.3 mg/dL 9.0  9.2  9.0        Latest Ref Rng & Units 07/13/2023    4:43 AM 07/12/2023    5:16 AM 07/11/2023    6:10 AM  CBC  WBC 4.0 - 10.5 K/uL 5.8  7.0  7.2   Hemoglobin 13.0 - 17.0 g/dL 21.3  08.6  57.8   Hematocrit 39.0 - 52.0 % 45.2  47.9  46.9   Platelets 150 - 400 K/uL 311  328  306  Vitals:   07/12/23 1659 07/12/23 2041 07/13/23 0458 07/13/23 0822  BP: 136/66 132/70 122/79 (!) 146/70  Pulse: 90 88 98 90  Resp: 19 18 18 18   Temp: 98 F (36.7 C) 97.8 F (36.6 C) 98.4 F (36.9 C)   TempSrc:      SpO2: 99% 95% 98% 95%  Weight:      Height:        Author: Loyce Dys, MD 07/13/2023 2:36 PM  For on call review [email or web address].

## 2023-07-13 NOTE — Consult Note (Signed)
[facility] Psychiatric Consult Follow-up  Patient Name: .Justin Bird  MRN: [id number]  DOB: [birth year]  Consult Order details:  Orders (From admission, onward)     Start     Ordered   07/04/23 1329  IP CONSULT TO PSYCHIATRY       Ordering Provider: Gillis Santa, MD  Provider:  (Not yet assigned)  Question Answer Comment  Location [facility]   Reason for Consult? Agitation, AMS      07/04/23 1328             Mode of Visit: In person    Psychiatry Consult Evaluation  Service Date: July 13, 2023 LOS:  LOS: 13 days  Chief Complaint confusion  Primary Psychiatric Diagnoses  Delirium due to multiple etiologies   Assessment  Justin Bird is a 83 y.o. male w/ history of hard of hearing, depression, anxiety, parkinsons, who was medically admitted on 06/29/2023 w/ concerns for acute metabolic encephalopathy.    On assessment today, patient is noted to be sedated and resting in the bed.  He has hard of hearing and does not respond to any verbal commands as his cochlear implants do not work.  He is so confused that he cannot even read the written statements or questions.  Per nursing patient became very restless and agitated in the morning where he was pulling out the catheters.  He received IM Geodon as as needed.  Nurse reports that he received Geodon before his morning dose of Seroquel.  Discussed the plan of increasing the Seroquel dose if his agitation and confusion continues.    Diagnoses:  Active Hospital problems: Principal Problem:   Acute metabolic encephalopathy Active Problems:   Parkinson's disease (HCC)    Plan   ## Psychiatric Medication Recommendations:  Increase Seroquel 150mg  oral 3 times daily -continue trazodone 150mg  oral daily at bedtime -continue geodon 10mg  IM every 4 hours PRN agitation -continue lexapro 5mg  oral daily Will consider adding depakene if agitation continues.- labs reviewed 06/29/23- LFTs WNL  ##  Medical Decision Making Capacity:  Not specifically addressed in this encounter   ## Further Work-up:  -- defer further work-up to hospitalist -- repeat EKG ordered   ## Disposition:-- Pt does not meet criteria for inpatient psychiatric admission   ## Behavioral / Environmental: -Delirium Precautions: Delirium Interventions for Nursing and Staff: - RN to open blinds every AM. - To Bedside: Glasses, hearing aide, and pt's own shoes. Make available to patients. when possible and encourage use. - Encourage po fluids when appropriate, keep fluids within reach. - OOB to chair with meals. - Passive ROM exercises to all extremities with AM & PM care. - RN to assess orientation to person, time and place QAM and PRN. - Recommend extended visitation hours with familiar family/friends as feasible. - Staff to minimize disturbances at night. Turn off television when pt asleep or when not in use.              ## Safety and Observation Level:  - Based on my clinical evaluation, I estimate the patient to be at NO risk of self harm in the current setting. - At this time, we recommend  routine. This decision is based on my review of the chart including patient's history and current presentation, interview of the patient, mental status examination, and consideration of suicide risk including evaluating suicidal ideation, plan, intent, suicidal or self-harm behaviors, risk factors, and protective factors. This judgment is based on our ability to directly address  suicide risk, implement suicide prevention strategies, and develop a safety plan while the patient is in the clinical setting. Please contact our team if there is a concern that risk level has changed.   CSSR Risk Category:C-SSRS RISK CATEGORY: No Risk   Suicide Risk Assessment: Patient has following non-modifiable or demographic risk factors for suicide: male gender Patient has the following protective factors against suicide: Supportive family   Thank you  for this consult request. Recommendations have been communicated to the primary team.  We will continue to follow at this time.   Verner Chol, MD       History of Present Illness  Patient Report: Patient is noted to be heavily sedated resting in the bed.  He did not respond to verbal commands as he is hard of hearing with failed cochlear implants.  He received as needed Geodon in the morning and he did not wake up to answer any questions. Per nursing patient became very restless and agitated in the morning where he was pulling out the catheters.  He received IM Geodon as as needed.  Nurse reports that he received Geodon before his morning dose of Seroquel.  Discussed the plan of increasing the Seroquel dose if his agitation and confusion continues.  Psych ROS:  Depression: Unable to assess Anxiety: Unable to assess Mania (lifetime and current): Unable to assess Psychosis: (lifetime and current): Unable to assess     Psychiatric and Social History  Psychiatric History:    Prev Dx/Sx: Per chart review, history of depression/anxiety Previous Med Trials: Per chart review, had been on lexapro previously Prior Psych Hospitalization: Do not see history of psych hospitalization in chart   Exam Findings  Physical Exam: Reviewed and agree with the medical examination and physical exam conducted by the medical provider/hospitalist. Vital Signs:  Temp:  [97.8 F (36.6 C)-98.4 F (36.9 C)] 98.4 F (36.9 C) (01/16 0458) Pulse Rate:  [88-98] 98 (01/16 0458) Resp:  [18-19] 18 (01/16 0458) BP: (122-136)/(66-79) 122/79 (01/16 0458) SpO2:  [95 %-100 %] 98 % (01/16 0458) Weight:  [71.7 kg] 71.7 kg (01/15 0857) Blood pressure 122/79, pulse 98, temperature 98.4 F (36.9 C), resp. rate 18, height 5\' 5"  (1.651 m), weight 71.7 kg, SpO2 98%. Body mass index is 26.3 kg/m.  Physical Exam  Mental Status Exam: General Appearance:  In hospital gown with mittens on  Orientation:  NA  Memory:  NA   Concentration: Unable to assess  Recall: Unable to assess  Attention unable to assess  Eye Contact: Unable to assess  Speech: Unable to assess  Language: Unable to assess  Volume: Unable to assess  Mood: Unable to assess  Affect: Unable to assess  Thought Process: Unable to assess  Thought Content: Unable to assess  Suicidal Thoughts: Unable to assess  Homicidal Thoughts: Unable to assess  Judgement: Unable to assess  Insight: Unable to assess  Psychomotor Activity: Restless  Akathisia: No  Fund of Knowledge: Unable to assess      Assets:  Financial Resources/Insurance  Cognition:  Impaired,  Severe  ADL's:  Impaired  AIMS (if indicated):        Other History   These have been pulled in through the EMR, reviewed, and updated if appropriate.  Family History:  The patient's family history includes Lung cancer in his mother.  Medical History: Past Medical History:  Diagnosis Date   Allergy    Hearing deficit    Parkinson's disease [facility])     Surgical History: Past  Surgical History:  Procedure Laterality Date   COCHLEAR IMPLANT Left    PARTIAL COLECTOMY       Medications:   Current Facility-Administered Medications:    acetaminophen (TYLENOL) tablet 650 mg, 650 mg, Oral, Q6H PRN, Lorretta Harp, MD, 650 mg at 07/08/23 2134   [COMPLETED] bisacodyl (DULCOLAX) EC tablet 10 mg, 10 mg, Oral, BID, 10 mg at 07/06/23 2122 **FOLLOWED BY** bisacodyl (DULCOLAX) EC tablet 10 mg, 10 mg, Oral, QHS, Gillis Santa, MD, 10 mg at 07/12/23 2123   bisacodyl (DULCOLAX) suppository 10 mg, 10 mg, Rectal, Daily PRN, Gillis Santa, MD, 10 mg at 07/13/23 0116   carbidopa-levodopa (SINEMET IR) 25-100 MG per tablet immediate release 2 tablet, 2 tablet, Oral, TID, Phineas Semen, MD, 2 tablet at 07/12/23 2123   Chlorhexidine Gluconate Cloth 2 % PADS 6 each, 6 each, Topical, Daily, Gillis Santa, MD, 6 each at 07/12/23 1700   cyanocobalamin (VITAMIN B12) tablet 1,000 mcg, 1,000 mcg, Oral, Daily,  Lorretta Harp, MD, 1,000 mcg at 07/12/23 1236   doxazosin (CARDURA) tablet 1 mg, 1 mg, Oral, Daily, Gillis Santa, MD, 1 mg at 07/12/23 1237   enoxaparin (LOVENOX) injection 40 mg, 40 mg, Subcutaneous, Q24H, Lorretta Harp, MD, 40 mg at 07/12/23 2125   escitalopram (LEXAPRO) tablet 5 mg, 5 mg, Oral, Daily, Lauree Chandler, NP, 5 mg at 07/12/23 1237   feeding supplement (ENSURE ENLIVE / ENSURE PLUS) liquid 237 mL, 237 mL, Oral, BID BM, Gillis Santa, MD, 237 mL at 07/12/23 1235   hydrALAZINE (APRESOLINE) injection 5 mg, 5 mg, Intravenous, Q2H PRN, Lorretta Harp, MD   latanoprost (XALATAN) 0.005 % ophthalmic solution 1 drop, 1 drop, Both Eyes, QHS, Niu, Brien Few, MD, 1 drop at 07/11/23 2236   ondansetron (ZOFRAN) injection 4 mg, 4 mg, Intravenous, Q8H PRN, Lorretta Harp, MD   polyethylene glycol (MIRALAX / GLYCOLAX) packet 17 g, 17 g, Oral, BID, Gillis Santa, MD, 17 g at 07/12/23 2125   polyvinyl alcohol (LIQUIFILM TEARS) 1.4 % ophthalmic solution 2 drop, 2 drop, Both Eyes, PRN, Sreenath, Sudheer B, MD   QUEtiapine (SEROQUEL) tablet 100 mg, 100 mg, Oral, TID, Gillis Santa, MD, 100 mg at 07/12/23 2120   traZODone (DESYREL) tablet 150 mg, 150 mg, Oral, QHS, Sreenath, Sudheer B, MD, 150 mg at 07/12/23 2123   ziprasidone (GEODON) injection 10 mg, 10 mg, Intramuscular, Q4H PRN, Lolita Patella B, MD, 10 mg at 07/12/23 2135  Allergies: Allergies  Allergen Reactions   Opium Poppy [Papaver] Anaphylaxis and Hives   Penicillins Anaphylaxis and Rash   Sulfa Antibiotics Anaphylaxis   Penicillins    Sulfa Antibiotics     Verner Chol, MD

## 2023-07-13 NOTE — Progress Notes (Signed)
[facility] Psychiatric Consult Follow-up  Patient Name: .Justin Bird  MRN: [id number]  DOB: 06-Oct-1940  Consult Order details:  Orders (From admission, onward)     Start     Ordered   07/04/23 1329  IP CONSULT TO PSYCHIATRY       Ordering Provider: Gillis Santa, MD  Provider:  (Not yet assigned)  Question Answer Comment  Location [facility]   Reason for Consult? Agitation, AMS      07/04/23 1328             Mode of Visit: In person    Psychiatry Consult Evaluation  Service Date: July 13, 2023 LOS:  LOS: 13 days  Chief Complaint confusion/agitation  Primary Psychiatric Diagnoses  Delirium due to multiple etiologies   Assessment   Justin Bird is a 83 y.o. male w/ history of hard of hearing, depression, anxiety, parkinsons, who was medically admitted on 06/29/2023 w/ concerns for acute metabolic encephalopathy.  Patient continues to be confused and restless.  There are episodes of agitation due to confusion and lack of communication.  Patient has hard of hearing and is confused that he cannot read instructions.  Increased Seroquel to 150 mg 3 times daily and added Depakene to 50 mg twice daily for further mood stabilization.  Reviewed the labs and LFTs are normal done in January 2025. Diagnoses:  Active Hospital problems: Principal Problem:   Acute metabolic encephalopathy Active Problems:   Parkinson's disease (HCC)    Plan   ## Psychiatric Medication Recommendations:  ncrease Seroquel 150mg  oral 3 times daily -continue trazodone 150mg  oral daily at bedtime -continue geodon 10mg  IM every 4 hours PRN agitation -continue lexapro 5mg  oral daily Added depakene 250 mg bid if agitation continues.- labs reviewed 06/29/23- LFTs WNL  ## Medical Decision Making Capacity:  Not specifically addressed in this encounter   ## Further Work-up:  -- defer further work-up to hospitalist -- repeat EKG ordered   ## Disposition:-- Pt does not  meet criteria for inpatient psychiatric admission   ## Behavioral / Environmental: -Delirium Precautions: Delirium Interventions for Nursing and Staff: - RN to open blinds every AM. - To Bedside: Glasses, hearing aide, and pt's own shoes. Make available to patients. when possible and encourage use. - Encourage po fluids when appropriate, keep fluids within reach. - OOB to chair with meals. - Passive ROM exercises to all extremities with AM & PM care. - RN to assess orientation to person, time and place QAM and PRN. - Recommend extended visitation hours with familiar family/friends as feasible. - Staff to minimize disturbances at night. Turn off television when pt asleep or when not in use.              ## Safety and Observation Level:  - Based on my clinical evaluation, I estimate the patient to be at NO risk of self harm in the current setting. - At this time, we recommend  routine. This decision is based on my review of the chart including patient's history and current presentation, interview of the patient, mental status examination, and consideration of suicide risk including evaluating suicidal ideation, plan, intent, suicidal or self-harm behaviors, risk factors, and protective factors. This judgment is based on our ability to directly address suicide risk, implement suicide prevention strategies, and develop a safety plan while the patient is in the clinical setting. Please contact our team if there is a concern that risk level has changed.   CSSR Risk Category:C-SSRS RISK CATEGORY: No  Risk   Suicide Risk Assessment: Patient has following non-modifiable or demographic risk factors for suicide: male gender Patient has the following protective factors against suicide: Supportive family   Thank you for this consult request. Recommendations have been communicated to the primary team.  We will continue to follow at this time.    Verner Chol, MD                                       History of  Present Illness  Patient Report: Patient noted to be restless in the bed, naked, trying to pull on the mittens.  He made eye contact with the provider but did not communicate.  Provider tried different means of communicating with sign language and writing but patient did not respond.  He was noted to be talking nonsensical before provider entered into the room.  He did not offer any complaints did not endorse SI/HI/plan.  Discussed the plan with Discussed the plan of increasing the Seroquel dose if his agitation and confusion continues.  Psych ROS:  Depression: Unable to assess Anxiety: Unable to assess Mania (lifetime and current): Unable to assess Psychosis: (lifetime and current): Unable to assess         Psychiatric and Social History  Psychiatric History:    Prev Dx/Sx: Per chart review, history of depression/anxiety Previous Med Trials: Per chart review, had been on lexapro previously Prior Psych Hospitalization: Do not see history of psych hospitalization in chart  Exam Findings  Physical Exam: Reviewed and agree with the physical findings of the examination conducted by the medical provider/hospitalist Vital Signs:  Temp:  [97.8 F (36.6 C)-98.4 F (36.9 C)] 98.4 F (36.9 C) (01/16 0458) Pulse Rate:  [88-98] 90 (01/16 0822) Resp:  [18-19] 18 (01/16 0822) BP: (122-146)/(66-79) 146/70 (01/16 0822) SpO2:  [95 %-99 %] 95 % (01/16 0822) Blood pressure (!) 146/70, pulse 90, temperature 98.4 F (36.9 C), resp. rate 18, height 5\' 5"  (1.651 m), weight 71.7 kg, SpO2 95%. Body mass index is 26.3 kg/m.  Physical Exam  Mental Status Exam: General Appearance: Disheveled  Orientation:  Negative  Memory:  Negative  Concentration:  Concentration: Poor  Recall:  Poor  Attention  Poor  Eye Contact:  Poor  Speech:  Garbled  Language:  Poor  Volume:  Decreased  Mood:   Affect:  Labile  Thought Process:  Disorganized  Thought Content:  Rumination  Suicidal Thoughts:  No   Homicidal Thoughts:  No  Judgement:  Poor  Insight:  Lacking  Psychomotor Activity:  Restlessness  Akathisia:  No  Fund of Knowledge:  Poor      Assets:  Talents/Skills  Cognition:  Impaired,  Severe  ADL's:  Impaired  AIMS (if indicated):        Other History   These have been pulled in through the EMR, reviewed, and updated if appropriate.  Family History:  The patient's family history includes Lung cancer in his mother.  Medical History: Past Medical History:  Diagnosis Date   Allergy    Hearing deficit    Parkinson's disease [facility])     Surgical History: Past Surgical History:  Procedure Laterality Date   COCHLEAR IMPLANT Left    PARTIAL COLECTOMY       Medications:   Current Facility-Administered Medications:    acetaminophen (TYLENOL) tablet 650 mg, 650 mg, Oral, Q6H PRN, Lorretta Harp, MD, 650 mg at  07/08/23 2134   [COMPLETED] bisacodyl (DULCOLAX) EC tablet 10 mg, 10 mg, Oral, BID, 10 mg at 07/06/23 2122 **FOLLOWED BY** bisacodyl (DULCOLAX) EC tablet 10 mg, 10 mg, Oral, QHS, Gillis Santa, MD, 10 mg at 07/12/23 2123   bisacodyl (DULCOLAX) suppository 10 mg, 10 mg, Rectal, Daily PRN, Gillis Santa, MD, 10 mg at 07/13/23 0116   carbidopa-levodopa (SINEMET IR) 25-100 MG per tablet immediate release 2 tablet, 2 tablet, Oral, TID, Phineas Semen, MD, 2 tablet at 07/13/23 1041   Chlorhexidine Gluconate Cloth 2 % PADS 6 each, 6 each, Topical, Daily, Gillis Santa, MD, 6 each at 07/13/23 1042   cyanocobalamin (VITAMIN B12) tablet 1,000 mcg, 1,000 mcg, Oral, Daily, Lorretta Harp, MD, 1,000 mcg at 07/13/23 1041   doxazosin (CARDURA) tablet 1 mg, 1 mg, Oral, Daily, Gillis Santa, MD, 1 mg at 07/13/23 1041   enoxaparin (LOVENOX) injection 40 mg, 40 mg, Subcutaneous, Q24H, Lorretta Harp, MD, 40 mg at 07/12/23 2125   escitalopram (LEXAPRO) tablet 5 mg, 5 mg, Oral, Daily, Lauree Chandler, NP, 5 mg at 07/13/23 1041   feeding supplement (ENSURE ENLIVE / ENSURE PLUS) liquid 237 mL,  237 mL, Oral, BID BM, Gillis Santa, MD, 237 mL at 07/13/23 1042   hydrALAZINE (APRESOLINE) injection 5 mg, 5 mg, Intravenous, Q2H PRN, Lorretta Harp, MD   latanoprost (XALATAN) 0.005 % ophthalmic solution 1 drop, 1 drop, Both Eyes, QHS, Niu, Brien Few, MD, 1 drop at 07/11/23 2236   ondansetron (ZOFRAN) injection 4 mg, 4 mg, Intravenous, Q8H PRN, Lorretta Harp, MD   polyethylene glycol (MIRALAX / GLYCOLAX) packet 17 g, 17 g, Oral, BID, Gillis Santa, MD, 17 g at 07/13/23 1041   polyvinyl alcohol (LIQUIFILM TEARS) 1.4 % ophthalmic solution 2 drop, 2 drop, Both Eyes, PRN, Sreenath, Sudheer B, MD   QUEtiapine (SEROQUEL) tablet 150 mg, 150 mg, Oral, TID, Verner Chol, MD, 150 mg at 07/13/23 1041   traZODone (DESYREL) tablet 150 mg, 150 mg, Oral, QHS, Sreenath, Sudheer B, MD, 150 mg at 07/12/23 2123   valproic acid (DEPAKENE) 250 MG capsule 250 mg, 250 mg, Oral, BID, Irwin Brakeman, Teren Zurcher, MD   ziprasidone (GEODON) injection 10 mg, 10 mg, Intramuscular, Q4H PRN, Georgeann Oppenheim, Sudheer B, MD, 10 mg at 07/12/23 2135  Allergies: Allergies  Allergen Reactions   Opium Poppy [Papaver] Anaphylaxis and Hives   Penicillins Anaphylaxis and Rash   Sulfa Antibiotics Anaphylaxis   Penicillins    Sulfa Antibiotics     Verner Chol, MD

## 2023-07-13 NOTE — Progress Notes (Signed)
Mitts removed from hands and their purpose was to reduce the risk of him pulling at lines and tubes. He no longer has any lines or tubes to pull.

## 2023-07-13 NOTE — Progress Notes (Signed)
HS medications were scanned, meds were crushed in sherbet and given to patient, system logged out prior to being able to accept meds, so they had to be overridden.

## 2023-07-14 DIAGNOSIS — F05 Delirium due to known physiological condition: Secondary | ICD-10-CM | POA: Diagnosis not present

## 2023-07-14 DIAGNOSIS — G9341 Metabolic encephalopathy: Secondary | ICD-10-CM | POA: Diagnosis not present

## 2023-07-14 LAB — CBC WITH DIFFERENTIAL/PLATELET
Abs Immature Granulocytes: 0.01 10*3/uL (ref 0.00–0.07)
Basophils Absolute: 0 10*3/uL (ref 0.0–0.1)
Basophils Relative: 1 %
Eosinophils Absolute: 0.2 10*3/uL (ref 0.0–0.5)
Eosinophils Relative: 4 %
HCT: 48.9 % (ref 39.0–52.0)
Hemoglobin: 16.1 g/dL (ref 13.0–17.0)
Immature Granulocytes: 0 %
Lymphocytes Relative: 24 %
Lymphs Abs: 1.3 10*3/uL (ref 0.7–4.0)
MCH: 29.9 pg (ref 26.0–34.0)
MCHC: 32.9 g/dL (ref 30.0–36.0)
MCV: 90.9 fL (ref 80.0–100.0)
Monocytes Absolute: 0.6 10*3/uL (ref 0.1–1.0)
Monocytes Relative: 11 %
Neutro Abs: 3.4 10*3/uL (ref 1.7–7.7)
Neutrophils Relative %: 60 %
Platelets: 318 10*3/uL (ref 150–400)
RBC: 5.38 MIL/uL (ref 4.22–5.81)
RDW: 12.7 % (ref 11.5–15.5)
WBC: 5.7 10*3/uL (ref 4.0–10.5)
nRBC: 0 % (ref 0.0–0.2)

## 2023-07-14 LAB — BASIC METABOLIC PANEL
Anion gap: 12 (ref 5–15)
BUN: 12 mg/dL (ref 8–23)
CO2: 28 mmol/L (ref 22–32)
Calcium: 9.3 mg/dL (ref 8.9–10.3)
Chloride: 105 mmol/L (ref 98–111)
Creatinine, Ser: 0.59 mg/dL — ABNORMAL LOW (ref 0.61–1.24)
GFR, Estimated: >60 mL/min (ref >60)
Glucose, Bld: 99 mg/dL (ref 70–99)
Potassium: 3.7 mmol/L (ref 3.5–5.1)
Sodium: 145 mmol/L (ref 135–145)

## 2023-07-14 MED ORDER — DIVALPROEX SODIUM 125 MG PO CSDR
250.0000 mg | DELAYED_RELEASE_CAPSULE | Freq: Two times a day (BID) | ORAL | Status: AC
  Administered 2023-07-14 – 2023-07-23 (×19): 250 mg via ORAL
  Filled 2023-07-14 (×20): qty 2

## 2023-07-14 NOTE — Progress Notes (Signed)
PT Cancellation Note  Patient Details Name: Justin Bird MRN: [id number] DOB: September 25, 1940   Cancelled Treatment:    Reason Eval/Treat Not Completed: Other (comment) (Cognition) Patient is unable to participate in PT due to poor cognition. NT in room feeding him. Will continue to re-assess for ability to participate in PT.   Reality Dejonge 07/14/2023, 10:06 AM

## 2023-07-14 NOTE — Plan of Care (Signed)
  Problem: Safety: Goal: Non-violent Restraint(s) Outcome: Progressing   Problem: Education: Goal: Knowledge of General Education information will improve Description: Including pain rating scale, medication(s)/side effects and non-pharmacologic comfort measures Outcome: Not Progressing   Problem: Health Behavior/Discharge Planning: Goal: Ability to manage health-related needs will improve Outcome: Not Progressing   Problem: Clinical Measurements: Goal: Ability to maintain clinical measurements within normal limits will improve Outcome: Progressing Goal: Will remain free from infection Outcome: Progressing Goal: Diagnostic test results will improve Outcome: Progressing Goal: Respiratory complications will improve Outcome: Progressing Goal: Cardiovascular complication will be avoided Outcome: Progressing   Problem: Activity: Goal: Risk for activity intolerance will decrease Outcome: Progressing   Problem: Nutrition: Goal: Adequate nutrition will be maintained Outcome: Progressing   Problem: Coping: Goal: Level of anxiety will decrease Outcome: Progressing   Problem: Elimination: Goal: Will not experience complications related to bowel motility Outcome: Progressing Goal: Will not experience complications related to urinary retention Outcome: Progressing   Problem: Pain Management: Goal: General experience of comfort will improve Outcome: Progressing   Problem: Safety: Goal: Ability to remain free from injury will improve Outcome: Progressing   Problem: Skin Integrity: Goal: Risk for impaired skin integrity will decrease Outcome: Progressing

## 2023-07-14 NOTE — Progress Notes (Signed)
Progress Note   Patient: Justin Bird UYQ:[id number] DOB: [birth year] DOA: 06/29/2023     14 DOS: the patient was seen and examined on 07/14/2023      Brief Narrative:  83 y.o. male with medical history significant of hard of hearing, depression, Parkinson's disease, allergy, who presents with altered mental status.   Assessment & Plan:   Acute metabolic encephalopathy:  Etiology is not clear.  CT head negative for acute intracranial abnormalities.  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   Potential differential diagnosis to include early stage of dementia, delirium B12 and TSH within normal limits.   Patient has possibly missed some dose of Sinemet, which may have contributed partially due to withdrawal.   Patient received IV Ativan, IV Haldol, IM Zyprexa in ED.  Still not a candidate for inpatient psych admission according to psych team Patient remains on Sinemet for his Parkinson's. Remains on Lexapro trazodone and Seroquel as per psychiatry. 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   Continue outpatient ENT follow-up   Acute urinary retention A Foley catheter is noted.  Placed on 1/8 for urinary retention.  No known history of BPH.    Voiding trial can be attempted when patient is more ambulatory. Flomax was considered however patient has severe allergic reaction to sulfa medications including anaphylaxis. Continue to monitor urine output closely     DVT prophylaxis: SQ Lovenox Code Status: Full Family Communication: No family at bedside Disposition Plan: SNF is recommended     Consultants:  Psychiatry   Procedures:  None   Antimicrobials: None      Subjective: Patient seen in his  room at bedside this morning Mental status still altered Currently out of mittens    Physical Exam: General appearance: Awake alert.  In no distress.  Hard of hearing.  Distracted Resp: Clear to auscultation bilaterally.  Normal effort Cardio: S1-S2 is normal regular.  No S3-S4.  No rubs murmurs or bruit GI: Abdomen is soft.  Nontender nondistended.  Bowel sounds are present normal.  No masses organomegaly    Data Reviewed:    Latest Ref Rng & Units 07/14/2023    8:01 AM 07/13/2023    4:43 AM 07/12/2023    5:16 AM  BMP  Glucose 70 - 99 mg/dL 99  97  638   BUN 8 - 23 mg/dL 12  16  20    Creatinine 0.61 - 1.24 mg/dL 7.56  4.33  2.95   Sodium 135 - 145 mmol/L 145  142  140   Potassium 3.5 - 5.1 mmol/L 3.7  3.8  3.5   Chloride 98 - 111 mmol/L 105  104  103   CO2 22 - 32 mmol/L 28  26  26    Calcium 8.9 - 10.3 mg/dL 9.3  9.0  9.2     Vitals:   07/13/23 0822 07/13/23 2005 07/14/23 0808 07/14/23 1539  BP: (!) 146/70 (!) 142/79 138/73 133/77  Pulse: 90 92 85 95  Resp: 18 16 20 20   Temp:  98.4 F (36.9 C) (!) 97 F (36.1 C) 97.6 F (36.4 C)  TempSrc:      SpO2: 95% 98% 95% 91%  Weight:      Height:  Latest Ref Rng & Units 07/14/2023    8:01 AM 07/13/2023    4:43 AM 07/12/2023    5:16 AM  CBC  WBC 4.0 - 10.5 K/uL 5.7  5.8  7.0   Hemoglobin 13.0 - 17.0 g/dL 13.0  86.5  78.4   Hematocrit 39.0 - 52.0 % 48.9  45.2  47.9   Platelets 150 - 400 K/uL 318  311  328      Author: Loyce Dys, MD 07/14/2023 5:28 PM  For on call review [email or web address].

## 2023-07-14 NOTE — Consult Note (Signed)
[facility] Psychiatric Consult Follow-up  Patient Name: .Justin Bird  MRN: [id number]  DOB: September 15, 1940  Consult Order details:  Orders (From admission, onward)     Start     Ordered   07/04/23 1329  IP CONSULT TO PSYCHIATRY       Ordering Provider: Gillis Santa, MD  Provider:  (Not yet assigned)  Question Answer Comment  Location [facility]   Reason for Consult? Agitation, AMS      07/04/23 1328             Mode of Visit: In person    Psychiatry Consult Evaluation  Service Date: July 14, 2023 LOS:  LOS: 14 days  Chief Complaint confusion/agitation/encephalopathy  Primary Psychiatric Diagnoses  Delirium due to multiple etiologies- metabolic encephalopathy  Assessment  Justin Bird is a 83 y.o. male w/ history of hard of hearing, depression, anxiety, parkinsons, who was medically admitted on 06/29/2023 w/ concerns for acute metabolic encephalopathy.  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  He is unable to even follow written instructions.  His agitation is mainly due to confusion, altered sensorium and unable to communicate.  Optimizing the dosage of Seroquel and added Depakote sprinkles for further mood stabilization.  Will recommend to monitor EKG for prolonged QTc interval given the utilization of Geodon IM as as needed.   Diagnoses:  Active Hospital problems: Principal Problem:   Acute metabolic encephalopathy Active Problems:   Parkinson's disease (HCC)    Plan   ## Psychiatric Medication Recommendations:   Seroquel 150mg  oral 3 times daily -continue trazodone 150mg  oral daily at bedtime -continue geodon 10mg  IM every 4 hours PRN agitation -continue lexapro 5mg  oral daily Depakote sprinkles 250mg  BID- labs reviewed 06/29/23- LFTs WNL  ## Medical Decision Making Capacity:  Not specifically addressed in this encounter   ## Further Work-up:  -- defer further  work-up to hospitalist -- repeat EKG ordered   ## Disposition:-- Pt does not meet criteria for inpatient psychiatric admission   ## Behavioral / Environmental: -Delirium Precautions: Delirium Interventions for Nursing and Staff: - RN to open blinds every AM. - To Bedside: Glasses, hearing aide, and pt's own shoes. Make available to patients. when possible and encourage use. - Encourage po fluids when appropriate, keep fluids within reach. - OOB to chair with meals. - Passive ROM exercises to all extremities with AM & PM care. - RN to assess orientation to person, time and place QAM and PRN. - Recommend extended visitation hours with familiar family/friends as feasible. - Staff to minimize disturbances at night. Turn off television when pt asleep or when not in use.              ## Safety and Observation Level:  - Based on my clinical evaluation, I estimate the patient to be at NO risk of self harm in the current setting. - At this time, we recommend  routine. This decision is based on my review of the chart including patient's history and current presentation, interview of the patient, mental status examination, and consideration of suicide risk including evaluating suicidal ideation, plan, intent, suicidal or self-harm behaviors, risk factors, and protective factors. This judgment is based on our ability to directly address suicide risk, implement suicide prevention strategies, and develop a safety plan while the patient is in the clinical setting. Please contact our team if there is a concern that risk level has changed.   CSSR Risk Category:C-SSRS  RISK CATEGORY: No Risk   Suicide Risk Assessment: Patient has following non-modifiable or demographic risk factors for suicide: male gender Patient has the following protective factors against suicide: Supportive family   Thank you for this consult request. Recommendations have been communicated to the primary team.  We will continue to follow at this  time.   Verner Chol, MD           History of Present Illness  Patient Report: Is noted to be resting in his bed.  He he saw the provider and asked what the provider needs.  Provider asked how he is doing but patient is unable to hear due to cochlear implant failure.  Provider wrote the question "what is your name "and a piece of paper and patient was unable to read.  Patient remains confused and unable to follow even written instructions.  Mittens were removed.  Patient did not escalate or offer any other complaints during the interview.  Psych ROS:  Depression: Unable to assess Anxiety: Unable to assess Mania (lifetime and current): Unable to assess Psychosis: (lifetime and current): Unable to assess   Psychiatric and Social History  Psychiatric History:  Prev Dx/Sx: Per chart review, history of depression/anxiety Previous Med Trials: Per chart review, had been on lexapro previously Prior Psych Hospitalization: Do not see history of psych hospitalization in chart   Exam Findings  Physical Exam: viewed and agree with the medical examination and physical exam conducted by the medical provider/hospitalist. Vital Signs:  Temp:  [97 F (36.1 C)-98.4 F (36.9 C)] 97 F (36.1 C) (01/17 0808) Pulse Rate:  [85-92] 85 (01/17 0808) Resp:  [16-20] 20 (01/17 0808) BP: (138-142)/(73-79) 138/73 (01/17 0808) SpO2:  [95 %-98 %] 95 % (01/17 0808) Blood pressure 138/73, pulse 85, temperature (!) 97 F (36.1 C), resp. rate 20, height 5\' 5"  (1.651 m), weight 71.7 kg, SpO2 95%. Body mass index is 26.3 kg/m.  Physical Exam  Mental Status Exam: General Appearance: Fairly Groomed  Orientation:  NA  Memory:  Negative  Concentration:  Concentration: Poor  Recall:  Poor  Attention  Poor  Eye Contact:  Fair  Speech:  Negative  Language:  Poor  Volume:  Decreased  Mood: what you want  Affect:  Labile  Thought Process:  Disorganized  Thought Content:  Rumination  Suicidal Thoughts:    unable to assess as patient confused  Homicidal Thoughts:   unable to assess as patient confused  Judgement:  Poor  Insight:  Lacking  Psychomotor Activity:  Restlessness  Akathisia:  NA  Fund of Knowledge:  Poor      Assets:  Others:  unable to assess  Cognition:  Impaired,  Severe  ADL's:  Impaired  AIMS (if indicated):        Other History   These have been pulled in through the EMR, reviewed, and updated if appropriate.  Family History:  The patient's family history includes Lung cancer in his mother.  Medical History: Past Medical History:  Diagnosis Date   Allergy    Hearing deficit    Parkinson's disease [facility])     Surgical History: Past Surgical History:  Procedure Laterality Date   COCHLEAR IMPLANT Left    PARTIAL COLECTOMY       Medications:   Current Facility-Administered Medications:    acetaminophen (TYLENOL) tablet 650 mg, 650 mg, Oral, Q6H PRN, Lorretta Harp, MD, 650 mg at 07/08/23 2134   [COMPLETED] bisacodyl (DULCOLAX) EC tablet 10 mg, 10 mg, Oral, BID, 10  mg at 07/06/23 2122 **FOLLOWED BY** bisacodyl (DULCOLAX) EC tablet 10 mg, 10 mg, Oral, QHS, Gillis Santa, MD, 10 mg at 07/13/23 2107   bisacodyl (DULCOLAX) suppository 10 mg, 10 mg, Rectal, Daily PRN, Gillis Santa, MD, 10 mg at 07/13/23 0116   carbidopa-levodopa (SINEMET IR) 25-100 MG per tablet immediate release 2 tablet, 2 tablet, Oral, TID, Phineas Semen, MD, 2 tablet at 07/14/23 6045   cyanocobalamin (VITAMIN B12) tablet 1,000 mcg, 1,000 mcg, Oral, Daily, Lorretta Harp, MD, 1,000 mcg at 07/14/23 4098   divalproex (DEPAKOTE SPRINKLE) capsule 250 mg, 250 mg, Oral, Q12H, Verner Chol, MD   doxazosin (CARDURA) tablet 1 mg, 1 mg, Oral, Daily, Gillis Santa, MD, 1 mg at 07/14/23 0917   enoxaparin (LOVENOX) injection 40 mg, 40 mg, Subcutaneous, Q24H, Lorretta Harp, MD, 40 mg at 07/13/23 2106   escitalopram (LEXAPRO) tablet 5 mg, 5 mg, Oral, Daily, Lauree Chandler, NP, 5 mg at 07/14/23 1191   feeding  supplement (ENSURE ENLIVE / ENSURE PLUS) liquid 237 mL, 237 mL, Oral, BID BM, Gillis Santa, MD, 237 mL at 07/14/23 4782   hydrALAZINE (APRESOLINE) injection 5 mg, 5 mg, Intravenous, Q2H PRN, Lorretta Harp, MD   latanoprost (XALATAN) 0.005 % ophthalmic solution 1 drop, 1 drop, Both Eyes, QHS, Niu, Brien Few, MD, 1 drop at 07/13/23 2107   ondansetron (ZOFRAN) injection 4 mg, 4 mg, Intravenous, Q8H PRN, Lorretta Harp, MD   polyethylene glycol (MIRALAX / GLYCOLAX) packet 17 g, 17 g, Oral, BID, Gillis Santa, MD, 17 g at 07/14/23 9562   polyvinyl alcohol (LIQUIFILM TEARS) 1.4 % ophthalmic solution 2 drop, 2 drop, Both Eyes, PRN, Sreenath, Sudheer B, MD   QUEtiapine (SEROQUEL) tablet 150 mg, 150 mg, Oral, TID, Verner Chol, MD, 150 mg at 07/14/23 1308   traZODone (DESYREL) tablet 150 mg, 150 mg, Oral, QHS, Sreenath, Sudheer B, MD, 150 mg at 07/13/23 2108   ziprasidone (GEODON) injection 10 mg, 10 mg, Intramuscular, Q4H PRN, Lolita Patella B, MD, 10 mg at 07/12/23 2135  Allergies: Allergies  Allergen Reactions   Opium Poppy [Papaver] Anaphylaxis and Hives   Penicillins Anaphylaxis and Rash   Sulfa Antibiotics Anaphylaxis   Penicillins    Sulfa Antibiotics     Verner Chol, MD

## 2023-07-14 NOTE — Plan of Care (Addendum)
Patient is alert and disoriented X 4. Pt is impulsive and try to get out of bed multiple times but it was redirectable. He was able to follow some commands. Tele sitter at bedside.plan of care ongoing.     Problem: Safety: Goal: Non-violent Restraint(s) Outcome: Progressing   Problem: Education: Goal: Knowledge of General Education information will improve Description: Including pain rating scale, medication(s)/side effects and non-pharmacologic comfort measures Outcome: Progressing   Problem: Health Behavior/Discharge Planning: Goal: Ability to manage health-related needs will improve Outcome: Progressing   Problem: Clinical Measurements: Goal: Ability to maintain clinical measurements within normal limits will improve Outcome: Progressing Goal: Will remain free from infection Outcome: Progressing Goal: Diagnostic test results will improve Outcome: Progressing Goal: Respiratory complications will improve Outcome: Progressing Goal: Cardiovascular complication will be avoided Outcome: Progressing   Problem: Activity: Goal: Risk for activity intolerance will decrease Outcome: Progressing   Problem: Nutrition: Goal: Adequate nutrition will be maintained Outcome: Progressing   Problem: Coping: Goal: Level of anxiety will decrease Outcome: Progressing   Problem: Elimination: Goal: Will not experience complications related to bowel motility Outcome: Progressing Goal: Will not experience complications related to urinary retention Outcome: Progressing   Problem: Pain Management: Goal: General experience of comfort will improve Outcome: Progressing   Problem: Safety: Goal: Ability to remain free from injury will improve Outcome: Progressing   Problem: Skin Integrity: Goal: Risk for impaired skin integrity will decrease Outcome: Progressing

## 2023-07-15 DIAGNOSIS — G9341 Metabolic encephalopathy: Secondary | ICD-10-CM | POA: Diagnosis not present

## 2023-07-15 DIAGNOSIS — F05 Delirium due to known physiological condition: Secondary | ICD-10-CM | POA: Diagnosis not present

## 2023-07-15 LAB — CBC WITH DIFFERENTIAL/PLATELET
Abs Immature Granulocytes: 0.02 10*3/uL (ref 0.00–0.07)
Basophils Absolute: 0.1 10*3/uL (ref 0.0–0.1)
Basophils Relative: 1 %
Eosinophils Absolute: 0.3 10*3/uL (ref 0.0–0.5)
Eosinophils Relative: 4 %
HCT: 46 % (ref 39.0–52.0)
Hemoglobin: 15.6 g/dL (ref 13.0–17.0)
Immature Granulocytes: 0 %
Lymphocytes Relative: 29 %
Lymphs Abs: 1.8 10*3/uL (ref 0.7–4.0)
MCH: 30.2 pg (ref 26.0–34.0)
MCHC: 33.9 g/dL (ref 30.0–36.0)
MCV: 89 fL (ref 80.0–100.0)
Monocytes Absolute: 0.7 10*3/uL (ref 0.1–1.0)
Monocytes Relative: 11 %
Neutro Abs: 3.4 10*3/uL (ref 1.7–7.7)
Neutrophils Relative %: 55 %
Platelets: 300 10*3/uL (ref 150–400)
RBC: 5.17 MIL/uL (ref 4.22–5.81)
RDW: 12.6 % (ref 11.5–15.5)
WBC: 6.2 10*3/uL (ref 4.0–10.5)
nRBC: 0 % (ref 0.0–0.2)

## 2023-07-15 LAB — BASIC METABOLIC PANEL
Anion gap: 9 (ref 5–15)
BUN: 13 mg/dL (ref 8–23)
CO2: 28 mmol/L (ref 22–32)
Calcium: 9.2 mg/dL (ref 8.9–10.3)
Chloride: 105 mmol/L (ref 98–111)
Creatinine, Ser: 0.62 mg/dL (ref 0.61–1.24)
GFR, Estimated: >60 mL/min (ref >60)
Glucose, Bld: 108 mg/dL — ABNORMAL HIGH (ref 70–99)
Potassium: 3.5 mmol/L (ref 3.5–5.1)
Sodium: 142 mmol/L (ref 135–145)

## 2023-07-15 NOTE — Plan of Care (Signed)

## 2023-07-15 NOTE — Progress Notes (Signed)
Progress Note   Patient: Justin Bird FAO:[id number] DOB: Nov 17, 1940 DOA: 06/29/2023     15 DOS: the patient was seen and examined on 07/15/2023      Brief Narrative:  83 y.o. male with medical history significant of hard of hearing, depression, Parkinson's disease, allergy, who presents with altered mental status.   Assessment & Plan:   Acute metabolic encephalopathy:  Etiology is not clear.  CT head negative for acute intracranial abnormalities.  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   Potential differential diagnosis to include early stage of dementia, delirium B12 and TSH within normal limits.   Patient has possibly missed some dose of Sinemet, which may have contributed partially due to withdrawal.   Patient received IV Ativan, IV Haldol, IM Zyprexa in ED.  Still not a candidate for inpatient psych admission according to psych team Patient remains on Sinemet for his Parkinson's. Remains on Lexapro trazodone and Seroquel as per psychiatry. 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   Continue outpatient ENT follow-up   Acute urinary retention A Foley catheter is noted.  Placed on 1/8 for urinary retention.  No known history of BPH.    Voiding trial can be attempted when patient is more ambulatory. Flomax was considered however patient has severe allergic reaction to sulfa medications including anaphylaxis. Continue to monitor urine output closely     DVT prophylaxis: SQ Lovenox Code Status: Full Family Communication: No family at bedside Disposition Plan: SNF is recommended     Consultants:  Psychiatry   Procedures:  None   Antimicrobials: None      Subjective: Patient seen in his  room at bedside this morning Mental status improving however still having confusion   Physical Exam: General appearance: Awake alert.  In no distress.  Hard of hearing.  Distracted Resp: Clear to auscultation bilaterally.  Normal effort Cardio: S1-S2 is normal regular.  No S3-S4.  No rubs murmurs or bruit GI: Abdomen is soft.  Nontender nondistended.  Bowel sounds are present normal.  No masses organomegaly    Data Reviewed:    Latest Ref Rng & Units 07/15/2023    3:47 AM 07/14/2023    8:01 AM 07/13/2023    4:43 AM  BMP  Glucose 70 - 99 mg/dL 696  99  97   BUN 8 - 23 mg/dL 13  12  16    Creatinine 0.61 - 1.24 mg/dL 2.95  2.84  1.32   Sodium 135 - 145 mmol/L 142  145  142   Potassium 3.5 - 5.1 mmol/L 3.5  3.7  3.8   Chloride 98 - 111 mmol/L 105  105  104   CO2 22 - 32 mmol/L 28  28  26    Calcium 8.9 - 10.3 mg/dL 9.2  9.3  9.0     Vitals:   07/14/23 1539 07/15/23 0019 07/15/23 0742 07/15/23 0927  BP: 133/77 121/70 135/73 114/61  Pulse: 95 76 74 81  Resp: 20 18 15    Temp: 97.6 F (36.4 C) 97.9 F (36.6 C) 97.7 F (36.5 C)   TempSrc:      SpO2: 91% 93% 98%   Weight:      Height:          Latest Ref  Rng & Units 07/15/2023    3:47 AM 07/14/2023    8:01 AM 07/13/2023    4:43 AM  CBC  WBC 4.0 - 10.5 K/uL 6.2  5.7  5.8   Hemoglobin 13.0 - 17.0 g/dL 16.1  09.6  04.5   Hematocrit 39.0 - 52.0 % 46.0  48.9  45.2   Platelets 150 - 400 K/uL 300  318  311      Author: Loyce Dys, MD 07/15/2023 2:08 PM  For on call review [email or web address].

## 2023-07-15 NOTE — Consult Note (Signed)
[facility] Psychiatric Consult Follow-up  Patient Name: .Justin Bird  MRN: [id number]  DOB: 04-06-1941  Consult Order details:  Orders (From admission, onward)     Start     Ordered   07/04/23 1329  IP CONSULT TO PSYCHIATRY       Ordering Provider: Gillis Santa, MD  Provider:  (Not yet assigned)  Question Answer Comment  [facility]   Reason for Consult? Agitation, AMS      07/04/23 1328             Mode of Visit: In person    Psychiatry Consult Evaluation  Service Date: July 15, 2023 LOS:  LOS: 15 days  Chief Complaint confusion/agitation/encephalopathy  Primary Psychiatric Diagnoses  Delirium due to multiple etiologies- metabolic encephalopathy  Assessment  Justin Bird is a 83 y.o. male w/ history of hard of hearing, depression, anxiety, parkinsons, who was medically admitted on 06/29/2023 w/ concerns for acute metabolic encephalopathy.  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  He is unable to even follow written instructions.  His agitation is mainly due to confusion, altered sensorium and unable to communicate.  Optimizing the dosage of Seroquel and added Depakote sprinkles for further mood stabilization.  Will recommend to monitor EKG for prolonged QTc interval given the utilization of Geodon IM as as needed.   Diagnoses:  Active Hospital problems: Principal Problem:   Acute metabolic encephalopathy Active Problems:   Parkinson's disease (HCC)    Plan   ## Psychiatric Medication Recommendations:   Seroquel 150mg  oral 3 times daily -continue trazodone 150mg  oral daily at bedtime -continue geodon 10mg  IM every 4 hours PRN agitation -continue lexapro 5mg  oral daily Depakote sprinkles 250mg  BID- labs reviewed 06/29/23- LFTs WNL  ## Medical Decision Making Capacity:  Not specifically addressed in this encounter   ## Further Work-up:  -- defer further  work-up to hospitalist -- repeat EKG ordered   ## Disposition:-- Pt does not meet criteria for inpatient psychiatric admission   ## Behavioral / Environmental: -Delirium Precautions: Delirium Interventions for Nursing and Staff: - RN to open blinds every AM. - To Bedside: Glasses, hearing aide, and pt's own shoes. Make available to patients. when possible and encourage use. - Encourage po fluids when appropriate, keep fluids within reach. - OOB to chair with meals. - Passive ROM exercises to all extremities with AM & PM care. - RN to assess orientation to person, time and place QAM and PRN. - Recommend extended visitation hours with familiar family/friends as feasible. - Staff to minimize disturbances at night. Turn off television when pt asleep or when not in use.              ## Safety and Observation Level:  - Based on my clinical evaluation, I estimate the patient to be at NO risk of self harm in the current setting. - At this time, we recommend  routine. This decision is based on my review of the chart including patient's history and current presentation, interview of the patient, mental status examination, and consideration of suicide risk including evaluating suicidal ideation, plan, intent, suicidal or self-harm behaviors, risk factors, and protective factors. This judgment is based on our ability to directly address suicide risk, implement suicide prevention strategies, and develop a safety plan while the patient is in the clinical setting. Please contact our team if there is a concern that risk level has changed.   CSSR Risk Category:C-SSRS  RISK CATEGORY: No Risk   Suicide Risk Assessment: Patient has following non-modifiable or demographic risk factors for suicide: male gender Patient has the following protective factors against suicide: Supportive family   Thank you for this consult request. Recommendations have been communicated to the primary team.  We will continue to follow at this  time.   Verner Chol, MD           History of Present Illness  Patient Report: Today patient is noted to be resting in bed.  He was naked with all his close around his bed.  Provider used written statements and sign language to communicate but patient was confused and unable to follow any kind of commands.  Due to his hard of hearing he is unable to hear any statements.  Per nursing report patient continues to be very confused but he is able to eat some of his food today.  Since morning he has not been agitated and is taking his medications with no problems. Psych ROS:  Depression: Unable to assess Anxiety: Unable to assess Mania (lifetime and current): Unable to assess Psychosis: (lifetime and current): Unable to assess   Psychiatric and Social History  Psychiatric History:  Prev Dx/Sx: Per chart review, history of depression/anxiety Previous Med Trials: Per chart review, had been on lexapro previously Prior Psych Hospitalization: Do not see history of psych hospitalization in chart   Exam Findings  Physical Exam: viewed and agree with the medical examination and physical exam conducted by the medical provider/hospitalist. Vital Signs:  Temp:  [97.6 F (36.4 C)-97.9 F (36.6 C)] 97.7 F (36.5 C) (01/18 0742) Pulse Rate:  [74-95] 81 (01/18 0927) Resp:  [15-20] 15 (01/18 0742) BP: (114-135)/(61-77) 114/61 (01/18 0927) SpO2:  [91 %-98 %] 98 % (01/18 0742) Blood pressure 114/61, pulse 81, temperature 97.7 F (36.5 C), resp. rate 15, height 5\' 5"  (1.651 m), weight 71.7 kg, SpO2 98%. Body mass index is 26.3 kg/m.    Mental Status Exam: General Appearance: Fairly Groomed  Orientation:  NA  Memory:  Negative  Concentration:  Concentration: Poor  Recall:  Poor  Attention  Poor  Eye Contact:  Fair  Speech:  Negative  Language:  Poor  Volume:  Decreased  Mood: what you want  Affect:  Labile  Thought Process:  Disorganized  Thought Content:  Rumination  Suicidal Thoughts:    unable to assess as patient confused  Homicidal Thoughts:   unable to assess as patient confused  Judgement:  Poor  Insight:  Lacking  Psychomotor Activity:  Restlessness  Akathisia:  NA  Fund of Knowledge:  Poor      Assets:  Others:  unable to assess  Cognition:  Impaired,  Severe  ADL's:  Impaired  AIMS (if indicated):        Other History   These have been pulled in through the EMR, reviewed, and updated if appropriate.  Family History:  The patient's family history includes Lung cancer in his mother.  Medical History: Past Medical History:  Diagnosis Date   Allergy    Hearing deficit    Parkinson's disease [facility])     Surgical History: Past Surgical History:  Procedure Laterality Date   COCHLEAR IMPLANT Left    PARTIAL COLECTOMY       Medications:   Current Facility-Administered Medications:    acetaminophen (TYLENOL) tablet 650 mg, 650 mg, Oral, Q6H PRN, Lorretta Harp, MD, 650 mg at 07/08/23 2134   [COMPLETED] bisacodyl (DULCOLAX) EC tablet 10 mg, 10  mg, Oral, BID, 10 mg at 07/06/23 2122 **FOLLOWED BY** bisacodyl (DULCOLAX) EC tablet 10 mg, 10 mg, Oral, QHS, Gillis Santa, MD, 10 mg at 07/14/23 2046   bisacodyl (DULCOLAX) suppository 10 mg, 10 mg, Rectal, Daily PRN, Gillis Santa, MD, 10 mg at 07/13/23 0116   carbidopa-levodopa (SINEMET IR) 25-100 MG per tablet immediate release 2 tablet, 2 tablet, Oral, TID, Phineas Semen, MD, 2 tablet at 07/15/23 5284   cyanocobalamin (VITAMIN B12) tablet 1,000 mcg, 1,000 mcg, Oral, Daily, Lorretta Harp, MD, 1,000 mcg at 07/15/23 1324   divalproex (DEPAKOTE SPRINKLE) capsule 250 mg, 250 mg, Oral, Q12H, Verner Chol, MD, 250 mg at 07/15/23 4010   doxazosin (CARDURA) tablet 1 mg, 1 mg, Oral, Daily, Gillis Santa, MD, 1 mg at 07/15/23 0928   enoxaparin (LOVENOX) injection 40 mg, 40 mg, Subcutaneous, Q24H, Lorretta Harp, MD, 40 mg at 07/14/23 2044   escitalopram (LEXAPRO) tablet 5 mg, 5 mg, Oral, Daily, Lauree Chandler, NP, 5 mg at  07/15/23 2725   feeding supplement (ENSURE ENLIVE / ENSURE PLUS) liquid 237 mL, 237 mL, Oral, BID BM, Gillis Santa, MD, 237 mL at 07/15/23 0929   hydrALAZINE (APRESOLINE) injection 5 mg, 5 mg, Intravenous, Q2H PRN, Lorretta Harp, MD   latanoprost (XALATAN) 0.005 % ophthalmic solution 1 drop, 1 drop, Both Eyes, QHS, Niu, Brien Few, MD, 1 drop at 07/13/23 2107   ondansetron (ZOFRAN) injection 4 mg, 4 mg, Intravenous, Q8H PRN, Lorretta Harp, MD   polyethylene glycol (MIRALAX / GLYCOLAX) packet 17 g, 17 g, Oral, BID, Gillis Santa, MD, 17 g at 07/15/23 3664   polyvinyl alcohol (LIQUIFILM TEARS) 1.4 % ophthalmic solution 2 drop, 2 drop, Both Eyes, PRN, Sreenath, Sudheer B, MD   QUEtiapine (SEROQUEL) tablet 150 mg, 150 mg, Oral, TID, Verner Chol, MD, 150 mg at 07/15/23 4034   traZODone (DESYREL) tablet 150 mg, 150 mg, Oral, QHS, Sreenath, Sudheer B, MD, 150 mg at 07/14/23 2045   ziprasidone (GEODON) injection 10 mg, 10 mg, Intramuscular, Q4H PRN, Lolita Patella B, MD, 10 mg at 07/14/23 2134  Allergies: Allergies  Allergen Reactions   Opium Poppy [Papaver] Anaphylaxis and Hives   Penicillins Anaphylaxis and Rash   Sulfa Antibiotics Anaphylaxis   Penicillins    Sulfa Antibiotics     Verner Chol, MD

## 2023-07-16 DIAGNOSIS — G9341 Metabolic encephalopathy: Secondary | ICD-10-CM | POA: Diagnosis not present

## 2023-07-16 DIAGNOSIS — F05 Delirium due to known physiological condition: Secondary | ICD-10-CM | POA: Diagnosis not present

## 2023-07-16 LAB — CBC WITH DIFFERENTIAL/PLATELET
Abs Immature Granulocytes: 0.01 10*3/uL (ref 0.00–0.07)
Basophils Absolute: 0 10*3/uL (ref 0.0–0.1)
Basophils Relative: 1 %
Eosinophils Absolute: 0.2 10*3/uL (ref 0.0–0.5)
Eosinophils Relative: 4 %
HCT: 48 % (ref 39.0–52.0)
Hemoglobin: 15.7 g/dL (ref 13.0–17.0)
Immature Granulocytes: 0 %
Lymphocytes Relative: 23 %
Lymphs Abs: 1.4 10*3/uL (ref 0.7–4.0)
MCH: 29.8 pg (ref 26.0–34.0)
MCHC: 32.7 g/dL (ref 30.0–36.0)
MCV: 91.3 fL (ref 80.0–100.0)
Monocytes Absolute: 0.6 10*3/uL (ref 0.1–1.0)
Monocytes Relative: 10 %
Neutro Abs: 3.8 10*3/uL (ref 1.7–7.7)
Neutrophils Relative %: 62 %
Platelets: 295 10*3/uL (ref 150–400)
RBC: 5.26 MIL/uL (ref 4.22–5.81)
RDW: 12.7 % (ref 11.5–15.5)
WBC: 6.1 10*3/uL (ref 4.0–10.5)
nRBC: 0 % (ref 0.0–0.2)

## 2023-07-16 MED ORDER — QUETIAPINE FUMARATE 100 MG PO TABS
150.0000 mg | ORAL_TABLET | Freq: Three times a day (TID) | ORAL | Status: DC
  Administered 2023-07-16 – 2023-07-29 (×38): 150 mg via ORAL
  Filled 2023-07-16: qty 6
  Filled 2023-07-16: qty 1.5
  Filled 2023-07-16 (×2): qty 6
  Filled 2023-07-16 (×2): qty 1.5
  Filled 2023-07-16 (×2): qty 6
  Filled 2023-07-16 (×3): qty 1.5
  Filled 2023-07-16: qty 6
  Filled 2023-07-16 (×4): qty 1.5
  Filled 2023-07-16: qty 6
  Filled 2023-07-16 (×8): qty 1.5
  Filled 2023-07-16 (×2): qty 6
  Filled 2023-07-16: qty 1.5
  Filled 2023-07-16 (×2): qty 6
  Filled 2023-07-16: qty 1.5
  Filled 2023-07-16: qty 6
  Filled 2023-07-16 (×3): qty 1.5
  Filled 2023-07-16 (×2): qty 6
  Filled 2023-07-16 (×2): qty 1.5
  Filled 2023-07-16: qty 6
  Filled 2023-07-16: qty 1.5
  Filled 2023-07-16: qty 6
  Filled 2023-07-16: qty 1.5
  Filled 2023-07-16 (×3): qty 6
  Filled 2023-07-16: qty 1.5
  Filled 2023-07-16 (×2): qty 6
  Filled 2023-07-16 (×4): qty 1.5
  Filled 2023-07-16 (×2): qty 6
  Filled 2023-07-16 (×3): qty 1.5
  Filled 2023-07-16 (×3): qty 6
  Filled 2023-07-16 (×2): qty 1.5
  Filled 2023-07-16: qty 6
  Filled 2023-07-16 (×3): qty 1.5
  Filled 2023-07-16: qty 6
  Filled 2023-07-16: qty 1.5
  Filled 2023-07-16: qty 6
  Filled 2023-07-16: qty 1.5
  Filled 2023-07-16: qty 6
  Filled 2023-07-16: qty 1.5

## 2023-07-16 NOTE — Plan of Care (Signed)

## 2023-07-16 NOTE — Consult Note (Signed)
[facility] Psychiatric Consult Follow-up  Patient Name: .Justin Bird  MRN: [id number]  DOB: [birth year]  Consult Order details:  Orders (From admission, onward)     Start     Ordered   07/04/23 1329  IP CONSULT TO PSYCHIATRY       Ordering Provider: Gillis Santa, MD  Provider:  (Not yet assigned)  Question Answer Comment  Location [facility]   Reason for Consult? Agitation, AMS      07/04/23 1328             Mode of Visit: In person    Psychiatry Consult Evaluation  Service Date: July 16, 2023 LOS:  LOS: 16 days  Chief Complaint confusion/agitation/encephalopathy  Primary Psychiatric Diagnoses  Delirium due to multiple etiologies- metabolic encephalopathy  Assessment  Justin Bird is a 83 y.o. male w/ history of hard of hearing, depression, anxiety, parkinsons, who was medically admitted on 06/29/2023 w/ concerns for acute metabolic encephalopathy.  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  He is unable to even follow written instructions.  His agitation is mainly due to confusion, altered sensorium and unable to communicate.  Optimizing the dosage of Seroquel and added Depakote sprinkles for further mood stabilization.  Will recommend to monitor EKG for prolonged QTc interval given the utilization of Geodon IM as as needed.   Diagnoses:  Active Hospital problems: Principal Problem:   Acute metabolic encephalopathy Active Problems:   Parkinson's disease (HCC)    Plan   ## Psychiatric Medication Recommendations:   Seroquel 150mg  oral 3 times daily -continue trazodone 150mg  oral daily at bedtime -continue geodon 10mg  IM every 4 hours PRN agitation -continue lexapro 5mg  oral daily Depakote sprinkles 250mg  BID- labs reviewed 06/29/23- LFTs WNL  ## Medical Decision Making Capacity:  Not specifically addressed in this encounter   ## Further Work-up:  -- defer further  work-up to hospitalist -- repeat EKG ordered   ## Disposition:-- Pt does not meet criteria for inpatient psychiatric admission   ## Behavioral / Environmental: -Delirium Precautions: Delirium Interventions for Nursing and Staff: - RN to open blinds every AM. - To Bedside: Glasses, hearing aide, and pt's own shoes. Make available to patients. when possible and encourage use. - Encourage po fluids when appropriate, keep fluids within reach. - OOB to chair with meals. - Passive ROM exercises to all extremities with AM & PM care. - RN to assess orientation to person, time and place QAM and PRN. - Recommend extended visitation hours with familiar family/friends as feasible. - Staff to minimize disturbances at night. Turn off television when pt asleep or when not in use.              ## Safety and Observation Level:  - Based on my clinical evaluation, I estimate the patient to be at NO risk of self harm in the current setting. - At this time, we recommend  routine. This decision is based on my review of the chart including patient's history and current presentation, interview of the patient, mental status examination, and consideration of suicide risk including evaluating suicidal ideation, plan, intent, suicidal or self-harm behaviors, risk factors, and protective factors. This judgment is based on our ability to directly address suicide risk, implement suicide prevention strategies, and develop a safety plan while the patient is in the clinical setting. Please contact our team if there is a concern that risk level has changed.   CSSR Risk Category:C-SSRS  RISK CATEGORY: No Risk   Suicide Risk Assessment: Patient has following non-modifiable or demographic risk factors for suicide: male gender Patient has the following protective factors against suicide: Supportive family   Thank you for this consult request. Recommendations have been communicated to the primary team.  We will continue to follow at this  time.   Verner Chol, MD           History of Present Illness  Patient Report: Today on interview patient is noted to be resting in bed, continue to speak nonsensical.  Continues to remain confused and does not follow any verbal commands or follow sign language or written language.  Per nursing staff patient continues to be confused and restless at times.  He is able to take his medications at this time with no problems.  Provider talked at length with patient's son who is also POA about psychiatric interventions at this time to help with the confusion.  Family expressed their understanding.  Psych ROS:  Depression: Unable to assess Anxiety: Unable to assess Mania (lifetime and current): Unable to assess Psychosis: (lifetime and current): Unable to assess   Psychiatric and Social History  Psychiatric History:  Prev Dx/Sx: Per chart review, history of depression/anxiety Previous Med Trials: Per chart review, had been on lexapro previously Prior Psych Hospitalization: Do not see history of psych hospitalization in chart   Exam Findings  Physical Exam: viewed and agree with the medical examination and physical exam conducted by the medical provider/hospitalist. Vital Signs:  Temp:  [97.4 F (36.3 C)-97.6 F (36.4 C)] 97.4 F (36.3 C) (01/19 0747) Pulse Rate:  [82-86] 82 (01/19 0747) Resp:  [16-18] 18 (01/19 0747) BP: (119-134)/(69-76) 119/69 (01/19 0747) SpO2:  [94 %] 94 % (01/19 0747) Blood pressure 119/69, pulse 82, temperature (!) 97.4 F (36.3 C), temperature source Oral, resp. rate 18, height 5\' 5"  (1.651 m), weight 71.7 kg, SpO2 94%. Body mass index is 26.3 kg/m.    Mental Status Exam: General Appearance: Fairly Groomed  Orientation:  NA  Memory:  Negative  Concentration:  Concentration: Poor  Recall:  Poor  Attention  Poor  Eye Contact:  Fair  Speech:  Negative  Language:  Poor  Volume:  Decreased  Mood: what you want  Affect:  Labile  Thought Process:   Disorganized  Thought Content:  Rumination  Suicidal Thoughts:   unable to assess as patient confused  Homicidal Thoughts:   unable to assess as patient confused  Judgement:  Poor  Insight:  Lacking  Psychomotor Activity:  Restlessness  Akathisia:  NA  Fund of Knowledge:  Poor      Assets:  Others:  unable to assess  Cognition:  Impaired,  Severe  ADL's:  Impaired  AIMS (if indicated):        Other History   These have been pulled in through the EMR, reviewed, and updated if appropriate.  Family History:  The patient's family history includes Lung cancer in his mother.  Medical History: Past Medical History:  Diagnosis Date   Allergy    Hearing deficit    Parkinson's disease [facility])     Surgical History: Past Surgical History:  Procedure Laterality Date   COCHLEAR IMPLANT Left    PARTIAL COLECTOMY       Medications:   Current Facility-Administered Medications:    acetaminophen (TYLENOL) tablet 650 mg, 650 mg, Oral, Q6H PRN, Lorretta Harp, MD, 650 mg at 07/08/23 2134   [COMPLETED] bisacodyl (DULCOLAX) EC tablet 10 mg, 10  mg, Oral, BID, 10 mg at 07/06/23 2122 **FOLLOWED BY** bisacodyl (DULCOLAX) EC tablet 10 mg, 10 mg, Oral, QHS, Gillis Santa, MD, 10 mg at 07/16/23 0015   bisacodyl (DULCOLAX) suppository 10 mg, 10 mg, Rectal, Daily PRN, Gillis Santa, MD, 10 mg at 07/13/23 0116   carbidopa-levodopa (SINEMET IR) 25-100 MG per tablet immediate release 2 tablet, 2 tablet, Oral, TID, Phineas Semen, MD, 2 tablet at 07/16/23 1535   cyanocobalamin (VITAMIN B12) tablet 1,000 mcg, 1,000 mcg, Oral, Daily, Lorretta Harp, MD, 1,000 mcg at 07/16/23 5366   divalproex (DEPAKOTE SPRINKLE) capsule 250 mg, 250 mg, Oral, Q12H, Verner Chol, MD, 250 mg at 07/16/23 0902   doxazosin (CARDURA) tablet 1 mg, 1 mg, Oral, Daily, Gillis Santa, MD, 1 mg at 07/16/23 0903   enoxaparin (LOVENOX) injection 40 mg, 40 mg, Subcutaneous, Q24H, Lorretta Harp, MD, 40 mg at 07/16/23 0015   escitalopram (LEXAPRO)  tablet 5 mg, 5 mg, Oral, Daily, Lauree Chandler, NP, 5 mg at 07/16/23 0902   feeding supplement (ENSURE ENLIVE / ENSURE PLUS) liquid 237 mL, 237 mL, Oral, BID BM, Gillis Santa, MD, 237 mL at 07/16/23 4403   hydrALAZINE (APRESOLINE) injection 5 mg, 5 mg, Intravenous, Q2H PRN, Lorretta Harp, MD   latanoprost (XALATAN) 0.005 % ophthalmic solution 1 drop, 1 drop, Both Eyes, QHS, Niu, Brien Few, MD, 1 drop at 07/16/23 0016   ondansetron (ZOFRAN) injection 4 mg, 4 mg, Intravenous, Q8H PRN, Lorretta Harp, MD   polyethylene glycol (MIRALAX / GLYCOLAX) packet 17 g, 17 g, Oral, BID, Gillis Santa, MD, 17 g at 07/16/23 4742   polyvinyl alcohol (LIQUIFILM TEARS) 1.4 % ophthalmic solution 2 drop, 2 drop, Both Eyes, PRN, Sreenath, Sudheer B, MD   QUEtiapine (SEROQUEL) tablet 150 mg, 150 mg, Oral, TID, Rosezetta Schlatter T, MD, 150 mg at 07/16/23 1535   traZODone (DESYREL) tablet 150 mg, 150 mg, Oral, QHS, Sreenath, Sudheer B, MD, 150 mg at 07/16/23 0015   ziprasidone (GEODON) injection 10 mg, 10 mg, Intramuscular, Q4H PRN, Lolita Patella B, MD, 10 mg at 07/14/23 2134  Allergies: Allergies  Allergen Reactions   Opium Poppy [Papaver] Anaphylaxis and Hives   Penicillins Anaphylaxis and Rash   Sulfa Antibiotics Anaphylaxis   Penicillins    Sulfa Antibiotics     Verner Chol, MD

## 2023-07-16 NOTE — Progress Notes (Signed)
Progress Note   Patient: Justin Bird [id number]:[id number] DOB: 1941/04/15 DOA: 06/29/2023     16 DOS: the patient was seen and examined on 07/16/2023   Brief Narrative:  83 y.o. male with medical history significant of hard of hearing, depression, Parkinson's disease, allergy, who presents with altered mental status.   Assessment & Plan:   Acute metabolic encephalopathy:  Etiology is not clear.  CT head negative for acute intracranial abnormalities.  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   Potential differential diagnosis to include early stage of dementia, delirium B12 and TSH within normal limits.   Patient has possibly missed some dose of Sinemet, which may have contributed partially due to withdrawal.   Patient received IV Ativan, IV Haldol, IM Zyprexa in ED.  Still not a candidate for inpatient psych admission according to psych team Patient remains on Sinemet for his Parkinson's. Remains on Lexapro trazodone and Seroquel as per psychiatry. 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   Continue outpatient ENT follow-up   Acute urinary retention A Foley catheter is noted.  Placed on 1/8 for urinary retention.  No known history of BPH.    Voiding trial can be attempted when patient is more ambulatory. Flomax was considered however patient has severe allergic reaction to sulfa medications including anaphylaxis. Continue to monitor urine output closely     DVT prophylaxis: SQ Lovenox Code Status: Full Family Communication: No family at bedside Disposition Plan: SNF is recommended     Consultants:  Psychiatry    Procedures:  None   Antimicrobials: None      Subjective: Patient seen and examined at bedside .  Appears confused obviously worsened by underlying dementia We appreciate the input of psychiatry as patient has associated behavioral issues   Physical Exam: General appearance: Awake alert.  In no distress.  Hard of hearing.  Distracted Resp: Clear to auscultation bilaterally.  Normal effort Cardio: S1-S2 is normal regular.  No S3-S4.  No rubs murmurs or bruit GI: Abdomen is soft.  Nontender nondistended.  Bowel sounds are present normal.  No masses organomegaly    Data Reviewed:    Latest Ref Rng & Units 07/16/2023    4:45 AM 07/15/2023    3:47 AM 07/14/2023    8:01 AM  CBC  WBC 4.0 - 10.5 K/uL 6.1  6.2  5.7   Hemoglobin 13.0 - 17.0 g/dL 37.6  28.3  15.1   Hematocrit 39.0 - 52.0 % 48.0  46.0  48.9   Platelets 150 - 400 K/uL 295  300  318        Latest Ref Rng & Units 07/15/2023    3:47 AM 07/14/2023    8:01 AM 07/13/2023    4:43 AM  BMP  Glucose 70 - 99 mg/dL 761  99  97   BUN 8 - 23 mg/dL 13  12  16    Creatinine 0.61 - 1.24 mg/dL 6.07  3.71  0.62   Sodium 135 - 145 mmol/L 142  145  142   Potassium 3.5 - 5.1 mmol/L 3.5  3.7  3.8   Chloride 98 - 111 mmol/L 105  105  104   CO2 22 -  32 mmol/L 28  28  26    Calcium 8.9 - 10.3 mg/dL 9.2  9.3  9.0     Vitals:   07/15/23 1540 07/15/23 2132 07/16/23 0747 07/16/23 1614  BP: 137/69 134/76 119/69 139/78  Pulse: 81 86 82 87  Resp: 16 16 18 18   Temp:  97.6 F (36.4 C) (!) 97.4 F (36.3 C) 98.6 F (37 C)  TempSrc:  Oral Oral   SpO2: 95% 94% 94% 94%  Weight:      Height:         Author: Loyce Dys, MD 07/16/2023 4:17 PM  For on call review [email or web address].

## 2023-07-17 DIAGNOSIS — G9341 Metabolic encephalopathy: Secondary | ICD-10-CM | POA: Diagnosis not present

## 2023-07-17 LAB — CBC WITH DIFFERENTIAL/PLATELET
Abs Immature Granulocytes: 0.03 10*3/uL (ref 0.00–0.07)
Basophils Absolute: 0 10*3/uL (ref 0.0–0.1)
Basophils Relative: 1 %
Eosinophils Absolute: 0.2 10*3/uL (ref 0.0–0.5)
Eosinophils Relative: 3 %
HCT: 48.1 % (ref 39.0–52.0)
Hemoglobin: 15.8 g/dL (ref 13.0–17.0)
Immature Granulocytes: 0 %
Lymphocytes Relative: 18 %
Lymphs Abs: 1.3 10*3/uL (ref 0.7–4.0)
MCH: 30 pg (ref 26.0–34.0)
MCHC: 32.8 g/dL (ref 30.0–36.0)
MCV: 91.3 fL (ref 80.0–100.0)
Monocytes Absolute: 0.6 10*3/uL (ref 0.1–1.0)
Monocytes Relative: 8 %
Neutro Abs: 5.1 10*3/uL (ref 1.7–7.7)
Neutrophils Relative %: 70 %
Platelets: 312 10*3/uL (ref 150–400)
RBC: 5.27 MIL/uL (ref 4.22–5.81)
RDW: 12.7 % (ref 11.5–15.5)
WBC: 7.2 10*3/uL (ref 4.0–10.5)
nRBC: 0 % (ref 0.0–0.2)

## 2023-07-17 LAB — BASIC METABOLIC PANEL
Anion gap: 9 (ref 5–15)
BUN: 18 mg/dL (ref 8–23)
CO2: 27 mmol/L (ref 22–32)
Calcium: 9 mg/dL (ref 8.9–10.3)
Chloride: 105 mmol/L (ref 98–111)
Creatinine, Ser: 0.63 mg/dL (ref 0.61–1.24)
GFR, Estimated: >60 mL/min (ref >60)
Glucose, Bld: 100 mg/dL — ABNORMAL HIGH (ref 70–99)
Potassium: 3.6 mmol/L (ref 3.5–5.1)
Sodium: 141 mmol/L (ref 135–145)

## 2023-07-17 MED ORDER — CYANOCOBALAMIN 1000 MCG/ML IJ SOLN
1000.0000 ug | Freq: Once | INTRAMUSCULAR | Status: DC
  Filled 2023-07-17: qty 1

## 2023-07-17 NOTE — Progress Notes (Signed)
Progress Note   Patient: Justin Bird ZOX:[id number] DOB: 06-01-1941 DOA: 06/29/2023     17 DOS: the patient was seen and examined on 07/17/2023     Brief Narrative:  83 y.o. male with medical history significant of hard of hearing, depression, Parkinson's disease, allergy, who presents with altered mental status.   Assessment & Plan:   Acute metabolic encephalopathy:  Etiology is not clear.  CT head negative for acute intracranial abnormalities.  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   Potential differential diagnosis to include early stage of dementia, delirium B12 and TSH within normal limits.   Patient has possibly missed some dose of Sinemet, which may have contributed partially due to withdrawal.   Patient received IV Ativan, IV Haldol, IM Zyprexa in ED.  Still not a candidate for inpatient psych admission according to psych team Patient remains on Sinemet for his Parkinson's. Remains on Lexapro trazodone and Seroquel as per psychiatry. 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   Continue outpatient ENT follow-up   Acute urinary retention A Foley catheter is noted.  Placed on 1/8 for urinary retention.  No known history of BPH.    Voiding trial can be attempted when patient is more ambulatory. Flomax was considered however patient has severe allergic reaction to sulfa medications including anaphylaxis. Continue to monitor urine output closely     DVT prophylaxis: SQ Lovenox Code Status: Full Family  Communication: No family at bedside Disposition Plan: SNF is recommended     Consultants:  Psychiatry   Procedures:  None   Antimicrobials: None      Subjective: Patient seen and examined at bedside this morning Patient confused exhibiting symptoms of dementia Palliative care consulted  Physical Exam: General appearance: Awake alert.  In no distress.  Hard of hearing.  Distracted Resp: Clear to auscultation bilaterally.  Normal effort Cardio: S1-S2 is normal regular.  No S3-S4.  No rubs murmurs or bruit GI: Abdomen is soft.  Nontender nondistended.  Bowel sounds are present normal.  No masses organomegaly    Data Reviewed:    Latest Ref Rng & Units 07/17/2023    4:52 AM 07/15/2023    3:47 AM 07/14/2023    8:01 AM  BMP  Glucose 70 - 99 mg/dL 811  914  99   BUN 8 - 23 mg/dL 18  13  12    Creatinine 0.61 - 1.24 mg/dL 7.82  9.56  2.13   Sodium 135 - 145 mmol/L 141  142  145   Potassium 3.5 - 5.1 mmol/L 3.6  3.5  3.7   Chloride 98 - 111 mmol/L 105  105  105   CO2 22 - 32 mmol/L 27  28  28    Calcium 8.9 - 10.3 mg/dL 9.0  9.2  9.3         Latest Ref Rng & Units 07/17/2023    4:52 AM 07/16/2023    4:45 AM 07/15/2023    3:47 AM  CBC  WBC 4.0 - 10.5 K/uL 7.2  6.1  6.2   Hemoglobin 13.0 - 17.0 g/dL 08.6  15.7  15.6   Hematocrit 39.0 - 52.0 % 48.1  48.0  46.0   Platelets 150 - 400 K/uL 312  295  300         Vitals:   07/16/23 1614 07/17/23 0642 07/17/23 0820 07/17/23 1522  BP: 139/78 (!) 154/77 136/76 138/68  Pulse: 87 81 75 94  Resp: 18 16 18 18   Temp: 98.6 F (37 C) (!) 97.5 F (36.4 C) 97.7 F (36.5 C) (!) 97.5 F (36.4 C)  TempSrc:  Oral Oral Oral  SpO2: 94% 100% 96% 99%  Weight:      Height:         Author: Loyce Dys, MD 07/17/2023 4:28 PM  For on call review [email or web address].

## 2023-07-17 NOTE — Plan of Care (Signed)
  Problem: Health Behavior/Discharge Planning: Goal: Ability to manage health-related needs will improve Outcome: Not Progressing   Problem: Education: Goal: Knowledge of General Education information will improve Description: Including pain rating scale, medication(s)/side effects and non-pharmacologic comfort measures Outcome: Not Progressing   Problem: Health Behavior/Discharge Planning: Goal: Ability to manage health-related needs will improve Outcome: Not Progressing  Patient remains confused but able to follow simple commands.

## 2023-07-17 NOTE — Progress Notes (Signed)
Physical Therapy Treatment Patient Details Name: Justin Bird MRN: [id number] DOB: Apr 03, 1941 Today's Date: 07/17/2023   History of Present Illness Pt is an 83 y/o M admitted on 06/29/23 after presenting with AMS. Head CT negative for acute issues (unable to obtain MRI 2/2 cochlear implant). PMH: HOH with cochlear implant, depression, Parkinson's disease    PT Comments  Modified session due to continued confusion, visual and audible hallucinations, significant hearing and visual deficits. Nursing assisted with hygiene as pt was found incontinent of strong smelling urine in bed. ModA for rolling L<>R several times, pt struggles to follow commands, however is fully alert. Attempted B LE strengthening ROM, pt unable to follow commands/proper technique. Will continue to progress as cognition improves.    If plan is discharge home, recommend the following: Two people to help with walking and/or transfers;Two people to help with bathing/dressing/bathroom;Direct supervision/assist for medications management;Help with stairs or ramp for entrance;Assist for transportation;Assistance with feeding;Direct supervision/assist for financial management;Assistance with cooking/housework;Supervision due to cognitive status   Can travel by private vehicle     No  Equipment Recommendations  None recommended by PT    Recommendations for Other Services       Precautions / Restrictions Precautions Precautions: Fall Precaution Comments: L cochlear implant failure, decreased vision     Mobility  Bed Mobility Overal bed mobility: Needs Assistance Bed Mobility: Rolling Rolling: Mod assist, Used rails              Transfers                   General transfer comment: Unable to safely complete, +2 for pt and therapist safety due to poor cognitive state    Ambulation/Gait                   Stairs             Wheelchair Mobility     Tilt Bed    Modified Rankin  (Stroke Patients Only)       Balance                                            Cognition Arousal: Alert Behavior During Therapy: Restless Overall Cognitive Status: No family/caregiver present to determine baseline cognitive functioning Area of Impairment: Orientation, Following commands, Safety/judgement, Attention, Memory, Awareness, Problem solving                 Orientation Level: Disoriented to, Person, Place, Time, Situation Current Attention Level: Focused Memory: Decreased short-term memory Following Commands: Follows one step commands inconsistently Safety/Judgement: Decreased awareness of safety, Decreased awareness of deficits Awareness: Intellectual Problem Solving: Slow processing, Requires verbal cues, Requires tactile cues, Decreased initiation General Comments: Pt confused, asking for "Liborio Nixon" frequently requiring redirection; talking about going to [place], restless attempting to pull out catheter.        Exercises General Exercises - Lower Extremity Ankle Circles/Pumps: AAROM, Both, 10 reps, Supine Heel Slides: AAROM, Both, 5 reps, Supine Hip ABduction/ADduction: AAROM, Both, 5 reps, Supine    General Comments General comments (skin integrity, edema, etc.): Pt incontinent of strong smelling urine, assisted nursing with bed mobility and hygiene. Skin without significant breakdown      Pertinent Vitals/Pain Pain Assessment Pain Assessment: No/denies pain    Home Living  Prior Function            PT Goals (current goals can now be found in the care plan section)      Frequency    Min 1X/week      PT Plan      Co-evaluation              AM-PAC PT "6 Clicks" Mobility   Outcome Measure  Help needed turning from your back to your side while in a flat bed without using bedrails?: A Lot Help needed moving from lying on your back to sitting on the side of a flat bed without  using bedrails?: A Lot Help needed moving to and from a bed to a chair (including a wheelchair)?: Total Help needed standing up from a chair using your arms (e.g., wheelchair or bedside chair)?: A Lot Help needed to walk in hospital room?: Total Help needed climbing 3-5 steps with a railing? : Total 6 Click Score: 9    End of Session   Activity Tolerance: Other (comment) Patient left: in bed;with call bell/phone within reach;with bed alarm set;with restraints reapplied Nurse Communication: Mobility status PT Visit Diagnosis: Muscle weakness (generalized) (M62.81);Other abnormalities of gait and mobility (R26.89);Difficulty in walking, not elsewhere classified (R26.2)     Time: 4098-1191 PT Time Calculation (min) (ACUTE ONLY): 19 min  Charges:    $Therapeutic Activity: 8-22 mins PT General Charges $$ ACUTE PT VISIT: 1 Visit                    Zadie Cleverly, PTA  Jannet Askew 07/17/2023, 3:35 PM

## 2023-07-18 DIAGNOSIS — G9341 Metabolic encephalopathy: Secondary | ICD-10-CM | POA: Diagnosis not present

## 2023-07-18 LAB — CBC WITH DIFFERENTIAL/PLATELET
Abs Immature Granulocytes: 0.03 10*3/uL (ref 0.00–0.07)
Basophils Absolute: 0 10*3/uL (ref 0.0–0.1)
Basophils Relative: 1 %
Eosinophils Absolute: 0.3 10*3/uL (ref 0.0–0.5)
Eosinophils Relative: 3 %
HCT: 51.2 % (ref 39.0–52.0)
Hemoglobin: 16.5 g/dL (ref 13.0–17.0)
Immature Granulocytes: 0 %
Lymphocytes Relative: 14 %
Lymphs Abs: 1.2 10*3/uL (ref 0.7–4.0)
MCH: 29.6 pg (ref 26.0–34.0)
MCHC: 32.2 g/dL (ref 30.0–36.0)
MCV: 91.8 fL (ref 80.0–100.0)
Monocytes Absolute: 0.8 10*3/uL (ref 0.1–1.0)
Monocytes Relative: 9 %
Neutro Abs: 6.2 10*3/uL (ref 1.7–7.7)
Neutrophils Relative %: 73 %
Platelets: 324 10*3/uL (ref 150–400)
RBC: 5.58 MIL/uL (ref 4.22–5.81)
RDW: 12.8 % (ref 11.5–15.5)
WBC: 8.5 10*3/uL (ref 4.0–10.5)
nRBC: 0 % (ref 0.0–0.2)

## 2023-07-18 LAB — BASIC METABOLIC PANEL
Anion gap: 11 (ref 5–15)
BUN: 19 mg/dL (ref 8–23)
CO2: 26 mmol/L (ref 22–32)
Calcium: 8.7 mg/dL — ABNORMAL LOW (ref 8.9–10.3)
Chloride: 104 mmol/L (ref 98–111)
Creatinine, Ser: 0.64 mg/dL (ref 0.61–1.24)
GFR, Estimated: >60 mL/min (ref >60)
Glucose, Bld: 75 mg/dL (ref 70–99)
Potassium: 3.6 mmol/L (ref 3.5–5.1)
Sodium: 141 mmol/L (ref 135–145)

## 2023-07-18 LAB — VITAMIN B1: Vitamin B1 (Thiamine): 109.8 nmol/L (ref 66.5–200.0)

## 2023-07-18 NOTE — Plan of Care (Signed)

## 2023-07-18 NOTE — Progress Notes (Signed)
Progress Note   Patient: Justin Bird AOZ:[id number] DOB: July 15, 1940 DOA: 06/29/2023     18 DOS: the patient was seen and examined on 07/18/2023    Brief Narrative:  83 y.o. male with medical history significant of hard of hearing, depression, Parkinson's disease, allergy, who presents with altered mental status.   Assessment & Plan:   Acute metabolic encephalopathy:  Etiology is not clear.  CT head negative for acute intracranial abnormalities.  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   Potential differential diagnosis to include early stage of dementia, and delirium B12 borderline low and TSH within normal limits.   Patient has possibly some dose of Sinemet, which may have contributed partially due to withdrawal.   Patient received IV Ativan, IV Haldol, IM Zyprexa in ED.  Still not a candidate for inpatient psych admission according to psych team Patient remains on Sinemet for his Parkinson's. Continue Lexapro Depakote, trazodone and Seroquel as per psychiatry. 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   Continue outpatient ENT follow-up   Acute urinary retention A Foley catheter is noted.  Placed on 1/8 for urinary retention.  No known history of BPH.    Voiding trial can be attempted when patient is more ambulatory. Flomax was considered however patient has severe allergic reaction to sulfa medications including anaphylaxis. Continue to monitor urine output closely     DVT prophylaxis: SQ Lovenox Code Status:  Full Family Communication: No family at bedside Disposition Plan: SNF is recommended     Consultants:  Psychiatry   Procedures:  None   Antimicrobials: None      Subjective: Patient seen and examined at bedside this morning Patient still confused continue to exhibit some symptoms of dementia Still having difficulty hearing   Physical Exam: General appearance: Awake alert.  In no distress.  Hard of hearing.  Distracted Resp: Clear to auscultation bilaterally.  Normal effort Cardio: S1-S2 is normal regular.  No S3-S4.  No rubs murmurs or bruit GI: Abdomen is soft.  Nontender nondistended.  Bowel sounds are present normal.  No masses organomegaly    Data Reviewed:    Latest Ref Rng & Units 07/18/2023    5:00 AM 07/17/2023    4:52 AM 07/15/2023    3:47 AM  BMP  Glucose 70 - 99 mg/dL 75  962  952   BUN 8 - 23 mg/dL 19  18  13    Creatinine 0.61 - 1.24 mg/dL 8.41  3.24  4.01   Sodium 135 - 145 mmol/L 141  141  142   Potassium 3.5 - 5.1 mmol/L 3.6  3.6  3.5   Chloride 98 - 111 mmol/L 104  105  105   CO2 22 - 32 mmol/L 26  27  28    Calcium 8.9 - 10.3 mg/dL 8.7  9.0  9.2     Vitals:   07/17/23 2118 07/18/23 0327 07/18/23 0957 07/18/23 1743  BP: 138/64 126/85 121/75 138/88  Pulse: 87 84 96 (!) 47  Resp:   18 20  Temp: 98.1 F (36.7 C) 97.7 F (36.5 C) 97.8 F (36.6  C) 98.2 F (36.8 C)  TempSrc: Oral Oral    SpO2: 94% 96% 99% (!) 83%  Weight:      Height:          Latest Ref Rng & Units 07/18/2023    5:00 AM 07/17/2023    4:52 AM 07/16/2023    4:45 AM  CBC  WBC 4.0 - 10.5 K/uL 8.5  7.2  6.1   Hemoglobin 13.0 - 17.0 g/dL 08.6  57.8  46.9   Hematocrit 39.0 - 52.0 % 51.2  48.1  48.0   Platelets 150 - 400 K/uL 324  312  295      Author: Loyce Dys, MD 07/18/2023 5:47 PM  For on call review [email or web address].

## 2023-07-18 NOTE — Evaluation (Signed)
Occupational Therapy Re-Evaluation Patient Details Name: Justin Bird MRN: [id number] DOB: [birth year] Today's Date: 07/18/2023   History of Present Illness Pt is an 83 y/o M admitted on 06/29/23 after presenting with AMS. Head CT negative for acute issues (unable to obtain MRI 2/2 cochlear implant). PMH: HOH with cochlear implant, depression, Parkinson's disease   Clinical Impression   Pt seen for OT re-evaluation. Goals reviewed and remain appropriate. Pt continues to demonstrate need for skilled OT services in order to maximize safety/indep and minimize caregiver burden and functional decline. Pt functionally impaired with all aspects of ADL and mobility 2/2 deficits noted in cognition, co-morbidities, hearing impairment (cochlear implant not working vs batteries dead), and visual deficits. Pt required tactile cues and hand over hand for initiating self feeding and grooming tasks. Pt had difficulty alternating between drinking from a straw versus using utensils and holding food in hand. Pt was able to improve with repetition resulting in need for supv and setup after hand over hand to drink orange juice with straw and for eating banana. RN notified, as pt may do better during meals with finger foods. Continue to progress towards goals.      If plan is discharge home, recommend the following: Two people to help with walking and/or transfers;Direct supervision/assist for financial management;Two people to help with bathing/dressing/bathroom;Direct supervision/assist for medications management;Supervision due to cognitive status;Assistance with cooking/housework;Help with stairs or ramp for entrance;Assist for transportation    Functional Status Assessment  Patient has had a recent decline in their functional status and demonstrates the ability to make significant improvements in function in a reasonable and predictable amount of time.  Equipment Recommendations  Other (comment) (defer)     Recommendations for Other Services       Precautions / Restrictions Precautions Precautions: Fall Precaution Comments: L cochlear implant failure, decreased vision Restrictions Weight Bearing Restrictions Per Provider Order: No      Mobility Bed Mobility Overal bed mobility: Needs Assistance             General bed mobility comments: Pt able to reposition with MIN A    Transfers                          Balance                                           ADL either performed or assessed with clinical judgement   ADL Overall ADL's : Needs assistance/impaired Eating/Feeding: Sitting;Minimal assistance Eating/Feeding Details (indicate cue type and reason): long sitting in bed, initial hand over hand and MIN A improving to supv after repetition of bring straw cup to mouth to drink, and holding banana to eat. Demos difficulty switching between the two (tried to drink the bananas like a straw). Significant difficulty with utensils, much better at finger foods with assist. RN notified. Grooming: Brushing hair;Bed level;Maximal assistance Grooming Details (indicate cue type and reason): MAX A hand over hand to initiate combing and unable to complete himself             Lower Body Dressing: Bed level;Maximal assistance Lower Body Dressing Details (indicate cue type and reason): don socks               General ADL Comments: Significantly hingered by hearing/visual/cognitive impairments     Vision  Perception         Praxis         Pertinent Vitals/Pain Pain Assessment Pain Assessment: Faces Faces Pain Scale: No hurt     Extremity/Trunk Assessment Upper Extremity Assessment Upper Extremity Assessment: Overall WFL for tasks assessed;Difficult to assess due to impaired cognition   Lower Extremity Assessment Lower Extremity Assessment: Difficult to assess due to impaired cognition;Defer to PT evaluation        Communication Communication Communication: Hearing impairment (L cochlear implant but batteries are dead)   Cognition Arousal: Alert Behavior During Therapy: WFL for tasks assessed/performed Overall Cognitive Status: No family/caregiver present to determine baseline cognitive functioning                                 General Comments: Very HOH, cochlear implant batteries are dead, difficulty sequencing self feeding (? visual agnosia vs visual impairments, vs     General Comments       Exercises     Shoulder Instructions      Home Living Family/patient expects to be discharged to:: Unsure                                 Additional Comments: No family/caregiver available to confirm PLOF or home set up. Per chart, pt living at home alone, adult children [place] of [place].      Prior Functioning/Environment Prior Level of Function : Patient poor historian/Family not available               ADLs Comments: Per chart, pt generally oriented to self and place. Lives alone, spouse is in assisted living facility/SNF        OT Problem List: Decreased coordination;Decreased cognition;Decreased safety awareness;Impaired balance (sitting and/or standing);Decreased knowledge of use of DME or AE;Impaired UE functional use;Impaired vision/perception      OT Treatment/Interventions: Self-care/ADL training;Therapeutic exercise;Therapeutic activities;DME and/or AE instruction;Patient/family education;Balance training;Energy conservation;Neuromuscular education;Cognitive remediation/compensation;Visual/perceptual remediation/compensation    OT Goals(Current goals can be found in the care plan section) Acute Rehab OT Goals Patient Stated Goal: pt unable to state OT Goal Formulation: Patient unable to participate in goal setting Time For Goal Achievement: 08/01/23 Potential to Achieve Goals: Fair  OT Frequency: Min 1X/week    Co-evaluation               AM-PAC OT "6 Clicks" Daily Activity     Outcome Measure Help from another person eating meals?: A Little Help from another person taking care of personal grooming?: A Lot Help from another person toileting, which includes using toliet, bedpan, or urinal?: Total Help from another person bathing (including washing, rinsing, drying)?: A Lot Help from another person to put on and taking off regular upper body clothing?: A Lot Help from another person to put on and taking off regular lower body clothing?: A Lot 6 Click Score: 12   End of Session    Activity Tolerance: Patient tolerated treatment well Patient left: in bed;with call bell/phone within reach;with bed alarm set;with nursing/sitter in room  OT Visit Diagnosis: Other abnormalities of gait and mobility (R26.89);Other symptoms and signs involving cognitive function                Time: 1478-2956 OT Time Calculation (min): 23 min Charges:  OT General Charges $OT Visit: 1 Visit OT Evaluation $OT Re-eval: 1 Re-eval OT Treatments $Self  Care/Home Management : 8-22 mins  Arman Filter., MPH, MS, OTR/L ascom [phone] 07/18/23, 10:50 AM

## 2023-07-18 NOTE — Consult Note (Signed)
[facility] Psychiatric Consult Follow-up  Patient Name: .Justin Bird  MRN: [id number]  DOB: 27-Jan-1941  Consult Order details:  Orders (From admission, onward)     Start     Ordered   07/04/23 1329  IP CONSULT TO PSYCHIATRY       Ordering Provider: Gillis Santa, MD  Provider:  (Not yet assigned)  Question Answer Comment  Location [facility]   Reason for Consult? Agitation, AMS      07/04/23 1328            Mode of Visit: In person   Psychiatry Consult Evaluation  Service Date: July 18, 2023 LOS:  LOS: 18 days  Chief Complaint "I need to get out of here"  Primary Psychiatric Diagnoses  Delirium due to multiple etiologies - metabolic encephalopathy   Assessment  Justin Bird is a 83 y.o. male w/ history of hard of hearing, depression, anxiety, parkinsons, who was medically admitted on 06/29/2023 w/ concerns for acute metabolic encephalopathy. Patient is hard of hearing, with cochlear implant failure, altered sensorium, which have presented as significant barriers to communication. Suspect these are contributing to episodes of agitation.   On assessment today, pt continues to be disoriented and a poor historian. Unable to carry a coherent conversation. Tells me that he needs to go see his grandmother. Tells me "I need to get out of here". Pt has not needed PRN geodon since 07/14/23. Not wearing mitts on assessment today.   Diagnoses:  Active Hospital problems: Principal Problem:   Acute metabolic encephalopathy Active Problems:   Parkinson's disease (HCC)   Plan   ## Psychiatric Medication Recommendations:  -Continue seroquel 150mg  oral 3 times daily -Continue trazodone 150mg  oral daily at bedtime -Continue depakote sprinkle 250mg  oral every 12 hours -Continue lexapro 5mg  oral daily -Continue geodon 10mg  IM every 4 hours PRN agitation  ## Medical Decision Making Capacity: Not specifically addressed during assessment   ## Further  Work-up:  -- Defer further work-up to primary team  ## Disposition:-- Does not meet criteria for inpatient psychiatric admission.  ## Behavioral / Environmental: -Delirium Precautions: Delirium Interventions for Nursing and Staff: - RN to open blinds every AM. - To Bedside: Glasses, hearing aide, and pt's own shoes. Make available to patients. when possible and encourage use. - Encourage po fluids when appropriate, keep fluids within reach. - OOB to chair with meals. - Passive ROM exercises to all extremities with AM & PM care. - RN to assess orientation to person, time and place QAM and PRN. - Recommend extended visitation hours with familiar family/friends as feasible. - Staff to minimize disturbances at night. Turn off television when pt asleep or when not in use.  ## Safety and Observation Level:  - Based on my clinical evaluation, I estimate the patient to be at LOW risk of self harm in the current setting. - At this time, we recommend  routine safety precautions. This decision is based on my review of the chart including patient's history and current presentation, interview of the patient, mental status examination, and consideration of suicide risk including evaluating suicidal ideation, plan, intent, suicidal or self-harm behaviors, risk factors, and protective factors. This judgment is based on our ability to directly address suicide risk, implement suicide prevention strategies, and develop a safety plan while the patient is in the clinical setting. Please contact our team if there is a concern that risk level has changed.  CSSR Risk Category:C-SSRS RISK CATEGORY: No Risk  Suicide Risk Assessment:  Patient has following non-modifiable or demographic risk factors for suicide: male gender Patient has the following protective factors against suicide: Supportive family  Thank you for this consult request. Recommendations have been communicated to the primary team.  We will continue to follow at  this time.   Lauree Chandler, NP       History of Present Illness  Patient Report:  Poor historian. Unable to carry a coherent conversation. Tells me that he needs to go see his grandmother. Tells me "I need to get out of here".   Psych ROS:  Unable to assess  ROS  Unable to assess  Psychiatric and Social History  Psychiatric History:  Prev Dx/Sx: Per chart review, history of depression/anxiety Previous Med Trials: Per chart review, had been on lexapro previously Prior Psych Hospitalization: Do not see history of psych hospitalization in chart  Exam Findings  Vital Signs:  Temp:  [97.5 F (36.4 C)-98.1 F (36.7 C)] 97.7 F (36.5 C) (01/21 0327) Pulse Rate:  [84-94] 84 (01/21 0327) Resp:  [18] 18 (01/20 1522) BP: (126-138)/(64-85) 126/85 (01/21 0327) SpO2:  [94 %-99 %] 96 % (01/21 0327) Blood pressure 126/85, pulse 84, temperature 97.7 F (36.5 C), temperature source Oral, resp. rate 18, height 5\' 5"  (1.651 m), weight 71.7 kg, SpO2 96%. Body mass index is 26.3 kg/m.  Physical Exam Neurological:     Mental Status: He is alert. He is disoriented.    Mental Status Exam: General Appearance: Disheveled  Orientation:  Other:  disoriented  Memory:  Immediate;   Poor Recent;   Poor Remote;   Poor  Concentration:  Concentration: Poor and Attention Span: Poor  Recall:  Poor  Attention  Poor  Eye Contact:  Fair  Speech:   Unable to hold a coherent conversation  Language:  Poor  Volume:  Decreased  Mood: "not good"  Affect:   Anxious  Thought Process:  Disorganized  Thought Content:  Rumination and Tangential  Suicidal Thoughts:   unable to assess  Homicidal Thoughts:   unable to assess  Judgement:  Poor  Insight:  Lacking  Psychomotor Activity:  Restlessness  Akathisia:  No  Fund of Knowledge:  Poor      Assets:  Financial Resources/Insurance  Cognition:  Impaired,  Severe  ADL's:  Impaired  AIMS (if indicated):      Other History   These have been  pulled in through the EMR, reviewed, and updated if appropriate.  Family History:  The patient's family history includes Lung cancer in his mother.  Medical History: Past Medical History:  Diagnosis Date   Allergy    Hearing deficit    Parkinson's disease [facility])    Surgical History: Past Surgical History:  Procedure Laterality Date   COCHLEAR IMPLANT Left    PARTIAL COLECTOMY     Medications:   Current Facility-Administered Medications:    acetaminophen (TYLENOL) tablet 650 mg, 650 mg, Oral, Q6H PRN, Lorretta Harp, MD, 650 mg at 07/08/23 2134   [COMPLETED] bisacodyl (DULCOLAX) EC tablet 10 mg, 10 mg, Oral, BID, 10 mg at 07/06/23 2122 **FOLLOWED BY** bisacodyl (DULCOLAX) EC tablet 10 mg, 10 mg, Oral, QHS, Gillis Santa, MD, 10 mg at 07/16/23 2233   bisacodyl (DULCOLAX) suppository 10 mg, 10 mg, Rectal, Daily PRN, Gillis Santa, MD, 10 mg at 07/13/23 0116   carbidopa-levodopa (SINEMET IR) 25-100 MG per tablet immediate release 2 tablet, 2 tablet, Oral, TID, Phineas Semen, MD, 2 tablet at 07/17/23 1450   cyanocobalamin (VITAMIN B12) injection  1,000 mcg, 1,000 mcg, Intramuscular, Once, Djan, Prince T, MD   cyanocobalamin (VITAMIN B12) tablet 1,000 mcg, 1,000 mcg, Oral, Daily, Lorretta Harp, MD, 1,000 mcg at 07/17/23 0813   divalproex (DEPAKOTE SPRINKLE) capsule 250 mg, 250 mg, Oral, Q12H, Verner Chol, MD, 250 mg at 07/17/23 1308   doxazosin (CARDURA) tablet 1 mg, 1 mg, Oral, Daily, Gillis Santa, MD, 1 mg at 07/17/23 0812   enoxaparin (LOVENOX) injection 40 mg, 40 mg, Subcutaneous, Q24H, Lorretta Harp, MD, 40 mg at 07/16/23 2246   escitalopram (LEXAPRO) tablet 5 mg, 5 mg, Oral, Daily, Lauree Chandler, NP, 5 mg at 07/17/23 0813   feeding supplement (ENSURE ENLIVE / ENSURE PLUS) liquid 237 mL, 237 mL, Oral, BID BM, Gillis Santa, MD, 237 mL at 07/17/23 1451   hydrALAZINE (APRESOLINE) injection 5 mg, 5 mg, Intravenous, Q2H PRN, Lorretta Harp, MD   latanoprost (XALATAN) 0.005 % ophthalmic  solution 1 drop, 1 drop, Both Eyes, QHS, Niu, Brien Few, MD, 1 drop at 07/16/23 2233   ondansetron (ZOFRAN) injection 4 mg, 4 mg, Intravenous, Q8H PRN, Lorretta Harp, MD   polyethylene glycol (MIRALAX / GLYCOLAX) packet 17 g, 17 g, Oral, BID, Gillis Santa, MD, 17 g at 07/17/23 0813   polyvinyl alcohol (LIQUIFILM TEARS) 1.4 % ophthalmic solution 2 drop, 2 drop, Both Eyes, PRN, Sreenath, Sudheer B, MD   QUEtiapine (SEROQUEL) tablet 150 mg, 150 mg, Oral, TID, Djan, Prince T, MD, 150 mg at 07/17/23 1450   traZODone (DESYREL) tablet 150 mg, 150 mg, Oral, QHS, Sreenath, Sudheer B, MD, 150 mg at 07/16/23 2232   ziprasidone (GEODON) injection 10 mg, 10 mg, Intramuscular, Q4H PRN, Lolita Patella B, MD, 10 mg at 07/14/23 2134  Allergies: Allergies  Allergen Reactions   Opium Poppy [Papaver] Anaphylaxis and Hives   Penicillins Anaphylaxis and Rash   Sulfa Antibiotics Anaphylaxis   Penicillins    Sulfa Antibiotics    Lauree Chandler, NP

## 2023-07-19 DIAGNOSIS — G9341 Metabolic encephalopathy: Secondary | ICD-10-CM | POA: Diagnosis not present

## 2023-07-19 LAB — CBC WITH DIFFERENTIAL/PLATELET
Abs Immature Granulocytes: 0.04 10*3/uL (ref 0.00–0.07)
Basophils Absolute: 0 10*3/uL (ref 0.0–0.1)
Basophils Relative: 0 %
Eosinophils Absolute: 0.2 10*3/uL (ref 0.0–0.5)
Eosinophils Relative: 2 %
HCT: 50.4 % (ref 39.0–52.0)
Hemoglobin: 16.7 g/dL (ref 13.0–17.0)
Immature Granulocytes: 1 %
Lymphocytes Relative: 13 %
Lymphs Abs: 1.1 10*3/uL (ref 0.7–4.0)
MCH: 29.7 pg (ref 26.0–34.0)
MCHC: 33.1 g/dL (ref 30.0–36.0)
MCV: 89.5 fL (ref 80.0–100.0)
Monocytes Absolute: 0.8 10*3/uL (ref 0.1–1.0)
Monocytes Relative: 9 %
Neutro Abs: 6.7 10*3/uL (ref 1.7–7.7)
Neutrophils Relative %: 75 %
Platelets: 318 10*3/uL (ref 150–400)
RBC: 5.63 MIL/uL (ref 4.22–5.81)
RDW: 12.7 % (ref 11.5–15.5)
WBC: 8.8 10*3/uL (ref 4.0–10.5)
nRBC: 0 % (ref 0.0–0.2)

## 2023-07-19 LAB — BASIC METABOLIC PANEL
Anion gap: 12 (ref 5–15)
BUN: 16 mg/dL (ref 8–23)
CO2: 27 mmol/L (ref 22–32)
Calcium: 9 mg/dL (ref 8.9–10.3)
Chloride: 102 mmol/L (ref 98–111)
Creatinine, Ser: 0.68 mg/dL (ref 0.61–1.24)
GFR, Estimated: >60 mL/min (ref >60)
Glucose, Bld: 102 mg/dL — ABNORMAL HIGH (ref 70–99)
Potassium: 3.7 mmol/L (ref 3.5–5.1)
Sodium: 141 mmol/L (ref 135–145)

## 2023-07-19 NOTE — Progress Notes (Signed)
PROGRESS NOTE    Justin Bird   WUJ:[id number] DOB: May 14, 1941  DOA: 06/29/2023 Date of Service: 07/19/23 which is hospital day 19  PCP: Dorothey Baseman, MD    Hospital course / significant events:   HPI: 83 y.o. male with medical history significant of hard of hearing, depression, Parkinson's disease, allergy, who presents with altered mental status. Per daughter, at baseline patient is oriented to person and place, usually is confused about the time. In the past 4 days, patient has been confused. Patient is agitated, yelling around the ED. He moves all extremities, kicking around.   01/02: admitted to hospitalist service w/ AMS, neurology consult. Of note, cannot do MRI due to presence of cochlear implant.  01/04: initial psych consult - they have been intermittently following  01/05-01/22: placement pending      Consultants:  Neurology  Psychiatry   Procedures/Surgeries: none      ASSESSMENT & PLAN:   Acute encephalopathy Metabolic/Toxic causes reasonably ruled out  Etiology is not clear. Ddx most c/w advancing dementia / vascular dementia, mild B12 deficiency but should not cause this, missed Sinemet, which may have contributed partially due to withdrawal. Metabolic and infectious workup have been negative so far Psychiatric medications:  Continue seroquel 150mg  oral 3 times daily Continue trazodone 150mg  oral daily at bedtime Continue depakote sprinkle 250mg  oral every 12 hours Continue lexapro 5mg  oral daily Continue geodon 10mg  IM every 4 hours PRN agitation. Of note, has not needed PRN geodon since 07/14/23. not a candidate for inpatient psych admission according to psych team Patient remains on Sinemet for his Parkinson's. 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   Continue outpatient ENT follow-up   Acute urinary retention A Foley catheter is noted.  Placed on 1/8 for urinary retention.  No known history of BPH.    Voiding trial can be attempted when patient is more ambulatory. Flomax was considered however patient has severe allergic reaction to sulfa medications including anaphylaxis. Continue to monitor urine output closely      overweigh based on BMI: Body mass index is 26.3 kg/m.  Underweight - under 18  overweight - 25 to 29 obese - 30 or more Class 1 obesity: BMI of 30.0 to 34 Class 2 obesity: BMI of 35.0 to 39 Class 3 obesity: BMI of 40.0 to 49 Super Morbid Obesity: BMI 50-59 Super-super Morbid Obesity: BMI 60+ Significantly low or high BMI is associated with higher medical risk.  Weight management advised as adjunct to other disease management and risk reduction treatments    DVT prophylaxis: lovenox IV fluids: no continuous IV fluids  Nutrition: regular diet  Central lines / invasive devices: none  Code Status: FULL CODE ACP documentation reviewed:  none on file in [facility]  TOC needs: placement  Barriers to dispo / significant pending items: placement              Subjective / Brief ROS:  Patient is alert, he is resting in bed staring at TV, he can focus on me when I speak w/ him but he is not responding coherently to questions   Family Communication: none at this time     Objective Findings:  Vitals:   07/18/23 0957 07/18/23  1743 07/18/23 1958 07/19/23 0524  BP: 121/75 138/88 138/78 (!) 144/76  Pulse: 96 (!) 47 (!) 101 87  Resp: 18 20    Temp: 97.8 F (36.6 C) 98.2 F (36.8 C) 97.7 F (36.5 C) 97.7 F (36.5 C)  TempSrc:      SpO2: 99% (!) 83% 94% 94%  Weight:      Height:        Intake/Output Summary (Last 24 hours) at 07/19/2023 1636 Last data filed at 07/19/2023  1408 Gross per 24 hour  Intake 240 ml  Output --  Net 240 ml   Filed Weights   07/12/23 0857  Weight: 71.7 kg    Examination:  Physical Exam Constitutional:      General: He is not in acute distress.    Appearance: He is not ill-appearing.  Cardiovascular:     Rate and Rhythm: Normal rate and regular rhythm.  Pulmonary:     Effort: Pulmonary effort is normal.     Breath sounds: Normal breath sounds.  Abdominal:     Palpations: Abdomen is soft.  Musculoskeletal:     Right lower leg: No edema.     Left lower leg: No edema.  Skin:    General: Skin is warm and dry.  Neurological:     Mental Status: He is alert. Mental status is at baseline.          Scheduled Medications:   bisacodyl  10 mg Oral QHS   carbidopa-levodopa  2 tablet Oral TID   cyanocobalamin  1,000 mcg Intramuscular Once   cyanocobalamin  1,000 mcg Oral Daily   divalproex  250 mg Oral Q12H   doxazosin  1 mg Oral Daily   enoxaparin (LOVENOX) injection  40 mg Subcutaneous Q24H   escitalopram  5 mg Oral Daily   feeding supplement  237 mL Oral BID BM   latanoprost  1 drop Both Eyes QHS   polyethylene glycol  17 g Oral BID   QUEtiapine  150 mg Oral TID   traZODone  150 mg Oral QHS    Continuous Infusions:   PRN Medications:  acetaminophen, bisacodyl, hydrALAZINE, ondansetron (ZOFRAN) IV, polyvinyl alcohol, ziprasidone  Antimicrobials from admission:  Anti-infectives (From admission, onward)    None           Data Reviewed:  I have personally reviewed the following...  CBC: Recent Labs  Lab 07/15/23 0347 07/16/23 0445 07/17/23 0452 07/18/23 0500 07/19/23 0520  WBC 6.2 6.1 7.2 8.5 8.8  NEUTROABS 3.4 3.8 5.1 6.2 6.7  HGB 15.6 15.7 15.8 16.5 16.7  HCT 46.0 48.0 48.1 51.2 50.4  MCV 89.0 91.3 91.3 91.8 89.5  PLT 300 295 312 324 318   Basic Metabolic Panel: Recent Labs  Lab 07/14/23 0801 07/15/23 0347 07/17/23 0452 07/18/23 0500 07/19/23 0520  NA 145 142 141 141 141  K  3.7 3.5 3.6 3.6 3.7  CL 105 105 105 104 102  CO2 28 28 27 26 27   GLUCOSE 99 108* 100* 75 102*  BUN 12 13 18 19 16   CREATININE 0.59* 0.62 0.63 0.64 0.68  CALCIUM 9.3 9.2 9.0 8.7* 9.0   GFR: Estimated Creatinine Clearance: 63 mL/min (by C-G formula based on SCr of 0.68 mg/dL). Liver Function Tests: No results for input(s): "AST", "ALT", "ALKPHOS", "BILITOT", "PROT", "ALBUMIN" in the last 168 hours. No results for input(s): "LIPASE", "AMYLASE" in the last 168 hours. No results for input(s): "AMMONIA" in the last 168 hours. Coagulation Profile: No results for  input(s): "INR", "PROTIME" in the last 168 hours. Cardiac Enzymes: No results for input(s): "CKTOTAL", "CKMB", "CKMBINDEX", "TROPONINI" in the last 168 hours. BNP (last 3 results) No results for input(s): "PROBNP" in the last 8760 hours. HbA1C: No results for input(s): "HGBA1C" in the last 72 hours. CBG: No results for input(s): "GLUCAP" in the last 168 hours. Lipid Profile: No results for input(s): "CHOL", "HDL", "LDLCALC", "TRIG", "CHOLHDL", "LDLDIRECT" in the last 72 hours. Thyroid Function Tests: No results for input(s): "TSH", "T4TOTAL", "FREET4", "T3FREE", "THYROIDAB" in the last 72 hours. Anemia Panel: No results for input(s): "VITAMINB12", "FOLATE", "FERRITIN", "TIBC", "IRON", "RETICCTPCT" in the last 72 hours. Most Recent Urinalysis On File:     Component Value Date/Time   COLORURINE YELLOW (A) 06/29/2023 1902   APPEARANCEUR CLEAR (A) 06/29/2023 1902   LABSPEC 1.019 06/29/2023 1902   PHURINE 6.0 06/29/2023 1902   GLUCOSEU NEGATIVE 06/29/2023 1902   HGBUR NEGATIVE 06/29/2023 1902   BILIRUBINUR NEGATIVE 06/29/2023 1902   KETONESUR 20 (A) 06/29/2023 1902   PROTEINUR NEGATIVE 06/29/2023 1902   NITRITE NEGATIVE 06/29/2023 1902   LEUKOCYTESUR NEGATIVE 06/29/2023 1902   Sepsis Labs: @LABRCNTIP (procalcitonin:4,lacticidven:4) Microbiology: No results found for this or any previous visit (from the past 240 hours).     Radiology Studies last 3 days: No results found.        Sunnie Nielsen, DO [facility] 07/19/2023, 4:36 PM    Dictation software may have been used to generate the above note. Typos may occur and escape review in typed/dictated notes. Please contact Dr Lyn Hollingshead directly for clarity if needed.  Staff may message me via secure chat in Epic  but this may not receive an immediate response,  please page me for urgent matters!  If 7PM-7AM, please contact night coverage [email or web address]

## 2023-07-19 NOTE — Plan of Care (Signed)
  Problem: Clinical Measurements: Goal: Will remain free from infection Outcome: Progressing   Problem: Clinical Measurements: Goal: Diagnostic test results will improve Outcome: Progressing   Problem: Clinical Measurements: Goal: Respiratory complications will improve Outcome: Progressing   Problem: Clinical Measurements: Goal: Cardiovascular complication will be avoided Outcome: Progressing   Problem: Coping: Goal: Level of anxiety will decrease Outcome: Progressing   

## 2023-07-19 NOTE — Plan of Care (Signed)

## 2023-07-19 NOTE — Hospital Course (Addendum)
Hospital course / significant events:   HPI: 83 y.o. male with medical history significant of hard of hearing, depression, Parkinson's disease, allergy, who presents with altered mental status. Per daughter, at baseline patient is oriented to person and place, usually is confused about the time. In the past 4 days, patient has been confused. Patient is agitated, yelling around the ED. He moves all extremities, kicking around.   01/02: admitted to hospitalist service w/ AMS, neurology consult. Of note, cannot do MRI due to presence of cochlear implant.  01/04: initial psych consult - they have been intermittently following  01/05-01/27: placement pending and approved to go 01/24 but was agitated and EMS unable to take him that evening.  He was here through the weekend, SNF was unable to take him 01/27, looking into other options. 01/28: Patient has been calm past several days, cooperative.  Significant hard of hearing, have not had his cochlear implant in, staff instructed to use this when working with him. Working on placement with TOC.  Spoke to daughter-in-law, goal is still to get him to [facility] if possible because that is where his wife is, that is where they are wanting to eventually set him up with LTC     Consultants:  Neurology  Psychiatry   Procedures/Surgeries: none      ASSESSMENT & PLAN:   Acute encephalopathy Metabolic/Toxic causes reasonably ruled out  Etiology is not clear. Ddx most c/w advancing dementia / vascular dementia, mild B12 deficiency but should not cause this, missed Sinemet, which may have contributed partially due to withdrawal. Metabolic and infectious workup have been negative  Psychiatric medications:  Continue seroquel 150mg  oral 3 times daily Continue trazodone 150mg  oral daily at bedtime Continue depakote sprinkle 250mg  oral every 12 hours Continue lexapro 5mg  oral daily Continue ativan 1 mg bid  not a candidate for inpatient psych  admission according to psych team Patient remains on Sinemet for his Parkinson's. 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  Placed on 1/8 for urinary retention.  No known history of BPH.    Voiding trial can be attempted when patient is more ambulatory. Flomax was considered however patient has severe allergic reaction to sulfa medications including anaphylaxis. Continue to monitor urine output closely      overweigh based on BMI: Body mass index is 26.3 kg/m.  Underweight - under 18  overweight - 25 to 29 obese - 30 or more Class 1 obesity: BMI of 30.0 to 34 Class 2 obesity: BMI of 35.0 to 39 Class 3 obesity: BMI of 40.0 to 49 Super Morbid Obesity: BMI 50-59 Super-super Morbid Obesity: BMI 60+ Significantly low or high BMI is associated with higher medical risk.  Weight management advised as adjunct to other disease management and risk reduction treatments    DVT prophylaxis: lovenox IV fluids: no continuous IV fluids  Nutrition: regular diet  Central lines / invasive devices: none  Code Status: FULL CODE ACP documentation reviewed:  none on file in [facility]  TOC needs: placement  Barriers to dispo / significant pending items: placement

## 2023-07-20 DIAGNOSIS — G9341 Metabolic encephalopathy: Secondary | ICD-10-CM | POA: Diagnosis not present

## 2023-07-20 DIAGNOSIS — R4182 Altered mental status, unspecified: Secondary | ICD-10-CM

## 2023-07-20 DIAGNOSIS — Z515 Encounter for palliative care: Secondary | ICD-10-CM

## 2023-07-20 DIAGNOSIS — G20A1 Parkinson's disease without dyskinesia, without mention of fluctuations: Secondary | ICD-10-CM | POA: Diagnosis not present

## 2023-07-20 NOTE — Progress Notes (Addendum)
PROGRESS NOTE    Justin Bird   ZOX:[id number] DOB: [birth year]  DOA: 06/29/2023 Date of Service: 07/20/23 which is hospital day 20  PCP: Dorothey Baseman, MD    Hospital course / significant events:   HPI: 83 y.o. male with medical history significant of hard of hearing, depression, Parkinson's disease, allergy, who presents with altered mental status. Per daughter, at baseline patient is oriented to person and place, usually is confused about the time. In the past 4 days, patient has been confused. Patient is agitated, yelling around the ED. He moves all extremities, kicking around.   01/02: admitted to hospitalist service w/ AMS, neurology consult. Of note, cannot do MRI due to presence of cochlear implant.  01/04: initial psych consult - they have been intermittently following  01/05-01/23: placement pending      Consultants:  Neurology  Psychiatry   Procedures/Surgeries: none      ASSESSMENT & PLAN:   Acute encephalopathy Metabolic/Toxic causes reasonably ruled out  Etiology is not clear. Ddx most c/w advancing dementia / vascular dementia, mild B12 deficiency but should not cause this, missed Sinemet, which may have contributed partially due to withdrawal. Metabolic and infectious workup have been negative so far Psychiatric medications:  Continue seroquel 150mg  oral 3 times daily Continue trazodone 150mg  oral daily at bedtime Continue depakote sprinkle 250mg  oral every 12 hours Continue lexapro 5mg  oral daily Continue geodon 10mg  IM every 4 hours PRN agitation. Of note, has not needed PRN geodon since 07/14/23. not a candidate for inpatient psych admission according to psych team Patient remains on Sinemet for his Parkinson's. 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   Continue outpatient ENT follow-up   Acute urinary retention A Foley catheter is noted.  Placed on 1/8 for urinary retention.  No known history of BPH.    Voiding trial can be attempted when patient is more ambulatory. Flomax was considered however patient has severe allergic reaction to sulfa medications including anaphylaxis. Continue to monitor urine output closely      overweigh based on BMI: Body mass index is 26.3 kg/m.  Underweight - under 18  overweight - 25 to 29 obese - 30 or more Class 1 obesity: BMI of 30.0 to 34 Class 2 obesity: BMI of 35.0 to 39 Class 3 obesity: BMI of 40.0 to 49 Super Morbid Obesity: BMI 50-59 Super-super Morbid Obesity: BMI 60+ Significantly low or high BMI is associated with higher medical risk.  Weight management advised as adjunct to other disease management and risk reduction treatments    DVT prophylaxis: lovenox IV fluids: no continuous IV fluids  Nutrition: regular diet  Central lines / invasive devices: none  Code Status: FULL CODE ACP documentation reviewed:  none on file in [facility]  TOC needs: placement  Barriers to dispo / significant pending items: placement              Subjective / Brief ROS:  Patient is alert, verbal but garbled speech, repeating "we have 20 minutes left" and I cannot make out other distinct words    Family Communication: none at this time     Objective Findings:  Vitals:   07/19/23 1200 07/19/23 1740  07/20/23 0421 07/20/23 0852  BP: (!) 140/60 116/67 139/78 (!) 128/91  Pulse: 86 99 (!) 101   Resp: 18 19 18 18   Temp: 98 F (36.7 C) 97.7 F (36.5 C) 98.2 F (36.8 C) 98.5 F (36.9 C)  TempSrc: Oral Oral Axillary Oral  SpO2: 96% 96% 96% (!) 83%  Weight:      Height:        Intake/Output Summary (Last 24 hours) at 07/20/2023 1518 Last data  filed at 07/19/2023 2231 Gross per 24 hour  Intake 120 ml  Output --  Net 120 ml   Filed Weights   07/12/23 0857  Weight: 71.7 kg    Examination:  Physical Exam Constitutional:      General: He is not in acute distress.    Appearance: He is not ill-appearing.  Cardiovascular:     Rate and Rhythm: Normal rate and regular rhythm.  Pulmonary:     Effort: Pulmonary effort is normal.     Breath sounds: Normal breath sounds.  Abdominal:     Palpations: Abdomen is soft.  Musculoskeletal:     Right lower leg: No edema.     Left lower leg: No edema.  Skin:    General: Skin is warm and dry.  Neurological:     Mental Status: He is alert. Mental status is at baseline. He is disoriented.          Scheduled Medications:   bisacodyl  10 mg Oral QHS   carbidopa-levodopa  2 tablet Oral TID   cyanocobalamin  1,000 mcg Intramuscular Once   cyanocobalamin  1,000 mcg Oral Daily   divalproex  250 mg Oral Q12H   doxazosin  1 mg Oral Daily   enoxaparin (LOVENOX) injection  40 mg Subcutaneous Q24H   escitalopram  5 mg Oral Daily   feeding supplement  237 mL Oral BID BM   latanoprost  1 drop Both Eyes QHS   polyethylene glycol  17 g Oral BID   QUEtiapine  150 mg Oral TID   traZODone  150 mg Oral QHS    Continuous Infusions:   PRN Medications:  acetaminophen, bisacodyl, hydrALAZINE, ondansetron (ZOFRAN) IV, polyvinyl alcohol, ziprasidone  Antimicrobials from admission:  Anti-infectives (From admission, onward)    None           Data Reviewed:  I have personally reviewed the following...  CBC: Recent Labs  Lab 07/15/23 0347 07/16/23 0445 07/17/23 0452 07/18/23 0500 07/19/23 0520  WBC 6.2 6.1 7.2 8.5 8.8  NEUTROABS 3.4 3.8 5.1 6.2 6.7  HGB 15.6 15.7 15.8 16.5 16.7  HCT 46.0 48.0 48.1 51.2 50.4  MCV 89.0 91.3 91.3 91.8 89.5  PLT 300 295 312 324 318   Basic Metabolic Panel: Recent Labs  Lab 07/14/23 0801 07/15/23 0347 07/17/23 0452 07/18/23 0500  07/19/23 0520  NA 145 142 141 141 141  K 3.7 3.5 3.6 3.6 3.7  CL 105 105 105 104 102  CO2 28 28 27 26 27   GLUCOSE 99 108* 100* 75 102*  BUN 12 13 18 19 16   CREATININE 0.59* 0.62 0.63 0.64 0.68  CALCIUM 9.3 9.2 9.0 8.7* 9.0   GFR: Estimated Creatinine Clearance: 63 mL/min (by C-G formula based on SCr of 0.68 mg/dL). Liver Function Tests: No results for input(s): "AST", "ALT", "ALKPHOS", "BILITOT", "PROT", "ALBUMIN" in the last 168 hours. No results for input(s): "LIPASE", "AMYLASE" in the last 168 hours. No results for input(s): "AMMONIA" in the last 168 hours. Coagulation Profile: No  results for input(s): "INR", "PROTIME" in the last 168 hours. Cardiac Enzymes: No results for input(s): "CKTOTAL", "CKMB", "CKMBINDEX", "TROPONINI" in the last 168 hours. BNP (last 3 results) No results for input(s): "PROBNP" in the last 8760 hours. HbA1C: No results for input(s): "HGBA1C" in the last 72 hours. CBG: No results for input(s): "GLUCAP" in the last 168 hours. Lipid Profile: No results for input(s): "CHOL", "HDL", "LDLCALC", "TRIG", "CHOLHDL", "LDLDIRECT" in the last 72 hours. Thyroid Function Tests: No results for input(s): "TSH", "T4TOTAL", "FREET4", "T3FREE", "THYROIDAB" in the last 72 hours. Anemia Panel: No results for input(s): "VITAMINB12", "FOLATE", "FERRITIN", "TIBC", "IRON", "RETICCTPCT" in the last 72 hours. Most Recent Urinalysis On File:     Component Value Date/Time   COLORURINE YELLOW (A) 06/29/2023 1902   APPEARANCEUR CLEAR (A) 06/29/2023 1902   LABSPEC 1.019 06/29/2023 1902   PHURINE 6.0 06/29/2023 1902   GLUCOSEU NEGATIVE 06/29/2023 1902   HGBUR NEGATIVE 06/29/2023 1902   BILIRUBINUR NEGATIVE 06/29/2023 1902   KETONESUR 20 (A) 06/29/2023 1902   PROTEINUR NEGATIVE 06/29/2023 1902   NITRITE NEGATIVE 06/29/2023 1902   LEUKOCYTESUR NEGATIVE 06/29/2023 1902   Sepsis Labs: @LABRCNTIP (procalcitonin:4,lacticidven:4) Microbiology: No results found for this or any  previous visit (from the past 240 hours).    Radiology Studies last 3 days: No results found.        Sunnie Nielsen, DO [facility] 07/20/2023, 3:18 PM    Dictation software may have been used to generate the above note. Typos may occur and escape review in typed/dictated notes. Please contact Dr Lyn Hollingshead directly for clarity if needed.  Staff may message me via secure chat in Epic  but this may not receive an immediate response,  please page me for urgent matters!  If 7PM-7AM, please contact night coverage [email or web address]

## 2023-07-20 NOTE — Plan of Care (Signed)

## 2023-07-20 NOTE — Progress Notes (Signed)
Physical Therapy Treatment Patient Details Name: Justin Bird MRN: [id number] DOB: 05-12-1941 Today's Date: 07/20/2023   History of Present Illness Pt is an 83 y/o M admitted on 06/29/23 after presenting with AMS. Head CT negative for acute issues (unable to obtain MRI 2/2 cochlear implant). PMH: HOH with cochlear implant, depression, Parkinson's disease    PT Comments  Pt received in bed, nursing in to assist. Increased time to assist pt to EOB due to impaired cognitive status. Pt sat EOB with Supervision with several sit<>stand transfers with ModAx2. Unable to progress gait due to pt not understanding desired task.  Pt however has been witnessed ambulating in hall with nursing, therefore daily fluctuation in assist for mobility. Will continue to progress as tolerated.    If plan is discharge home, recommend the following: Two people to help with walking and/or transfers;Two people to help with bathing/dressing/bathroom;Direct supervision/assist for medications management;Help with stairs or ramp for entrance;Assist for transportation;Assistance with feeding;Direct supervision/assist for financial management;Assistance with cooking/housework;Supervision due to cognitive status   Can travel by private vehicle     No  Equipment Recommendations  None recommended by PT    Recommendations for Other Services       Precautions / Restrictions Precautions Precautions: Fall Precaution Comments: L cochlear implant failure, decreased vision Restrictions Weight Bearing Restrictions Per Provider Order: No     Mobility  Bed Mobility Overal bed mobility: Needs Assistance Bed Mobility: Rolling Rolling: Mod assist, Used rails   Supine to sit: Mod assist, +2 for physical assistance Sit to supine: Mod assist, +2 for physical assistance   General bed mobility comments: Increased assist today due to increased confusion    Transfers Overall transfer level: Needs assistance Equipment used: 2  person hand held assist Transfers: Sit to/from Stand Sit to Stand: +2 physical assistance, Mod assist           General transfer comment: Pt stood x2 at EOB with ModA x2. Only tolerated a few seconds due to confusion and not understanding task    Ambulation/Gait                   Stairs             Wheelchair Mobility     Tilt Bed    Modified Rankin (Stroke Patients Only)       Balance Overall balance assessment: Needs assistance Sitting-balance support: Feet supported Sitting balance-Leahy Scale: Fair Sitting balance - Comments: Inital assist to attain static sitting progressing to Supervision   Standing balance support: Bilateral upper extremity supported Standing balance-Leahy Scale: Poor Standing balance comment: Poor standing tolerance this date, has been seen ambulating in hall without AD with nursing when well                            Cognition Arousal: Alert Behavior During Therapy: Restless Overall Cognitive Status: No family/caregiver present to determine baseline cognitive functioning Area of Impairment: Orientation, Following commands, Safety/judgement, Attention, Memory, Awareness, Problem solving                 Orientation Level: Disoriented to, Place, Time, Situation Current Attention Level: Focused Memory: Decreased short-term memory Following Commands: Follows one step commands inconsistently Safety/Judgement: Decreased awareness of safety, Decreased awareness of deficits Awareness: Intellectual Problem Solving: Slow processing, Requires verbal cues, Requires tactile cues, Decreased initiation General Comments: Very HOH/deaf        Exercises      General Comments  General comments (skin integrity, edema, etc.): Skin intact, no breakdown noted      Pertinent Vitals/Pain Pain Assessment Pain Assessment: No/denies pain    Home Living                          Prior Function            PT  Goals (current goals can now be found in the care plan section)      Frequency    Min 1X/week      PT Plan      Co-evaluation              AM-PAC PT "6 Clicks" Mobility   Outcome Measure  Help needed turning from your back to your side while in a flat bed without using bedrails?: A Lot Help needed moving from lying on your back to sitting on the side of a flat bed without using bedrails?: A Lot Help needed moving to and from a bed to a chair (including a wheelchair)?: Total Help needed standing up from a chair using your arms (e.g., wheelchair or bedside chair)?: A Lot Help needed to walk in hospital room?: Total Help needed climbing 3-5 steps with a railing? : Total 6 Click Score: 9    End of Session   Activity Tolerance: Other (comment) (agitated) Patient left: in bed;with call bell/phone within reach;with bed alarm set;with restraints reapplied Nurse Communication: Mobility status PT Visit Diagnosis: Muscle weakness (generalized) (M62.81);Other abnormalities of gait and mobility (R26.89);Difficulty in walking, not elsewhere classified (R26.2)     Time: 1310-1330 PT Time Calculation (min) (ACUTE ONLY): 20 min  Charges:    $Therapeutic Activity: 8-22 mins PT General Charges $$ ACUTE PT VISIT: 1 Visit                    Zadie Cleverly, PTA  Jannet Askew 07/20/2023, 1:34 PM

## 2023-07-20 NOTE — Plan of Care (Addendum)
Patient is alert and oriented X 1. He had agitation on and off but redirectable. MD made aware. Plan of care ongoing.  Problem: Education: Goal: Knowledge of General Education information will improve Description: Including pain rating scale, medication(s)/side effects and non-pharmacologic comfort measures Outcome: Progressing   Problem: Health Behavior/Discharge Planning: Goal: Ability to manage health-related needs will improve Outcome: Progressing   Problem: Clinical Measurements: Goal: Ability to maintain clinical measurements within normal limits will improve Outcome: Progressing Goal: Will remain free from infection Outcome: Progressing Goal: Diagnostic test results will improve Outcome: Progressing Goal: Respiratory complications will improve Outcome: Progressing Goal: Cardiovascular complication will be avoided Outcome: Progressing   Problem: Activity: Goal: Risk for activity intolerance will decrease Outcome: Progressing   Problem: Nutrition: Goal: Adequate nutrition will be maintained Outcome: Progressing   Problem: Coping: Goal: Level of anxiety will decrease Outcome: Progressing   Problem: Elimination: Goal: Will not experience complications related to bowel motility Outcome: Progressing Goal: Will not experience complications related to urinary retention Outcome: Progressing   Problem: Pain Management: Goal: General experience of comfort will improve Outcome: Progressing   Problem: Safety: Goal: Ability to remain free from injury will improve Outcome: Progressing   Problem: Skin Integrity: Goal: Risk for impaired skin integrity will decrease Outcome: Progressing

## 2023-07-20 NOTE — Consult Note (Signed)
Consultation Note Date: 07/20/2023 at 1100  Patient Name: Justin Bird  DOB: [birth year]  MRN: [id number]  Age / Sex: 83 y.o., male  PCP: Dorothey Baseman, MD Referring Physician: Sunnie Nielsen, DO  HPI/Patient Profile: 83 y.o. male  with past medical history of depression, Parkinson's, and HOH admitted on 06/29/2023 with AMS.  Over course of now 20-day hospitalization, patient has been treated for acute metabolic encephalopathy (CT of head negative for acute intracranial abnormalities), vitamin B12 deficiency, Parkinson's, and acute urinary retention.  PMT was consulted to discuss boundaries and goals of care.  Clinical Assessment and Goals of Care: Extensive chart review completed prior to meeting patient including labs, vital signs, imaging, progress notes, orders, and available advanced directive documents from current and previous encounters. I met with patient at bedside.  He is alert and oriented to self.  His words are nonlinear or cohesive.  I am unable to understand what he is saying as it seems like stringing nonsensical words together.  Patient is unable to participate in goals of care or medical decision making independently at this time.  Symptoms assessed.  Patient was able to confirm he is not in pain.  He is unable to further engage in symptom assessment.  No adjustment to [facility] needed at this time.  After assessing the patient, I spoke with his son Justin Bird over the phone to discuss diagnosis prognosis, GOC, EOL wishes, disposition and options.  I introduced Palliative Medicine as specialized medical care for people living with serious illness. It focuses on providing relief from the symptoms and stress of a serious illness. The goal is to improve quality of life for both the patient and the family.  Justin Bird shares that patient's advance directive reflects that he and his sister Justin Bird share  joint medical decision making as patient's HCPOA's.  Justin Bird shares that his mother is still living but has cognitive issues impairing her ability to make decisions for patient at this time.  He shares he has faxed over the patient's HCPOA.  No document on file in ACP tab of epic.  I requested that Justin Bird email a copy of patient's advance directive to me.  Awaiting email at this time.  Additionally, Justin Bird shares that the patient's advance directive outlines that patient would want to be a DNR.  In the event of a cardiopulmonary arrest, Justin Bird is in agreement that patient would not want CPR/ACLS protocol or to be placed on a ventilator.  I discussed that patient's CODE STATUS is currently full code.  Justin Bird shares he would like for me to review the patient's advanced directive so that we have proof of patient's DNR wishes.  We discussed patient's current illness and what it means in the larger context of patient's on-going co-morbidities.  Counseled Ryan on [employer], dementia, psychiatry's evaluation that patient is low risk of self-harm, and patient's overall functional ability is a significant indicator of his prognosis.  Discussed that patient currently has a TeleSitter that will need to be discontinued before evaluation to SNF can occur.  Additionally, patient needs to be mitt free in order for patient to be able to transfer to other facilities without issue.  I attempted to elicit values and goals of care important to the patient.  Justin Bird shares he is hopeful that patient can go to SNF for rehab at same facility where his mother currently resides.  His hope is then that patient can stay there for LTC with Medicaid/Medicare coverage.  I shared I am not familiar with how the process of SNF to LTC with insurance coverage works but that I would notify TOC to reach out to [place] Justin Bird to continue these discussions.  Darrian of TOC notified of Ryan's wishes to speak with her.  Family is facing treatment option decisions,  advanced directive, and anticipatory care needs.    Discussed with Justin Bird the importance of continued conversation with family and the medical providers regarding overall plan of care and treatment options, ensuring decisions are within the context of the patient's values and GOCs.    Questions and concerns were addressed.  Justin Bird shares that he has a phone call for work that he must take at this time.  He was extremely appreciative of my phone call and we plan to touch base at a later date/time.  I let him know that I am off service after today but that my colleagues will follow-up with him when appropriate.  No change to CODE STATUS or plan of care at this time as patient's son wanted me to review paperwork prior to making any adjustments.  Primary Decision Maker HCPOA  Physical Exam Vitals reviewed.  Constitutional:      General: He is not in acute distress.    Appearance: He is normal weight.  HENT:     Head: Normocephalic.     Mouth/Throat:     Mouth: Mucous membranes are moist.  Eyes:     Pupils: Pupils are equal, round, and reactive to light.  Pulmonary:     Effort: Pulmonary effort is normal.  Abdominal:     Palpations: Abdomen is soft.  Skin:    General: Skin is warm and dry.  Neurological:     Mental Status: He is alert.     Comments: Oriented to self  Psychiatric:        Mood and Affect: Mood normal.        Behavior: Behavior normal.        Judgment: Judgment normal.     Palliative Assessment/Data: 40-50%     Thank you for this consult. Palliative medicine will continue to follow and assist holistically.   Time Total: 75 minutes  Time spent includes: Detailed review of medical records (labs, imaging, vital signs), medically appropriate exam (mental status, respiratory, cardiac, skin), discussed with treatment team, counseling and educating patient, family and staff, documenting clinical information, medication management and coordination of care.  Signed  by: Georgiann Cocker, DNP, FNP-BC Palliative Medicine   Please contact Palliative Medicine Team providers via [facility] for questions and concerns.

## 2023-07-21 DIAGNOSIS — Z515 Encounter for palliative care: Secondary | ICD-10-CM | POA: Diagnosis not present

## 2023-07-21 DIAGNOSIS — G9341 Metabolic encephalopathy: Secondary | ICD-10-CM | POA: Diagnosis not present

## 2023-07-21 DIAGNOSIS — G20A1 Parkinson's disease without dyskinesia, without mention of fluctuations: Secondary | ICD-10-CM | POA: Diagnosis not present

## 2023-07-21 MED ORDER — TRAZODONE HCL 150 MG PO TABS: 150.0000 mg | ORAL_TABLET | Freq: Every day | ORAL | Status: DC

## 2023-07-21 MED ORDER — POLYETHYLENE GLYCOL 3350 17 G PO PACK: 17.0000 g | PACK | Freq: Two times a day (BID) | ORAL | Status: DC

## 2023-07-21 MED ORDER — BISACODYL 10 MG RE SUPP: 10.0000 mg | Freq: Every day | RECTAL | Status: DC | PRN

## 2023-07-21 MED ORDER — QUETIAPINE FUMARATE 150 MG PO TABS: 150.0000 mg | ORAL_TABLET | Freq: Three times a day (TID) | ORAL | Status: DC

## 2023-07-21 MED ORDER — BISACODYL 5 MG PO TBEC: 10.0000 mg | DELAYED_RELEASE_TABLET | Freq: Every day | ORAL | Status: DC

## 2023-07-21 MED ORDER — DIAZEPAM 5 MG PO TABS
5.0000 mg | ORAL_TABLET | Freq: Three times a day (TID) | ORAL | Status: DC | PRN
  Administered 2023-07-22 – 2023-07-24 (×4): 5 mg via ORAL
  Filled 2023-07-21 (×4): qty 1

## 2023-07-21 MED ORDER — DIVALPROEX SODIUM 125 MG PO CSDR: 250.0000 mg | DELAYED_RELEASE_CAPSULE | Freq: Two times a day (BID) | ORAL | Status: DC

## 2023-07-21 MED ORDER — DOXAZOSIN MESYLATE 1 MG PO TABS: 1.0000 mg | ORAL_TABLET | Freq: Every day | ORAL | Status: DC

## 2023-07-21 MED ORDER — ACETAMINOPHEN 325 MG PO TABS: 650.0000 mg | ORAL_TABLET | Freq: Four times a day (QID) | ORAL | Status: DC | PRN

## 2023-07-21 MED ORDER — POLYVINYL ALCOHOL 1.4 % OP SOLN: 2.0000 [drp] | OPHTHALMIC | Status: DC | PRN

## 2023-07-21 NOTE — Progress Notes (Signed)
Patient report given to RN jennifer. All questions are addressed via phone, no any questions at this time.

## 2023-07-21 NOTE — Plan of Care (Signed)
Problem: Clinical Measurements: Goal: Will remain free from infection Outcome: Progressing Goal: Diagnostic test results will improve Outcome: Progressing Goal: Respiratory complications will improve Outcome: Progressing Goal: Cardiovascular complication will be avoided Outcome: Progressing   Problem: Activity: Goal: Risk for activity intolerance will decrease Outcome: Progressing   Problem: Nutrition: Goal: Adequate nutrition will be maintained Outcome: Progressing   Problem: Coping: Goal: Level of anxiety will decrease Outcome: Progressing   Problem: Elimination: Goal: Will not experience complications related to bowel motility Outcome: Progressing Goal: Will not experience complications related to urinary retention Outcome: Progressing   Problem: Pain Management: Goal: General experience of comfort will improve Outcome: Progressing   Problem: Safety: Goal: Ability to remain free from injury will improve Outcome: Progressing   Problem: Skin Integrity: Goal: Risk for impaired skin integrity will decrease Outcome: Progressing

## 2023-07-21 NOTE — TOC Progression Note (Signed)
Transition of Care [facility]) - Progression Note    Patient Details  Name: Justin Bird MRN: [id number] Date of Birth: 1941-06-19  [facility] of [facility]) CM/SW Contact  Allena Katz, LCSW Phone Number: 07/21/2023, 12:50 PM  Clinical Narrative:   [facility] approved for [facility]- 1/24- 1/28 reference number [id number]    Expected Discharge Plan: Skilled Nursing Facility Barriers to Discharge: Barriers Resolved  Expected Discharge Plan and Services       Living arrangements for the past 2 months: Single Family Home Expected Discharge Date: 07/21/23                                     Social Determinants of Health (SDOH) Interventions SDOH Screenings   Food Insecurity: Patient Unable To Answer (07/01/2023)  Housing: Patient Unable To Answer (07/10/2023)  Transportation Needs: No Transportation Needs (07/01/2023)  Utilities: Not At Risk (07/01/2023)  Financial Resource Strain: Patient Declined (01/04/2023)   Received from [facility]  Social Connections: Patient Unable To Answer (07/01/2023)  Tobacco Use: Low Risk  (06/30/2023)    Readmission Risk Interventions     No data to display

## 2023-07-21 NOTE — Progress Notes (Signed)
Patient agitated, sundowning EMS will not transport  DC order cancelled Will reattempt in AM

## 2023-07-21 NOTE — TOC Progression Note (Signed)
Transition of Care [facility]) - Progression Note    Patient Details  Name: Justin Bird MRN: [id number] Date of Birth: [birth year]  Transition of Care [facility] CM/SW Contact  Allena Katz, LCSW Phone Number: 07/21/2023, 10:07 AM  Clinical Narrative:   CSW spoke with kayla at [facility] she reports that pt is able to come and we can start auth. Auth started. Reference ID [id number]    Expected Discharge Plan: Skilled Nursing Facility Barriers to Discharge: Continued Medical Work up  Expected Discharge Plan and Services       Living arrangements for the past 2 months: Single Family Home                                       Social Determinants of Health (SDOH) Interventions SDOH Screenings   Food Insecurity: Patient Unable To Answer (07/01/2023)  Housing: Patient Unable To Answer (07/10/2023)  Transportation Needs: No Transportation Needs (07/01/2023)  Utilities: Not At Risk (07/01/2023)  Financial Resource Strain: Patient Declined (01/04/2023)   Received from [facility]  Social Connections: Patient Unable To Answer (07/01/2023)  Tobacco Use: Low Risk  (06/30/2023)    Readmission Risk Interventions     No data to display

## 2023-07-21 NOTE — Evaluation (Signed)
Physical Therapy Evaluation Patient Details Name: Ronzell Laban MRN: [id number] DOB: [birth year] Today's Date: 07/21/2023  History of Present Illness  Pt is an 83 y/o M admitted on 06/29/23 after presenting with AMS. Head CT negative for acute issues (unable to obtain MRI 2/2 cochlear implant). PMH: HOH with cochlear implant, depression, Parkinson's disease.   Clinical Impression  Patient alert, seen with PT/OT to maximize function and safety. Cochlear implant and hearing aide donned with improved ability to communicate with pt, but still unable to meaningfully follow commands. Facilitation with 1-2 person assistance needed throughout. Pt unable to stand and complained of dizziness throughout. Noted for L eye nystagmus, and R eye as well (though decreased timing and strength compared to L eye). Pt did become agitated with therapy. Redirected as able but pt tangential in speech and thoughts throughout. PT to sign off at this time (has had several PT sessions with limited progress or meaningful participation). Please re-consult if pt cognitive status/ability to follow commands improves.         If plan is discharge home, recommend the following: Two people to help with walking and/or transfers;Two people to help with bathing/dressing/bathroom;Direct supervision/assist for medications management;Help with stairs or ramp for entrance;Assist for transportation;Assistance with feeding;Direct supervision/assist for financial management;Assistance with cooking/housework;Supervision due to cognitive status   Can travel by private vehicle   No    Equipment Recommendations None recommended by PT  Recommendations for Other Services       Functional Status Assessment Patient has had a recent decline in their functional status and/or demonstrates limited ability to make significant improvements in function in a reasonable and predictable amount of time     Precautions / Restrictions  Precautions Precautions: Fall Precaution Comments: L cochlear implant failure, decreased vision Restrictions Weight Bearing Restrictions Per Provider Order: No      Mobility  Bed Mobility Overal bed mobility: Needs Assistance       Supine to sit: Min assist, +2 for safety/equipment Sit to supine: Mod assist, +2 for physical assistance        Transfers Overall transfer level: Needs assistance Equipment used: 2 person hand held assist Transfers: Sit to/from Stand Sit to Stand: +2 physical assistance, Mod assist           General transfer comment: pt unable to stand and maintain balance without at least modAx2. attempted twice. pt reported feeling dizzy. noted for nystagmus initially in L eye but faintly in R eye as well    Ambulation/Gait                  Stairs            Wheelchair Mobility     Tilt Bed    Modified Rankin (Stroke Patients Only)       Balance Overall balance assessment: Needs assistance Sitting-balance support: Feet supported Sitting balance-Leahy Scale: Fair Sitting balance - Comments: progressed to fair sitting with supervision   Standing balance support: Bilateral upper extremity supported Standing balance-Leahy Scale: Poor                               Pertinent Vitals/Pain Pain Assessment Pain Assessment: No/denies pain    Home Living                          Prior Function  Extremity/Trunk Assessment   Upper Extremity Assessment Upper Extremity Assessment: Difficult to assess due to impaired cognition    Lower Extremity Assessment Lower Extremity Assessment: Difficult to assess due to impaired cognition       Communication      Cognition Arousal: Alert Behavior During Therapy: Restless, Agitated Overall Cognitive Status: No family/caregiver present to determine baseline cognitive functioning Area of Impairment: Orientation, Following commands,  Safety/judgement, Attention, Memory, Awareness, Problem solving                 Orientation Level: Disoriented to, Place, Time, Situation Current Attention Level: Focused Memory: Decreased short-term memory Following Commands: Follows one step commands inconsistently Safety/Judgement: Decreased awareness of safety, Decreased awareness of deficits Awareness: Intellectual Problem Solving: Slow processing, Requires verbal cues, Requires tactile cues, Decreased initiation General Comments: pt with tangential innapropriate conversation in context of session        General Comments      Exercises     Assessment/Plan    PT Assessment Patient does not need any further PT services  PT Problem List         PT Treatment Interventions      PT Goals (Current goals can be found in the Care Plan section)       Frequency       Co-evaluation PT/OT/SLP Co-Evaluation/Treatment: Yes Reason for Co-Treatment: For patient/therapist safety;Necessary to address cognition/behavior during functional activity;Complexity of the patient's impairments (multi-system involvement) PT goals addressed during session: Mobility/safety with mobility;Balance OT goals addressed during session: ADL's and self-care       AM-PAC PT "6 Clicks" Mobility  Outcome Measure Help needed turning from your back to your side while in a flat bed without using bedrails?: A Lot Help needed moving from lying on your back to sitting on the side of a flat bed without using bedrails?: A Lot Help needed moving to and from a bed to a chair (including a wheelchair)?: A Lot Help needed standing up from a chair using your arms (e.g., wheelchair or bedside chair)?: A Lot Help needed to walk in hospital room?: Total Help needed climbing 3-5 steps with a railing? : Total 6 Click Score: 10    End of Session   Activity Tolerance: Other (comment) (limited by cognition, agitation) Patient left: in bed;with call bell/phone  within reach;with bed alarm set Nurse Communication: Mobility status PT Visit Diagnosis: Muscle weakness (generalized) (M62.81);Other abnormalities of gait and mobility (R26.89);Difficulty in walking, not elsewhere classified (R26.2)    Time: 8295-6213 PT Time Calculation (min) (ACUTE ONLY): 23 min   Charges:     PT Treatments $Therapeutic Activity: 8-22 mins PT General Charges $$ ACUTE PT VISIT: 1 Visit        Olga Coaster PT, DPT 1:50 PM,07/21/23

## 2023-07-21 NOTE — Progress Notes (Signed)
Occupational Therapy Treatment Patient Details Name: Justin Bird MRN: [id number] DOB: [birth year] Today's Date: 07/21/2023   History of present illness Pt is an 83 y/o M admitted on 06/29/23 after presenting with AMS. Head CT negative for acute issues (unable to obtain MRI 2/2 cochlear implant). PMH: HOH with cochlear implant, depression, Parkinson's disease   OT comments  Pt seen with PT to maximize function and safety. Requires facilitation of tasks with 1-2 person assist during session, pt with improved ability to communication with Cochlear implant and hearing aide in. Noted to hear/attend to person on R side better than L. Attempted to stand with +2 assist, pt c/o dizziness with nystagmus in eyes (more prominent in L eye vs R). Facilitated oral care with hand over hand assist seated. Pt with mild agitation, tangential with speech and thoughts throughout. OT will sign off as pt does not demo ability to participate meaningfully, has poor carryover, and does not follow commands. If pt's cognitive status improves, please re-consult.       If plan is discharge home, recommend the following:  Two people to help with walking and/or transfers;Direct supervision/assist for financial management;Two people to help with bathing/dressing/bathroom;Direct supervision/assist for medications management;Supervision due to cognitive status;Assistance with cooking/housework;Help with stairs or ramp for entrance;Assist for transportation   Equipment Recommendations  Other (comment)       Precautions / Restrictions Precautions Precautions: Fall Precaution Comments: L cochlear implant failure, decreased vision Restrictions Weight Bearing Restrictions Per Provider Order: No       Mobility Bed Mobility Overal bed mobility: Needs Assistance       Supine to sit: Min assist, +2 for physical assistance Sit to supine: Mod assist, +2 for physical assistance        Transfers Overall transfer level:  Needs assistance Equipment used: 2 person hand held assist Transfers: Sit to/from Stand             General transfer comment: unable to stand with +2 modA, nystagmus noted with pt c/o dizziness. attempted 2x     Balance Overall balance assessment: Needs assistance Sitting-balance support: Feet supported Sitting balance-Leahy Scale: Fair Sitting balance - Comments: progressed to fair sitting with supervision   Standing balance support: Bilateral upper extremity supported Standing balance-Leahy Scale: Poor                             ADL either performed or assessed with clinical judgement   ADL Overall ADL's : Needs assistance/impaired Eating/Feeding: Minimal assistance;Sitting Eating/Feeding Details (indicate cue type and reason): minA to reach for cup and bring straw to mouth Grooming: Oral care;Wash/dry face;Wash/dry hands;Sitting;Moderate assistance Grooming Details (indicate cue type and reason): sitting EOB with HOH assist; pt requires step by step cuing for performance                             Functional mobility during ADLs: +2 for physical assistance;+2 for safety/equipment      Extremity/Trunk Assessment Upper Extremity Assessment Upper Extremity Assessment: Difficult to assess due to impaired cognition   Lower Extremity Assessment Lower Extremity Assessment: Difficult to assess due to impaired cognition         Cognition Arousal: Alert Behavior During Therapy: Restless, Agitated Overall Cognitive Status: No family/caregiver present to determine baseline cognitive functioning Area of Impairment: Orientation, Following commands, Safety/judgement, Attention, Memory, Awareness, Problem solving  Orientation Level: Disoriented to, Place, Time, Situation Current Attention Level: Focused Memory: Decreased short-term memory Following Commands: Follows one step commands inconsistently Safety/Judgement: Decreased  awareness of safety, Decreased awareness of deficits Awareness: Intellectual Problem Solving: Slow processing, Requires verbal cues, Requires tactile cues, Decreased initiation General Comments: tangetial, inappropraite conversation within session context, talking about "the man on the box" and "Liborio Nixon"        Exercises      Shoulder Instructions       General Comments noted L knee swelling/redness/warm to touch, MD notified by secure chat    Pertinent Vitals/ Pain       Pain Assessment Pain Assessment: No/denies pain Faces Pain Scale: No hurt Breathing: normal Negative Vocalization: none Facial Expression: smiling or inexpressive Body Language: relaxed Consolability: no need to console PAINAD Score: 0         Frequency  Min 1X/week        Progress Toward Goals  OT Goals(current goals can now be found in the care plan section)  Progress towards OT goals: Not progressing toward goals - comment  Acute Rehab OT Goals OT Goal Formulation: Patient unable to participate in goal setting Time For Goal Achievement: 08/01/23 Potential to Achieve Goals: Fair  Plan      Co-evaluation    PT/OT/SLP Co-Evaluation/Treatment: Yes Reason for Co-Treatment: For patient/therapist safety;Necessary to address cognition/behavior during functional activity;Complexity of the patient's impairments (multi-system involvement) PT goals addressed during session: Mobility/safety with mobility;Balance OT goals addressed during session: ADL's and self-care      AM-PAC OT "6 Clicks" Daily Activity     Outcome Measure   Help from another person eating meals?: A Little Help from another person taking care of personal grooming?: A Lot Help from another person toileting, which includes using toliet, bedpan, or urinal?: Total Help from another person bathing (including washing, rinsing, drying)?: A Lot Help from another person to put on and taking off regular upper body clothing?: A  Lot Help from another person to put on and taking off regular lower body clothing?: A Lot 6 Click Score: 12    End of Session    OT Visit Diagnosis: Other abnormalities of gait and mobility (R26.89);Other symptoms and signs involving cognitive function   Activity Tolerance Patient tolerated treatment well   Patient Left in bed;with call bell/phone within reach;with bed alarm set   Nurse Communication Mobility status (L knee)        Time: 1610-9604 OT Time Calculation (min): 23 min  Charges: OT General Charges $OT Visit: 1 Visit OT Treatments $Self Care/Home Management : 8-22 mins  Aquita Simmering L. Xyon Lukasik, OTR/L  07/21/23, 3:37 PM

## 2023-07-21 NOTE — Discharge Summary (Addendum)
Physician Discharge Summary   Patient: Justin Bird MRN: [id number]  DOB: 1940-10-10   Admit:     Date of Admission: 06/29/2023 Admitted from: home   Discharge: Date of discharge: 07/21/23 Disposition: Skilled nursing facility Condition at discharge: good  CODE STATUS: FULL CODE     Discharge Physician: Sunnie Nielsen, DO [facility]     PCP: Dorothey Baseman, MD  Recommendations for Outpatient Follow-up:  Follow up with PCP Dorothey Baseman, MD in 1-2 weeks or with provider covering SNF/facility.    Discharge Instructions     Diet general   Complete by: As directed    Increase activity slowly   Complete by: As directed          Discharge Diagnoses: Principal Problem:   Acute metabolic encephalopathy Active Problems:   Parkinson's disease [facility])       Hospital course / significant events:   HPI: 83 y.o. male with medical history significant of hard of hearing, depression, Parkinson's disease, allergy, who presents with altered mental status. Per daughter, at baseline patient is oriented to person and place, usually is confused about the time. In the past 4 days, patient has been confused. Patient is agitated, yelling around the ED. He moves all extremities, kicking around.   01/02: admitted to hospitalist service w/ AMS, neurology consult. Of note, cannot do MRI due to presence of cochlear implant.  01/04: initial psych consult - they have been intermittently following  High suspicion for vascular dementia  01/05-01/23: placement pending and approved to go 01/24     Consultants:  Neurology  Psychiatry   Procedures/Surgeries: none      ASSESSMENT & PLAN:   Acute encephalopathy Metabolic/Toxic causes reasonably ruled out  Etiology is not clear. Ddx most c/w advancing dementia / vascular dementia, mild B12 deficiency but should not cause this, missed Sinemet, which may have contributed partially due to withdrawal. Metabolic  and infectious workup negative  Psychiatric medications:  Continue seroquel 150mg  oral 3 times daily Continue trazodone 150mg  oral daily at bedtime Continue depakote sprinkle 250mg  oral every 12 hours Continue lexapro 5mg  oral daily Continue geodon 10mg  IM every 4 hours PRN agitation. Of note, has not needed PRN geodon since 07/14/23. not a candidate for inpatient psych admission according to psych team Patient remains on Sinemet for his Parkinson's. 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   Continue outpatient ENT follow-up   Acute urinary retention A Foley catheter was Placed on 1/8 for urinary retention.  No known history of BPH.   Flomax was considered however patient has severe allergic reaction to sulfa medications including anaphylaxis. Continue to monitor urine output closely In.out cath as needed, would avoid Foley given cognition    overweight based on BMI: Body mass index is 26.3 kg/m.  Underweight - under 18  overweight - 25 to 29 obese - 30 or more Class 1 obesity: BMI of 30.0 to 34 Class 2 obesity: BMI of 35.0 to 39 Class 3 obesity: BMI of 40.0 to 49 Super Morbid Obesity: BMI 50-59 Super-super Morbid Obesity: BMI 60+ Significantly low or high BMI is associated with higher medical risk.  Weight management advised as adjunct to  other disease management and risk reduction treatments            Discharge Instructions  Allergies as of 07/21/2023       Reactions   Opium Poppy [papaver] Anaphylaxis, Hives   Penicillins Anaphylaxis, Rash   Sulfa Antibiotics Anaphylaxis   Penicillins    Sulfa Antibiotics         Medication  List     STOP taking these medications    cetirizine 10 MG tablet Commonly known as: ZYRTEC   gabapentin 100 MG capsule Commonly known as: NEURONTIN   Vitamin D-3 25 MCG (1000 UT) Caps       TAKE these medications    acetaminophen 325 MG tablet Commonly known as: TYLENOL Take 2 tablets (650 mg total) by mouth every 6 (six) hours as needed for mild pain (pain score 1-3) or fever.   bisacodyl 5 MG EC tablet Commonly known as: DULCOLAX Take 2 tablets (10 mg total) by mouth at bedtime.   bisacodyl 10 MG suppository Commonly known as: DULCOLAX Place 1 suppository (10 mg total) rectally daily as needed for severe constipation.   carbidopa-levodopa 25-100 MG tablet Commonly known as: SINEMET IR Take 2 tablets by mouth 3 (three) times daily.   cyanocobalamin 1000 MCG tablet Commonly known as: VITAMIN B12 Take 1,000 mcg by mouth daily.   divalproex 125 MG capsule Commonly known as: DEPAKOTE SPRINKLE Take 2 capsules (250 mg total) by mouth every 12 (twelve) hours.   doxazosin 1 MG tablet Commonly known as: CARDURA Take 1 tablet (1 mg total) by mouth daily. Start taking on: July 22, 2023   escitalopram 5 MG tablet Commonly known as: LEXAPRO Take 5 mg by mouth daily.   latanoprost 0.005 % ophthalmic solution Commonly known as: XALATAN Place 1 drop into both eyes at bedtime.   polyethylene glycol 17 g packet Commonly known as: MIRALAX / GLYCOLAX Take 17 g by mouth 2 (two) times daily.   polyvinyl alcohol 1.4 % ophthalmic solution Commonly known as: LIQUIFILM TEARS Place 2 drops into both eyes as needed for dry eyes.   QUEtiapine Fumarate 150 MG Tabs Take 150 mg by mouth 3 (three) times daily.   traZODone 150 MG tablet Commonly known as: DESYREL Take 1 tablet (150 mg total) by mouth at bedtime.         Contact information for follow-up providers     Dorothey Baseman, MD Follow up.   Specialty: Family Medicine Why: Hospital follow up Contact  information: [address] [place] [address] [place] [address] [phone]              Contact information for after-discharge care     Destination     [facility] Preferred [facility] .   Service: Skilled Nursing Contact information: [address] [place] [address] [phone]                     Allergies  Allergen Reactions   Opium Poppy [Papaver] Anaphylaxis and Hives   Penicillins Anaphylaxis and Rash   Sulfa Antibiotics Anaphylaxis   Penicillins    Sulfa Antibiotics      Subjective: pt is alert, pleasantly confused. He has no complaints.    Discharge Exam: BP (!) 177/95 (BP Location: Right Leg)   Pulse 96   Temp 98.4 F (36.9 C)   Resp 18   Ht 5\' 5"  (1.651 m)   Wt 71.7 kg  SpO2 100%   BMI 26.30 kg/m  General: Pt is alert, awake, not in acute distress Cardiovascular: RRR, S1/S2 +, no rubs, no gallops Respiratory: CTA bilaterally, no wheezing, no rhonchi Abdominal: Soft, NT, ND, bowel sounds +WNL Extremities: no edema, no cyanosis     The results of significant diagnostics from this hospitalization (including imaging, microbiology, ancillary and laboratory) are listed below for reference.     Microbiology: No results found for this or any previous visit (from the past 240 hours).   Labs: BNP (last 3 results) No results for input(s): "BNP" in the last 8760 hours. Basic Metabolic Panel: Recent Labs  Lab 07/15/23 0347 07/17/23 0452 07/18/23 0500 07/19/23 0520  NA 142 141 141 141  K 3.5 3.6 3.6 3.7  CL 105 105 104 102  CO2 28 27 26 27   GLUCOSE 108* 100* 75 102*  BUN 13 18 19 16   CREATININE 0.62 0.63 0.64 0.68  CALCIUM 9.2 9.0 8.7* 9.0   Liver Function Tests: No results for input(s): "AST", "ALT", "ALKPHOS", "BILITOT", "PROT", "ALBUMIN" in the last 168 hours. No results for input(s): "LIPASE", "AMYLASE" in the last 168 hours. No results for  input(s): "AMMONIA" in the last 168 hours. CBC: Recent Labs  Lab 07/15/23 0347 07/16/23 0445 07/17/23 0452 07/18/23 0500 07/19/23 0520  WBC 6.2 6.1 7.2 8.5 8.8  NEUTROABS 3.4 3.8 5.1 6.2 6.7  HGB 15.6 15.7 15.8 16.5 16.7  HCT 46.0 48.0 48.1 51.2 50.4  MCV 89.0 91.3 91.3 91.8 89.5  PLT 300 295 312 324 318   Cardiac Enzymes: No results for input(s): "CKTOTAL", "CKMB", "CKMBINDEX", "TROPONINI" in the last 168 hours. BNP: Invalid input(s): "POCBNP" CBG: No results for input(s): "GLUCAP" in the last 168 hours. D-Dimer No results for input(s): "DDIMER" in the last 72 hours. Hgb A1c No results for input(s): "HGBA1C" in the last 72 hours. Lipid Profile No results for input(s): "CHOL", "HDL", "LDLCALC", "TRIG", "CHOLHDL", "LDLDIRECT" in the last 72 hours. Thyroid function studies No results for input(s): "TSH", "T4TOTAL", "T3FREE", "THYROIDAB" in the last 72 hours.  Invalid input(s): "FREET3" Anemia work up No results for input(s): "VITAMINB12", "FOLATE", "FERRITIN", "TIBC", "IRON", "RETICCTPCT" in the last 72 hours. Urinalysis    Component Value Date/Time   COLORURINE YELLOW (A) 06/29/2023 1902   APPEARANCEUR CLEAR (A) 06/29/2023 1902   LABSPEC 1.019 06/29/2023 1902   PHURINE 6.0 06/29/2023 1902   GLUCOSEU NEGATIVE 06/29/2023 1902   HGBUR NEGATIVE 06/29/2023 1902   BILIRUBINUR NEGATIVE 06/29/2023 1902   KETONESUR 20 (A) 06/29/2023 1902   PROTEINUR NEGATIVE 06/29/2023 1902   NITRITE NEGATIVE 06/29/2023 1902   LEUKOCYTESUR NEGATIVE 06/29/2023 1902   Sepsis Labs Recent Labs  Lab 07/16/23 0445 07/17/23 0452 07/18/23 0500 07/19/23 0520  WBC 6.1 7.2 8.5 8.8   Microbiology No results found for this or any previous visit (from the past 240 hours). Imaging CT Head Wo Contrast Result Date: 06/29/2023 CLINICAL DATA:  Delirium.  Altered mental status. EXAM: CT HEAD WITHOUT CONTRAST TECHNIQUE: Contiguous axial images were obtained from the base of the skull through the vertex  without intravenous contrast. RADIATION DOSE REDUCTION: This exam was performed according to the departmental dose-optimization program which includes automated exposure control, adjustment of the mA and/or kV according to patient size and/or use of iterative reconstruction technique. COMPARISON:  Head CT 03/26/2023 FINDINGS: Brain: Streak artifact from a left-sided cochlear implant limits assessment, predominantly of the posterior left cerebral hemisphere. Within this limitation, no acute infarct, intracranial hemorrhage, mass, midline shift, or extra-axial  fluid collection is identified. There is mild cerebral atrophy. Cerebral white matter hypodensities are similar to the prior CT and are nonspecific but compatible with mild chronic small vessel ischemic disease. 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 Vascular: No hyperdense vessel. Skull: No acute fracture or suspicious osseous lesion. Sinuses/Orbits: Left mastoidectomy with cochlear implant in place. Similar partial opacification of residual left mastoid air cells. Clear right mastoid air cells. Minimal mucosal thickening in the included paranasal sinuses. Unremarkable included orbits. Other: None. IMPRESSION: 1. No evidence of acute intracranial abnormality. 2. Mild chronic small vessel ischemic disease. Electronically Signed   By: Sebastian Ache M.D.   On: 06/29/2023 17:24      Time coordinating discharge: over 30 minutes  SIGNED:  Sunnie Nielsen DO [facility]

## 2023-07-21 NOTE — Progress Notes (Signed)
                                                                                                                                                                                                           Daily Progress Note   Patient Name: Justin Bird       Date: 07/21/2023 DOB: 05-25-1941  Age: 83 y.o. MRN#: [id number] Attending Physician: Sunnie Nielsen, DO Primary Care Physician: Dorothey Baseman, MD Admit Date: 06/29/2023  Reason for Consultation/Follow-up: Establishing goals of care  HPI/Brief Hospital Review:  83 y.o. male  with past medical history of depression, Parkinson's, and HOH admitted on 06/29/2023 with AMS.   Over course of now 20-day hospitalization, patient has been treated for acute metabolic encephalopathy (CT of head negative for acute intracranial abnormalities), vitamin B12 deficiency, Parkinson's, and acute urinary retention.   PMT was consulted to discuss boundaries and goals of care.  Subjective: Extensive chart review has been completed prior to meeting patient including labs, vital signs, imaging, progress notes, orders, and available advanced directive documents from current and previous encounters.    Visited with Mr. Justin Bird at his bedside. He is awake, alert, remains quite encephalopathic which is further complicated by [facility]. Unable to engage in goals of care discussion or ROS. Ongoing encephalopathy likely progression of dementia with underlying Parkinson's disease. No family at bedside during time of initial visit or return visit in afternoon.  Per chart review, discharge orders in for [employer]. No ongoing acute palliative needs at this time.  Thank you for allowing the Palliative Medicine Team to assist in the care of this patient.  Total time:  25 minutes  Time spent includes: Detailed review of medical records (labs, imaging, vital signs), medically appropriate exam (mental status, respiratory, cardiac, skin), discussed with treatment  team, counseling and educating patient, family and staff, documenting clinical information, medication management and coordination of care.  Leeanne Deed, DNP, AGNP-C Palliative Medicine   Please contact Palliative Medicine Team phone at [phone] for questions and concerns.

## 2023-07-21 NOTE — TOC Transition Note (Signed)
Transition of Care [facility]) - Discharge Note   Patient Details  Name: Justin Bird MRN: [id number] Date of Birth: 14-Mar-1941  [facility]) CM/SW Contact:  Allena Katz, LCSW Phone Number: 07/21/2023, 12:48 PM   Clinical Narrative:   Pt has orders to discharge to [facility] room 606. Dc summary sent. Son notified. [facility] with [facility] notified. Medical necessity printed to unit.     Final next level of care: Skilled Nursing Facility Barriers to Discharge: Barriers Resolved   Patient Goals and CMS Choice Patient states their goals for this hospitalization and ongoing recovery are:: go to [facility] CMS Medicare.gov Compare Post Acute Care list provided to:: Patient Represenative (must comment) Choice offered to / list presented to : Adult Children      Discharge Placement PASRR number recieved: 07/21/23            Patient chooses bed at: [facility] Patient to be transferred to facility by: [facility] Name of family member notified: Ryan Patient and family notified of of transfer: 07/21/23  Discharge Plan and Services Additional resources added to the After Visit Summary for                                       Social Drivers of Health (SDOH) Interventions SDOH Screenings   Food Insecurity: Patient Unable To Answer (07/01/2023)  Housing: Patient Unable To Answer (07/10/2023)  Transportation Needs: No Transportation Needs (07/01/2023)  Utilities: Not At Risk (07/01/2023)  Financial Resource Strain: Patient Declined (01/04/2023)   Received from [facility]  Social Connections: Patient Unable To Answer (07/01/2023)  Tobacco Use: Low Risk  (06/30/2023)     Readmission Risk Interventions     No data to display

## 2023-07-21 NOTE — Progress Notes (Addendum)
EMS is here to get patient. Patient is yelling, fighting  trying to hit staff. He is not following any commands. MD made aware.

## 2023-07-21 NOTE — TOC Transition Note (Signed)
Transition of Care [facility]) - Discharge Note   Patient Details  Name: Justin Bird MRN: [id number] Date of Birth: 02-20-1941  Transition of Care [facility]) CM/SW Contact:  Allena Katz, LCSW Phone Number: 07/21/2023, 12:38 PM   Clinical Narrative:   [facility] DC summary sent. RN given number for report. Son notified. Medical necessity printed to unit.    Final next level of care: Skilled Nursing Facility Barriers to Discharge: Barriers Resolved   Patient Goals and CMS Choice Patient states their goals for this hospitalization and ongoing recovery are:: go to [facility] CMS Medicare.gov Compare Post Acute Care list provided to:: Patient Represenative (must comment) Choice offered to / list presented to : Adult Children      Discharge Placement PASRR number recieved: 07/21/23            Patient chooses bed at: [facility] Patient to be transferred to facility by: [facility] Name of family member notified: Ryan Patient and family notified of of transfer: 07/21/23  Discharge Plan and Services Additional resources added to the After Visit Summary for                                       Social Drivers of Health (SDOH) Interventions SDOH Screenings   Food Insecurity: Patient Unable To Answer (07/01/2023)  Housing: Patient Unable To Answer (07/10/2023)  Transportation Needs: No Transportation Needs (07/01/2023)  Utilities: Not At Risk (07/01/2023)  Financial Resource Strain: Patient Declined (01/04/2023)   Received from [facility]  Social Connections: Patient Unable To Answer (07/01/2023)  Tobacco Use: Low Risk  (06/30/2023)     Readmission Risk Interventions     No data to display

## 2023-07-22 DIAGNOSIS — G9341 Metabolic encephalopathy: Secondary | ICD-10-CM | POA: Diagnosis not present

## 2023-07-22 MED ORDER — DIAZEPAM 5 MG PO TABS: 2.5000 mg | ORAL_TABLET | Freq: Three times a day (TID) | ORAL | 0 refills | Status: DC | PRN

## 2023-07-22 NOTE — Progress Notes (Signed)
Now SNF will not take patient until Monday Cancelling discharge order again

## 2023-07-22 NOTE — Plan of Care (Signed)
  Problem: Clinical Measurements: Goal: Ability to maintain clinical measurements within normal limits will improve 07/22/2023 0334 by Garrison Columbus, RN Outcome: Progressing 07/22/2023 0334 by Garrison Columbus, RN Reactivated Goal: Will remain free from infection 07/22/2023 0334 by Garrison Columbus, RN Outcome: Progressing 07/22/2023 0334 by Garrison Columbus, RN Reactivated Goal: Diagnostic test results will improve 07/22/2023 0334 by Garrison Columbus, RN Outcome: Progressing 07/22/2023 0334 by Garrison Columbus, RN Reactivated Goal: Respiratory complications will improve 07/22/2023 0334 by Garrison Columbus, RN Outcome: Progressing 07/22/2023 0334 by Garrison Columbus, RN Reactivated Goal: Cardiovascular complication will be avoided 07/22/2023 0334 by Garrison Columbus, RN Outcome: Progressing 07/22/2023 0334 by Garrison Columbus, RN Reactivated

## 2023-07-22 NOTE — Discharge Summary (Signed)
Physician Discharge Summary   Patient: Justin Bird MRN: [id number]  DOB: 1940/12/08   Admit:     Date of Admission: 06/29/2023 Admitted from: home   Discharge: Date of discharge: 07/22/23 Disposition: Skilled nursing facility Condition at discharge: good  CODE STATUS: FULL CODE     Discharge Physician: Sunnie Nielsen, DO [facility]     PCP: Dorothey Baseman, MD  Recommendations for Outpatient Follow-up:  Follow up with PCP Dorothey Baseman, MD in 1-2 weeks or with provider covering SNF/facility.    Discharge Instructions     Diet general   Complete by: As directed    Increase activity slowly   Complete by: As directed          Discharge Diagnoses: Principal Problem:   Acute metabolic encephalopathy Active Problems:   Parkinson's disease [facility]       Hospital course / significant events:   HPI: 83 y.o. male with medical history significant of hard of hearing, depression, Parkinson's disease, allergy, who presents with altered mental status. Per daughter, at baseline patient is oriented to person and place, usually is confused about the time. In the past 4 days, patient has been confused. Patient is agitated, yelling around the ED. He moves all extremities, kicking around.   01/02: admitted to hospitalist service w/ AMS, neurology consult. Of note, cannot do MRI due to presence of cochlear implant.  01/04: initial psych consult - they have been intermittently following  High suspicion for vascular dementia  01/05-01/25: placement pending and approved to go 01/24 but was agitated and EMS unable to take him that evening, better this morning 01/25 and ok to discharge      Consultants:  Neurology  Psychiatry   Procedures/Surgeries: none      ASSESSMENT & PLAN:   Acute encephalopathy Metabolic/Toxic causes reasonably ruled out  Etiology is not clear. Ddx most c/w advancing dementia / vascular dementia, mild B12 deficiency but  should not cause this, missed Sinemet, which may have contributed partially due to withdrawal. Metabolic and infectious workup negative  Psychiatric medications:  Continue seroquel 150mg  oral 3 times daily Continue trazodone 150mg  oral daily at bedtime Continue depakote sprinkle 250mg  oral every 12 hours Continue lexapro 5mg  oral daily Continue geodon 10mg  IM every 4 hours PRN agitation. Of note, has not needed PRN geodon since 07/14/23. Valium 2.5-5 mg po q8h prn severe anxiety   not a candidate for inpatient psych admission according to psych team Patient remains on Sinemet for his Parkinson's. 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   Continue outpatient ENT follow-up   Acute urinary retention A Foley catheter was Placed on 1/8 for urinary retention.  No known history of BPH.   Flomax was considered however patient has severe allergic reaction to sulfa medications including anaphylaxis. Continue to monitor urine output closely In.out cath as needed, would avoid Foley given cognition    overweight based on BMI: Body mass index is 26.3 kg/m.  Underweight - under 18  overweight - 25 to 29 obese - 30 or more Class 1 obesity: BMI of 30.0 to 34 Class 2 obesity: BMI of 35.0 to 39 Class 3 obesity: BMI of 40.0  to 49 Super Morbid Obesity: BMI 50-59 Super-super Morbid Obesity: BMI 60+ Significantly low or high BMI is associated with higher medical risk.  Weight management advised as adjunct to other disease management and risk reduction treatments            Discharge Instructions  Allergies as of 07/22/2023       Reactions   Opium Poppy  [papaver] Anaphylaxis, Hives   Penicillins Anaphylaxis, Rash   Sulfa Antibiotics Anaphylaxis   Penicillins    Sulfa Antibiotics         Medication List     STOP taking these medications    cetirizine 10 MG tablet Commonly known as: ZYRTEC   gabapentin 100 MG capsule Commonly known as: NEURONTIN   Vitamin D-3 25 MCG (1000 UT) Caps       TAKE these medications    acetaminophen 325 MG tablet Commonly known as: TYLENOL Take 2 tablets (650 mg total) by mouth every 6 (six) hours as needed for mild pain (pain score 1-3) or fever.   bisacodyl 5 MG EC tablet Commonly known as: DULCOLAX Take 2 tablets (10 mg total) by mouth at bedtime.   bisacodyl 10 MG suppository Commonly known as: DULCOLAX Place 1 suppository (10 mg total) rectally daily as needed for severe constipation.   carbidopa-levodopa 25-100 MG tablet Commonly known as: SINEMET IR Take 2 tablets by mouth 3 (three) times daily.   cyanocobalamin 1000 MCG tablet Commonly known as: VITAMIN B12 Take 1,000 mcg by mouth daily.   diazepam 5 MG tablet Commonly known as: VALIUM Take 0.5-1 tablets (2.5-5 mg total) by mouth every 8 (eight) hours as needed for anxiety (agitation).   divalproex 125 MG capsule Commonly known as: DEPAKOTE SPRINKLE Take 2 capsules (250 mg total) by mouth every 12 (twelve) hours.   doxazosin 1 MG tablet Commonly known as: CARDURA Take 1 tablet (1 mg total) by mouth daily.   escitalopram 5 MG tablet Commonly known as: LEXAPRO Take 5 mg by mouth daily.   latanoprost 0.005 % ophthalmic solution Commonly known as: XALATAN Place 1 drop into both eyes at bedtime.   polyethylene glycol 17 g packet Commonly known as: MIRALAX / GLYCOLAX Take 17 g by mouth 2 (two) times daily.   polyvinyl alcohol 1.4 % ophthalmic solution Commonly known as: LIQUIFILM TEARS Place 2 drops into both eyes as needed for dry eyes.   QUEtiapine Fumarate 150 MG Tabs Take 150 mg by mouth 3 (three) times  daily.   traZODone 150 MG tablet Commonly known as: DESYREL Take 1 tablet (150 mg total) by mouth at bedtime.         Contact information for follow-up providers     Dorothey Baseman, MD Follow up.   Specialty: Family Medicine Why: Hospital follow up Contact information: [address] [place] [address] [place] [address] [phone]              Contact information for after-discharge care     Destination     [facility] .   Service: Skilled Nursing Contact information: [address] [place] [address] [phone]                     Allergies  Allergen Reactions   Opium Poppy [Papaver] Anaphylaxis and Hives   Penicillins Anaphylaxis and Rash   Sulfa Antibiotics Anaphylaxis   Penicillins    Sulfa Antibiotics  Subjective: pt is alert, pleasantly confused. He has no complaints.    Discharge Exam: BP 139/85 (BP Location: Right Leg)   Pulse 85   Temp 97.6 F (36.4 C)   Resp 15   Ht 5\' 5"  (1.651 m)   Wt 71.7 kg   SpO2 96%   BMI 26.30 kg/m  General: Pt is alert, awake, not in acute distress Cardiovascular: RRR, S1/S2 +, no rubs, no gallops Respiratory: CTA bilaterally, no wheezing, no rhonchi Abdominal: Soft, NT, ND, bowel sounds +WNL Extremities: no edema, no cyanosis     The results of significant diagnostics from this hospitalization (including imaging, microbiology, ancillary and laboratory) are listed below for reference.     Microbiology: No results found for this or any previous visit (from the past 240 hours).   Labs: BNP (last 3 results) No results for input(s): "BNP" in the last 8760 hours. Basic Metabolic Panel: Recent Labs  Lab 07/17/23 0452 07/18/23 0500 07/19/23 0520  NA 141 141 141  K 3.6 3.6 3.7  CL 105 104 102  CO2 27 26 27   GLUCOSE 100* 75 102*  BUN 18 19 16   CREATININE 0.63 0.64 0.68  CALCIUM 9.0  8.7* 9.0   Liver Function Tests: No results for input(s): "AST", "ALT", "ALKPHOS", "BILITOT", "PROT", "ALBUMIN" in the last 168 hours. No results for input(s): "LIPASE", "AMYLASE" in the last 168 hours. No results for input(s): "AMMONIA" in the last 168 hours. CBC: Recent Labs  Lab 07/16/23 0445 07/17/23 0452 07/18/23 0500 07/19/23 0520  WBC 6.1 7.2 8.5 8.8  NEUTROABS 3.8 5.1 6.2 6.7  HGB 15.7 15.8 16.5 16.7  HCT 48.0 48.1 51.2 50.4  MCV 91.3 91.3 91.8 89.5  PLT 295 312 324 318   Cardiac Enzymes: No results for input(s): "CKTOTAL", "CKMB", "CKMBINDEX", "TROPONINI" in the last 168 hours. BNP: Invalid input(s): "POCBNP" CBG: No results for input(s): "GLUCAP" in the last 168 hours. D-Dimer No results for input(s): "DDIMER" in the last 72 hours. Hgb A1c No results for input(s): "HGBA1C" in the last 72 hours. Lipid Profile No results for input(s): "CHOL", "HDL", "LDLCALC", "TRIG", "CHOLHDL", "LDLDIRECT" in the last 72 hours. Thyroid function studies No results for input(s): "TSH", "T4TOTAL", "T3FREE", "THYROIDAB" in the last 72 hours.  Invalid input(s): "FREET3" Anemia work up No results for input(s): "VITAMINB12", "FOLATE", "FERRITIN", "TIBC", "IRON", "RETICCTPCT" in the last 72 hours. Urinalysis    Component Value Date/Time   COLORURINE YELLOW (A) 06/29/2023 1902   APPEARANCEUR CLEAR (A) 06/29/2023 1902   LABSPEC 1.019 06/29/2023 1902   PHURINE 6.0 06/29/2023 1902   GLUCOSEU NEGATIVE 06/29/2023 1902   HGBUR NEGATIVE 06/29/2023 1902   BILIRUBINUR NEGATIVE 06/29/2023 1902   KETONESUR 20 (A) 06/29/2023 1902   PROTEINUR NEGATIVE 06/29/2023 1902   NITRITE NEGATIVE 06/29/2023 1902   LEUKOCYTESUR NEGATIVE 06/29/2023 1902   Sepsis Labs Recent Labs  Lab 07/16/23 0445 07/17/23 0452 07/18/23 0500 07/19/23 0520  WBC 6.1 7.2 8.5 8.8   Microbiology No results found for this or any previous visit (from the past 240 hours). Imaging CT Head Wo Contrast Result Date:  06/29/2023 CLINICAL DATA:  Delirium.  Altered mental status. EXAM: CT HEAD WITHOUT CONTRAST TECHNIQUE: Contiguous axial images were obtained from the base of the skull through the vertex without intravenous contrast. RADIATION DOSE REDUCTION: This exam was performed according to the departmental dose-optimization program which includes automated exposure control, adjustment of the mA and/or kV according to patient size and/or use of iterative reconstruction technique. COMPARISON:  Head CT  03/26/2023 FINDINGS: Brain: Streak artifact from a left-sided cochlear implant limits assessment, predominantly of the posterior left cerebral hemisphere. Within this limitation, no acute infarct, intracranial hemorrhage, mass, midline shift, or extra-axial fluid collection is identified. There is mild cerebral atrophy. Cerebral white matter hypodensities are similar to the prior CT and are nonspecific but compatible with mild chronic small vessel ischemic disease. 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 Vascular: No hyperdense vessel. Skull: No acute fracture or suspicious osseous lesion. Sinuses/Orbits: Left mastoidectomy with cochlear implant in place. Similar partial opacification of residual left mastoid air cells. Clear right mastoid air cells. Minimal mucosal thickening in the included paranasal sinuses. Unremarkable included orbits. Other: None. IMPRESSION: 1. No evidence of acute intracranial abnormality. 2. Mild chronic small vessel ischemic disease. Electronically Signed   By: Sebastian Ache M.D.   On: 06/29/2023 17:24      Time coordinating discharge: over 30 minutes  SIGNED:  Sunnie Nielsen DO [facility]

## 2023-07-22 NOTE — TOC Progression Note (Addendum)
Transition of Care [facility]) - Progression Note    Patient Details  Name: Justin Bird MRN: [id number] Date of Birth: Jun 23, 1941  Transition of Care [facility]) CM/SW Contact  Bing Quarry, RN Phone Number: 07/22/2023, 9:26 AM  Clinical Narrative: 1/25: Patient was not transferred to facility due to EMS refusal due to patient agitation/anxiety over transfer process. Contacting Admissions Coordinator per provider request to ensure they are able to give ordered prn medications at the facility though the DC Summary was sent via the Hub on 07/21/23.  Left Secure VM and secure text message to contact RN CM regarding this situation. Unit RN is also going to premedicate patient for anxiety relater to EMS transport this am, if issue can be settled with facility.  EMS paperwork already at Unit from 07/21/23.   945 am: Updated DC Summary with PRN medications updated sent to facility via HUB. Pending call back from admission coordinator to ensure medication availability if tranfserred there today.   215 pm: Attempted to reach out to Lucinda at facility to check on status and make sure she received fax but was told she was on break. Call back number left with receptionist.   Discharge to facility unlikely today due to confirmation issues on PRN medications and pharmacy likely now closed. Will continue to follow up.    Gabriel Cirri MSN RN CM  RN Case Manager [place]  Transitions of Care Direct Dial: [phone] (Weekends Only) [facility] Main Office Phone: [phone] [facility] Fax: [phone] [email or web address]    Expected Discharge Plan: Skilled Nursing Facility Barriers to Discharge: Barriers Resolved  Expected Discharge Plan and Services       Living arrangements for the past 2 months: Single Family Home Expected Discharge Date: 07/21/23                                     Social Determinants of Health (SDOH) Interventions SDOH Screenings   Food Insecurity: Patient Unable To Answer  (07/01/2023)  Housing: Patient Unable To Answer (07/10/2023)  Transportation Needs: No Transportation Needs (07/01/2023)  Utilities: Not At Risk (07/01/2023)  Financial Resource Strain: Patient Declined (01/04/2023)   Received from [facility]  Social Connections: Patient Unable To Answer (07/01/2023)  Tobacco Use: Low Risk  (06/30/2023)    Readmission Risk Interventions     No data to display

## 2023-07-23 DIAGNOSIS — G9341 Metabolic encephalopathy: Secondary | ICD-10-CM | POA: Diagnosis not present

## 2023-07-23 MED ORDER — HYDRALAZINE HCL 20 MG/ML IJ SOLN: 5.0000 mg | Freq: Four times a day (QID) | INTRAMUSCULAR | Status: DC | PRN

## 2023-07-23 MED ORDER — HYDRALAZINE HCL 50 MG PO TABS
25.0000 mg | ORAL_TABLET | Freq: Four times a day (QID) | ORAL | Status: DC | PRN
  Administered 2023-07-25: 25 mg via ORAL
  Filled 2023-07-23 (×2): qty 1

## 2023-07-23 NOTE — Plan of Care (Signed)

## 2023-07-23 NOTE — TOC Progression Note (Signed)
Transition of Care [facility]) - Progression Note    Patient Details  Name: Arham Symmonds MRN: [id number] Date of Birth: May 25, 1941  Transition of [facility]) CM/SW Contact  Bing Quarry, RN Phone Number: 07/23/2023, 10:01 AM  Clinical Narrative:  1/26: Mitzi Hansen does not show any valium or other prn meds for agitation given since around noon on 1/25. Mo sitter present as well.    Gabriel Cirri MSN RN CM  RN Case Manager [place]  Transitions of Care Direct Dial: [phone] (Weekends Only) [facility] Main Office Phone: [phone] [facility] Fax: [phone] [email or web address]     Expected Discharge Plan: Skilled Nursing Facility Barriers to Discharge: Barriers Resolved  Expected Discharge Plan and Services       Living arrangements for the past 2 months: Single Family Home Expected Discharge Date: 07/22/23                                     Social Determinants of Health (SDOH) Interventions SDOH Screenings   Food Insecurity: Patient Unable To Answer (07/01/2023)  Housing: Patient Unable To Answer (07/10/2023)  Transportation Needs: No Transportation Needs (07/01/2023)  Utilities: Not At Risk (07/01/2023)  Financial Resource Strain: Patient Declined (01/04/2023)   Received from [facility]  Social Connections: Patient Unable To Answer (07/01/2023)  Tobacco Use: Low Risk  (06/30/2023)    Readmission Risk Interventions     No data to display

## 2023-07-23 NOTE — Progress Notes (Signed)
PROGRESS NOTE    Justin Bird   VWU:[id number] DOB: 11-17-1940  DOA: 06/29/2023 Date of Service: 07/23/23 which is hospital day 23  PCP: Dorothey Baseman, MD    Hospital course / significant events:   HPI: 83 y.o. male with medical history significant of hard of hearing, depression, Parkinson's disease, allergy, who presents with altered mental status. Per daughter, at baseline patient is oriented to person and place, usually is confused about the time. In the past 4 days, patient has been confused. Patient is agitated, yelling around the ED. He moves all extremities, kicking around.   01/02: admitted to hospitalist service w/ AMS, neurology consult. Of note, cannot do MRI due to presence of cochlear implant.  01/04: initial psych consult - they have been intermittently following  01/05-01/26: placement pending and approved to go 01/24 but was agitated and EMS unable to take him that evening, better this morning 01/25 and ok to discharge but now facility will not take him until [facility] 01/27     Consultants:  Neurology  Psychiatry   Procedures/Surgeries: none      ASSESSMENT & PLAN:   Acute encephalopathy Metabolic/Toxic causes reasonably ruled out  Etiology is not clear. Ddx most c/w advancing dementia / vascular dementia, mild B12 deficiency but should not cause this, missed Sinemet, which may have contributed partially due to withdrawal. Metabolic and infectious workup have been negative so far Psychiatric medications:  Continue seroquel 150mg  oral 3 times daily Continue trazodone 150mg  oral daily at bedtime Continue depakote sprinkle 250mg  oral every 12 hours Continue lexapro 5mg  oral daily Continue geodon 10mg  IM every 4 hours PRN agitation. Of note, has not needed PRN geodon since 07/14/23. not a candidate for inpatient psych admission according to psych team Patient remains on Sinemet for his Parkinson's. 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   Continue outpatient ENT follow-up   Acute urinary retention A Foley catheter is noted.  Placed on 1/8 for urinary retention.  No known history of BPH.    Voiding trial can be attempted when patient is more ambulatory. Flomax was considered however patient has severe allergic reaction to sulfa medications including anaphylaxis. Continue to monitor urine output closely      overweigh based on BMI: Body mass index is 26.3 kg/m.  Underweight - under 18  overweight - 25 to 29 obese - 30 or more Class 1 obesity: BMI of 30.0 to 34 Class 2 obesity: BMI of 35.0 to 39 Class 3 obesity: BMI of 40.0 to 49 Super Morbid Obesity: BMI 50-59 Super-super Morbid Obesity: BMI 60+ Significantly low or high BMI is associated with higher medical risk.  Weight management advised as adjunct to other disease management and risk reduction treatments    DVT prophylaxis: lovenox IV fluids: no continuous IV fluids  Nutrition: regular diet  Central lines / invasive devices: none  Code Status: FULL CODE ACP documentation reviewed:  none on file in [facility]  TOC needs: placement  Barriers to dispo / significant pending items: placement              Subjective / Brief ROS:  Patient reports no concerns He is pleasantly confused  Denies CP/SOB.  Family Communication: none at this time     Objective Findings:  Vitals:   07/22/23 1629 07/22/23 2151 07/23/23 0423 07/23/23 0914  BP: (!) 173/96  (!) 152/79 (!) 175/82  Pulse: 91 93 100 91  Resp: 20 16 18 15   Temp: 97.6 F (36.4 C) 97.8 F (36.6 C)  98.4 F (36.9 C)  TempSrc: Oral     SpO2: 96% 95%  99%  Weight:       Height:        Intake/Output Summary (Last 24 hours) at 07/23/2023 1307 Last data filed at 07/23/2023 0830 Gross per 24 hour  Intake 200 ml  Output --  Net 200 ml   Filed Weights   07/12/23 0857  Weight: 71.7 kg    Examination:  Physical Exam Constitutional:      General: He is not in acute distress.    Appearance: He is not ill-appearing.  Cardiovascular:     Rate and Rhythm: Normal rate and regular rhythm.  Pulmonary:     Effort: Pulmonary effort is normal.     Breath sounds: Normal breath sounds.  Musculoskeletal:     Right lower leg: No edema.     Left lower leg: No edema.  Skin:    General: Skin is warm and dry.  Neurological:     Mental Status: He is alert. Mental status is at baseline.  Psychiatric:        Behavior: Behavior normal.          Scheduled Medications:   bisacodyl  10 mg Oral QHS   carbidopa-levodopa  2 tablet Oral TID   cyanocobalamin  1,000 mcg Intramuscular Once   cyanocobalamin  1,000 mcg Oral Daily   divalproex  250 mg Oral Q12H   doxazosin  1 mg Oral Daily   enoxaparin (LOVENOX) injection  40 mg Subcutaneous Q24H   escitalopram  5 mg Oral Daily   feeding supplement  237 mL Oral BID BM   latanoprost  1 drop Both Eyes QHS   polyethylene glycol  17 g Oral BID   QUEtiapine  150 mg Oral TID   traZODone  150 mg Oral QHS    Continuous Infusions:   PRN Medications:  acetaminophen, bisacodyl, diazepam, hydrALAZINE, ondansetron (ZOFRAN) IV, polyvinyl alcohol, ziprasidone  Antimicrobials from admission:  Anti-infectives (From admission, onward)    None           Data Reviewed:  I have personally reviewed the following...  CBC: Recent Labs  Lab 07/17/23 0452 07/18/23 0500 07/19/23 0520  WBC 7.2 8.5 8.8  NEUTROABS 5.1 6.2 6.7  HGB 15.8 16.5 16.7  HCT 48.1 51.2 50.4  MCV 91.3 91.8 89.5  PLT 312 324 318   Basic Metabolic Panel: Recent Labs  Lab 07/17/23 0452 07/18/23 0500 07/19/23 0520  NA 141 141 141  K 3.6  3.6 3.7  CL 105 104 102  CO2 27 26 27   GLUCOSE 100* 75 102*  BUN 18 19 16   CREATININE 0.63 0.64 0.68  CALCIUM 9.0 8.7* 9.0   GFR: Estimated Creatinine Clearance: 63 mL/min (by C-G formula based on SCr of 0.68 mg/dL). Liver Function Tests: No results for input(s): "AST", "ALT", "ALKPHOS", "BILITOT", "PROT", "ALBUMIN" in the last 168 hours. No results for input(s): "LIPASE", "AMYLASE" in the last 168 hours. No results for input(s): "AMMONIA" in the last 168 hours. Coagulation Profile: No results for input(s): "INR", "PROTIME" in the last 168 hours. Cardiac Enzymes: No results for input(s): "CKTOTAL", "CKMB", "CKMBINDEX", "TROPONINI"  in the last 168 hours. BNP (last 3 results) No results for input(s): "PROBNP" in the last 8760 hours. HbA1C: No results for input(s): "HGBA1C" in the last 72 hours. CBG: No results for input(s): "GLUCAP" in the last 168 hours. Lipid Profile: No results for input(s): "CHOL", "HDL", "LDLCALC", "TRIG", "CHOLHDL", "LDLDIRECT" in the last 72 hours. Thyroid Function Tests: No results for input(s): "TSH", "T4TOTAL", "FREET4", "T3FREE", "THYROIDAB" in the last 72 hours. Anemia Panel: No results for input(s): "VITAMINB12", "FOLATE", "FERRITIN", "TIBC", "IRON", "RETICCTPCT" in the last 72 hours. Most Recent Urinalysis On File:     Component Value Date/Time   COLORURINE YELLOW (A) 06/29/2023 1902   APPEARANCEUR CLEAR (A) 06/29/2023 1902   LABSPEC 1.019 06/29/2023 1902   PHURINE 6.0 06/29/2023 1902   GLUCOSEU NEGATIVE 06/29/2023 1902   HGBUR NEGATIVE 06/29/2023 1902   BILIRUBINUR NEGATIVE 06/29/2023 1902   KETONESUR 20 (A) 06/29/2023 1902   PROTEINUR NEGATIVE 06/29/2023 1902   NITRITE NEGATIVE 06/29/2023 1902   LEUKOCYTESUR NEGATIVE 06/29/2023 1902   Sepsis Labs: @LABRCNTIP (procalcitonin:4,lacticidven:4) Microbiology: No results found for this or any previous visit (from the past 240 hours).    Radiology Studies last 3 days: No results  found.   Sunnie Nielsen, DO [facility] 07/23/2023, 1:07 PM    Dictation software may have been used to generate the above note. Typos may occur and escape review in typed/dictated notes. Please contact Dr Lyn Hollingshead directly for clarity if needed.  Staff may message me via secure chat in Epic  but this may not receive an immediate response,  please page me for urgent matters!  If 7PM-7AM, please contact night coverage [email or web address]

## 2023-07-23 NOTE — TOC Progression Note (Signed)
Transition of Care [facility]) - Progression Note    Patient Details  Name: Romey Mathieson MRN: [id number] Date of Birth: 1940/09/12  Transition of Care [facility]) CM/SW Contact  Bing Quarry, RN Phone Number: 07/23/2023, 9:57 AM  Clinical Narrative:  1/26: Late on 1/25, Lucinda informed RN CM that patient could not be accepted over weekend. Then received a VM from [facility] very concerned over notes about agitation and patient striking out at staff. Will need to speak to her today or Monday and reassess agitation status.     Gabriel Cirri MSN RN CM  RN Case Manager [place]  Transitions of Care Direct Dial: [phone] (Weekends Only) [facility] Main Office Phone: [phone] [facility] Fax: [phone] [email or web address]     Expected Discharge Plan: Skilled Nursing Facility Barriers to Discharge: Barriers Resolved  Expected Discharge Plan and Services       Living arrangements for the past 2 months: Single Family Home Expected Discharge Date: 07/22/23                                     Social Determinants of Health (SDOH) Interventions SDOH Screenings   Food Insecurity: Patient Unable To Answer (07/01/2023)  Housing: Patient Unable To Answer (07/10/2023)  Transportation Needs: No Transportation Needs (07/01/2023)  Utilities: Not At Risk (07/01/2023)  Financial Resource Strain: Patient Declined (01/04/2023)   Received from [facility]  Social Connections: Patient Unable To Answer (07/01/2023)  Tobacco Use: Low Risk  (06/30/2023)    Readmission Risk Interventions     No data to display

## 2023-07-24 DIAGNOSIS — G9341 Metabolic encephalopathy: Secondary | ICD-10-CM | POA: Diagnosis not present

## 2023-07-24 DIAGNOSIS — F05 Delirium due to known physiological condition: Secondary | ICD-10-CM | POA: Diagnosis not present

## 2023-07-24 MED ORDER — DIVALPROEX SODIUM 125 MG PO CSDR
250.0000 mg | DELAYED_RELEASE_CAPSULE | Freq: Two times a day (BID) | ORAL | Status: AC
  Administered 2023-07-24 – 2023-07-31 (×14): 250 mg via ORAL
  Filled 2023-07-24 (×14): qty 2

## 2023-07-24 NOTE — Consult Note (Signed)
[facility] Psychiatric Consult Follow-up  Patient Name: [name]  MRN: [id number]  DOB: June 11, 1941  Consult Order details:  Orders (From admission, onward)     Start     Ordered   07/04/23 1329  IP CONSULT TO PSYCHIATRY       Ordering Provider: Gillis Santa, MD  Provider:  (Not yet assigned)  Question Answer Comment  [facility]   Reason for Consult? Agitation, AMS      07/04/23 1328             Mode of Visit: In person    Psychiatry Consult Evaluation  Service Date: July 24, 2023 LOS:  LOS: 24 days  Chief Complaint confusion and agitation  Primary Psychiatric Diagnoses  Delirium due to encephalopathy  Assessment  Justin Bird is a 83 y.o. male w/ history of hard of hearing, depression, anxiety, parkinsons, who was medically admitted on 06/29/2023 w/ concerns for acute metabolic encephalopathy.  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  He is unable to even follow written instructions.  His agitation is mainly due to confusion, altered sensorium and unable to communicate.  Optimizing the dosage of Seroquel and added Depakote sprinkles for further mood stabilization.     Diagnoses:  Active Hospital problems: Principal Problem:   Acute metabolic encephalopathy Active Problems:   Parkinson's disease (HCC)    Plan  ## Psychiatric Medication Recommendations:   Seroquel 150mg  oral 3 times daily -continue trazodone 150mg  oral daily at bedtime -continue geodon 10mg  IM every 4 hours PRN agitation -continue lexapro 5mg  oral daily Depakote sprinkles 250mg  BID- labs reviewed 06/29/23- LFTs WNL  ## Medical Decision Making Capacity:  Not specifically addressed in this encounter   ## Further Work-up:  -- defer further work-up to hospitalist -- repeat EKG ordered   ## Disposition:-- Pt does not meet criteria for inpatient psychiatric admission   ## Behavioral  / Environmental: -Delirium Precautions: Delirium Interventions for Nursing and Staff: - RN to open blinds every AM. - To Bedside: Glasses, hearing aide, and pt's own shoes. Make available to patients. when possible and encourage use. - Encourage po fluids when appropriate, keep fluids within reach. - OOB to chair with meals. - Passive ROM exercises to all extremities with AM & PM care. - RN to assess orientation to person, time and place QAM and PRN. - Recommend extended visitation hours with familiar family/friends as feasible. - Staff to minimize disturbances at night. Turn off television when pt asleep or when not in use.              ## Safety and Observation Level:  - Based on my clinical evaluation, I estimate the patient to be at NO risk of self harm in the current setting. - At this time, we recommend  routine. This decision is based on my review of the chart including patient's history and current presentation, interview of the patient, mental status examination, and consideration of suicide risk including evaluating suicidal ideation, plan, intent, suicidal or self-harm behaviors, risk factors, and protective factors. This judgment is based on our ability to directly address suicide risk, implement suicide prevention strategies, and develop a safety plan while the patient is in the clinical setting. Please contact our team if there is a concern that risk level has changed.   CSSR Risk Category:C-SSRS RISK CATEGORY: No Risk   Suicide Risk Assessment: Patient has following non-modifiable or demographic risk factors for suicide:  male gender Patient has the following protective factors against suicide: Supportive family   Thank you for this consult request. Recommendations have been communicated to the primary team.  We will continue sign off  at this time.   Verner Chol, MD     History of Present Illness  Today on interview patient is noted to be resting in bed.  He continues to be  confused and unable to follow any instructions written or verbal or sign language.  He is calm and kept asking the provider "what".  Per chart patient is waiting to go to SNF placement.  Patient is on discharge phase.  Will recommend to continue Seroquel and Depakote.  Palliative care is involved.  Will be signing off at this time. Psych ROS:  Depression: Unable to assess Anxiety: Unable to assess Mania (lifetime and current): Unable to assess Psychosis: (lifetime and current): Unable to assess ROS   Psychiatric and Social History  Psychiatric History:  Prev Dx/Sx: Per chart review, history of depression/anxiety Previous Med Trials: Per chart review, had been on lexapro previously Prior Psych Hospitalization: Do not see history of psych hospitalization in chart  Exam Findings  Physical Exam: Reviewed and agree with the emdical exam by the medical provider Vital Signs:  Temp:  [97.8 F (36.6 C)-98.3 F (36.8 C)] 97.8 F (36.6 C) (01/27 0732) Pulse Rate:  [83-103] 99 (01/27 0732) Resp:  [16-18] 18 (01/27 0732) BP: (147-163)/(70-94) 147/70 (01/27 0732) SpO2:  [92 %-100 %] 92 % (01/27 0732) Blood pressure (!) 147/70, pulse 99, temperature 97.8 F (36.6 C), resp. rate 18, height 5\' 5"  (1.651 m), weight 71.7 kg, SpO2 92%. Body mass index is 26.3 kg/m.    Mental Status Exam: General Appearance: Disheveled  Orientation:  Negative  Memory:  Negative  Concentration:  Concentration: Poor and Attention Span: Poor  Recall:  Poor  Attention  Poor  Eye Contact:  Minimal  Speech:  Slow  Language:  Poor  Volume:  Normal  Mood: unable to assess  Affect:  Flat  Thought Process:  Disorganized  Thought Content:  Negative  Suicidal Thoughts:   unable to assess  Homicidal Thoughts:   unable to assess  Judgement:  Poor  Insight:  Lacking  Psychomotor Activity:  Restlessness  Akathisia:  No  Fund of Knowledge:  Negative      Assets:  Social Support  Cognition:  Impaired,  Severe   ADL's:  Impaired  AIMS (if indicated):        Other History   These have been pulled in through the EMR, reviewed, and updated if appropriate.  Family History:  The patient's family history includes Lung cancer in his mother.  Medical History: Past Medical History:  Diagnosis Date   Allergy    Hearing deficit    Parkinson's disease [facility])     Surgical History: Past Surgical History:  Procedure Laterality Date   COCHLEAR IMPLANT Left    PARTIAL COLECTOMY       Medications:   Current Facility-Administered Medications:    acetaminophen (TYLENOL) tablet 650 mg, 650 mg, Oral, Q6H PRN, Lorretta Harp, MD, 650 mg at 07/08/23 2134   [COMPLETED] bisacodyl (DULCOLAX) EC tablet 10 mg, 10 mg, Oral, BID, 10 mg at 07/06/23 2122 **FOLLOWED BY** bisacodyl (DULCOLAX) EC tablet 10 mg, 10 mg, Oral, QHS, Gillis Santa, MD, 10 mg at 07/23/23 2241   bisacodyl (DULCOLAX) suppository 10 mg, 10 mg, Rectal, Daily PRN, Gillis Santa, MD, 10 mg at 07/13/23 0116   carbidopa-levodopa (  SINEMET IR) 25-100 MG per tablet immediate release 2 tablet, 2 tablet, Oral, TID, Phineas Semen, MD, 2 tablet at 07/24/23 1319   cyanocobalamin (VITAMIN B12) injection 1,000 mcg, 1,000 mcg, Intramuscular, Once, Djan, Scarlette Calico, MD   cyanocobalamin (VITAMIN B12) tablet 1,000 mcg, 1,000 mcg, Oral, Daily, Lorretta Harp, MD, 1,000 mcg at 07/24/23 1017   diazepam (VALIUM) tablet 5 mg, 5 mg, Oral, Q8H PRN, Sunnie Nielsen, DO, 5 mg at 07/24/23 1019   divalproex (DEPAKOTE SPRINKLE) capsule 250 mg, 250 mg, Oral, Q12H, Verner Chol, MD, 250 mg at 07/24/23 1045   doxazosin (CARDURA) tablet 1 mg, 1 mg, Oral, Daily, Gillis Santa, MD, 1 mg at 07/24/23 1018   enoxaparin (LOVENOX) injection 40 mg, 40 mg, Subcutaneous, Q24H, Lorretta Harp, MD, 40 mg at 07/23/23 2242   escitalopram (LEXAPRO) tablet 5 mg, 5 mg, Oral, Daily, Lauree Chandler, NP, 5 mg at 07/24/23 1017   feeding supplement (ENSURE ENLIVE / ENSURE PLUS) liquid 237 mL, 237 mL,  Oral, BID BM, Gillis Santa, MD, 237 mL at 07/24/23 1018   hydrALAZINE (APRESOLINE) tablet 25 mg, 25 mg, Oral, Q6H PRN, Sunnie Nielsen, DO   latanoprost (XALATAN) 0.005 % ophthalmic solution 1 drop, 1 drop, Both Eyes, QHS, Niu, Brien Few, MD, 1 drop at 07/23/23 2243   ondansetron (ZOFRAN) injection 4 mg, 4 mg, Intravenous, Q8H PRN, Lorretta Harp, MD   polyethylene glycol (MIRALAX / GLYCOLAX) packet 17 g, 17 g, Oral, BID, Gillis Santa, MD, 17 g at 07/24/23 1018   polyvinyl alcohol (LIQUIFILM TEARS) 1.4 % ophthalmic solution 2 drop, 2 drop, Both Eyes, PRN, Sreenath, Sudheer B, MD   QUEtiapine (SEROQUEL) tablet 150 mg, 150 mg, Oral, TID, Meriam Sprague, Prince T, MD, 150 mg at 07/24/23 1018   traZODone (DESYREL) tablet 150 mg, 150 mg, Oral, QHS, Sreenath, Sudheer B, MD, 150 mg at 07/23/23 2241   ziprasidone (GEODON) injection 10 mg, 10 mg, Intramuscular, Q4H PRN, Lolita Patella B, MD, 10 mg at 07/23/23 0407  Allergies: Allergies  Allergen Reactions   Opium Poppy [Papaver] Anaphylaxis and Hives   Penicillins Anaphylaxis and Rash   Sulfa Antibiotics Anaphylaxis   Penicillins    Sulfa Antibiotics     Verner Chol, MD

## 2023-07-24 NOTE — Progress Notes (Signed)
                                                     Palliative Care Progress Note   Patient Name: Justin Bird       Date: 07/24/2023 DOB: 12-23-1940  Age: 83 y.o. MRN#: [id number] Attending Physician: Sunnie Nielsen, DO Primary Care Physician: Dorothey Baseman, MD Admit Date: 06/29/2023  Extensive chart review completed including labs, vital signs, imaging, progress notes, and orders.  As per chart review, psych consult was completed today and recommend optimizing dosage of Seroquel and adding Depakote sprinkles for further mood stabilization.  Barrier to discharge remains that patient must be off of as needed psych meds for 48 hours for [facility] or other facilities to consider him for SNF placement.  No acute palliative needs at this time.  PMT remains billed to patient and family throughout his hospitalization.  Thank you for allowing the Palliative Medicine Team to assist in the care of [facility].  Samara Deist L. Bonita Quin, DNP, FNP-BC Palliative Medicine Team    No charge

## 2023-07-24 NOTE — TOC Progression Note (Signed)
Transition of Care [facility]) - Progression Note    Patient Details  Name: Justin Bird MRN: [id number] Date of Birth: [birth year]  [facility]) CM/SW Contact  Margarito Liner, LCSW Phone Number: 07/24/2023, 10:37 AM  Clinical Narrative: Left voicemail for [facility] admissions coordinator to see if they can accept patient today. Berkley Harvey is valid through tomorrow.    Expected Discharge Plan: Skilled Nursing Facility Barriers to Discharge: Barriers Resolved  Expected Discharge Plan and Services       Living arrangements for the past 2 months: Single Family Home Expected Discharge Date: 07/22/23                                     Social Determinants of Health (SDOH) Interventions SDOH Screenings   Food Insecurity: Patient Unable To Answer (07/01/2023)  Housing: Patient Unable To Answer (07/10/2023)  Transportation Needs: No Transportation Needs (07/01/2023)  Utilities: Not At Risk (07/01/2023)  Financial Resource Strain: Patient Declined (01/04/2023)   Received from [facility]  Social Connections: Patient Unable To Answer (07/01/2023)  Tobacco Use: Low Risk  (06/30/2023)    Readmission Risk Interventions     No data to display

## 2023-07-24 NOTE — Plan of Care (Signed)

## 2023-07-24 NOTE — Progress Notes (Signed)
PROGRESS NOTE    Justin Bird   ZOX:[id number] DOB: [birth year]  DOA: 06/29/2023 Date of Service: 07/24/23 which is hospital day 24  PCP: Dorothey Baseman, MD    Hospital course / significant events:   HPI: 83 y.o. male with medical history significant of hard of hearing, depression, Parkinson's disease, allergy, who presents with altered mental status. Per daughter, at baseline patient is oriented to person and place, usually is confused about the time. In the past 4 days, patient has been confused. Patient is agitated, yelling around the ED. He moves all extremities, kicking around.   01/02: admitted to hospitalist service w/ AMS, neurology consult. Of note, cannot do MRI due to presence of cochlear implant.  01/04: initial psych consult - they have been intermittently following  01/05-01/27: placement pending and approved to go 01/24 but was agitated and EMS unable to take him that evening, better this morning 01/25 and ok to discharge but now facility will not take him     Consultants:  Neurology  Psychiatry   Procedures/Surgeries: none      ASSESSMENT & PLAN:   Acute encephalopathy Metabolic/Toxic causes reasonably ruled out  Etiology is not clear. Ddx most c/w advancing dementia / vascular dementia, mild B12 deficiency but should not cause this, missed Sinemet, which may have contributed partially due to withdrawal. Metabolic and infectious workup have been negative so far Psychiatric medications:  Continue seroquel 150mg  oral 3 times daily Continue trazodone 150mg  oral daily at bedtime Continue depakote sprinkle 250mg  oral every 12 hours Continue lexapro 5mg  oral daily Continue geodon 10mg  IM every 4 hours PRN agitation. Of note, has not needed PRN geodon since 07/14/23. not a candidate for inpatient psych admission according to psych team Patient remains on Sinemet for his Parkinson's. 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   Continue outpatient ENT follow-up   Acute urinary retention A Foley catheter is noted.  Placed on 1/8 for urinary retention.  No known history of BPH.    Voiding trial can be attempted when patient is more ambulatory. Flomax was considered however patient has severe allergic reaction to sulfa medications including anaphylaxis. Continue to monitor urine output closely      overweigh based on BMI: Body mass index is 26.3 kg/m.  Underweight - under 18  overweight - 25 to 29 obese - 30 or more Class 1 obesity: BMI of 30.0 to 34 Class 2 obesity: BMI of 35.0 to 39 Class 3 obesity: BMI of 40.0 to 49 Super Morbid Obesity: BMI 50-59 Super-super Morbid Obesity: BMI 60+ Significantly low or high BMI is associated with higher medical risk.  Weight management advised as adjunct to other disease management and risk reduction treatments    DVT prophylaxis: lovenox IV fluids: no continuous IV fluids  Nutrition: regular diet  Central lines / invasive devices: none  Code Status: FULL CODE ACP documentation reviewed:  none on file in [facility]  TOC needs: placement  Barriers to dispo / significant pending items: placement              Subjective / Brief ROS:  Patient reports no concerns He is pleasantly confused  Denies CP/SOB.    Family Communication:  none at this time     Objective Findings:  Vitals:   07/23/23 2145 07/24/23 0335 07/24/23 0614 07/24/23 0732  BP: (!) 163/87 (!) 154/84 (!) 149/94 (!) 147/70  Pulse: (!) 103 83 88 99  Resp: 18  16 18   Temp: 97.9 F (36.6 C)  98.1 F (36.7 C) 97.8 F (36.6 C)  TempSrc: Oral  Oral   SpO2: 98% 95% 100% 92%  Weight:       Height:       No intake or output data in the 24 hours ending 07/24/23 1618  Filed Weights   07/12/23 0857  Weight: 71.7 kg    Examination:  Physical Exam Constitutional:      General: He is not in acute distress.    Appearance: He is not ill-appearing.  Cardiovascular:     Rate and Rhythm: Normal rate and regular rhythm.  Pulmonary:     Effort: Pulmonary effort is normal.     Breath sounds: Normal breath sounds.  Musculoskeletal:     Right lower leg: No edema.     Left lower leg: No edema.  Skin:    General: Skin is warm and dry.  Neurological:     Mental Status: He is alert. Mental status is at baseline.  Psychiatric:        Behavior: Behavior normal.          Scheduled Medications:   bisacodyl  10 mg Oral QHS   carbidopa-levodopa  2 tablet Oral TID   cyanocobalamin  1,000 mcg Intramuscular Once   cyanocobalamin  1,000 mcg Oral Daily   divalproex  250 mg Oral Q12H   doxazosin  1 mg Oral Daily   enoxaparin (LOVENOX) injection  40 mg Subcutaneous Q24H   escitalopram  5 mg Oral Daily   feeding supplement  237 mL Oral BID BM   latanoprost  1 drop Both Eyes QHS   polyethylene glycol  17 g Oral BID   QUEtiapine  150 mg Oral TID   traZODone  150 mg Oral QHS    Continuous Infusions:   PRN Medications:  acetaminophen, bisacodyl, diazepam, hydrALAZINE, ondansetron (ZOFRAN) IV, polyvinyl alcohol, ziprasidone  Antimicrobials from admission:  Anti-infectives (From admission, onward)    None           Data Reviewed:  I have personally reviewed the following...  CBC: Recent Labs  Lab 07/18/23 0500 07/19/23 0520  WBC 8.5 8.8  NEUTROABS 6.2 6.7  HGB 16.5 16.7  HCT 51.2 50.4  MCV 91.8 89.5  PLT 324 318   Basic Metabolic Panel: Recent Labs  Lab 07/18/23 0500 07/19/23 0520  NA 141 141  K 3.6 3.7  CL 104 102  CO2 26 27  GLUCOSE 75 102*  BUN 19 16  CREATININE 0.64 0.68  CALCIUM 8.7* 9.0   GFR: Estimated Creatinine Clearance: 63 mL/min  (by C-G formula based on SCr of 0.68 mg/dL). Liver Function Tests: No results for input(s): "AST", "ALT", "ALKPHOS", "BILITOT", "PROT", "ALBUMIN" in the last 168 hours. No results for input(s): "LIPASE", "AMYLASE" in the last 168 hours. No results for input(s): "AMMONIA" in the last 168 hours. Coagulation Profile: No results for input(s): "INR", "PROTIME" in the last 168 hours. Cardiac Enzymes: No results for input(s): "CKTOTAL", "CKMB", "CKMBINDEX", "TROPONINI" in the last 168 hours. BNP (last 3 results) No results for input(s): "PROBNP" in the last 8760 hours. HbA1C: No results for input(s): "HGBA1C" in the last 72 hours. CBG: No results for input(s): "GLUCAP" in  the last 168 hours. Lipid Profile: No results for input(s): "CHOL", "HDL", "LDLCALC", "TRIG", "CHOLHDL", "LDLDIRECT" in the last 72 hours. Thyroid Function Tests: No results for input(s): "TSH", "T4TOTAL", "FREET4", "T3FREE", "THYROIDAB" in the last 72 hours. Anemia Panel: No results for input(s): "VITAMINB12", "FOLATE", "FERRITIN", "TIBC", "IRON", "RETICCTPCT" in the last 72 hours. Most Recent Urinalysis On File:     Component Value Date/Time   COLORURINE YELLOW (A) 06/29/2023 1902   APPEARANCEUR CLEAR (A) 06/29/2023 1902   LABSPEC 1.019 06/29/2023 1902   PHURINE 6.0 06/29/2023 1902   GLUCOSEU NEGATIVE 06/29/2023 1902   HGBUR NEGATIVE 06/29/2023 1902   BILIRUBINUR NEGATIVE 06/29/2023 1902   KETONESUR 20 (A) 06/29/2023 1902   PROTEINUR NEGATIVE 06/29/2023 1902   NITRITE NEGATIVE 06/29/2023 1902   LEUKOCYTESUR NEGATIVE 06/29/2023 1902   Sepsis Labs: @LABRCNTIP (procalcitonin:4,lacticidven:4) Microbiology: No results found for this or any previous visit (from the past 240 hours).    Radiology Studies last 3 days: No results found.   Sunnie Nielsen, DO [facility] 07/24/2023, 4:18 PM    Dictation software may have been used to generate the above note. Typos may occur and escape review in  typed/dictated notes. Please contact Dr Lyn Hollingshead directly for clarity if needed.  Staff may message me via secure chat in Epic  but this may not receive an immediate response,  please page me for urgent matters!  If 7PM-7AM, please contact night coverage [email or web address]

## 2023-07-25 DIAGNOSIS — R4182 Altered mental status, unspecified: Secondary | ICD-10-CM | POA: Diagnosis not present

## 2023-07-25 DIAGNOSIS — G20A1 Parkinson's disease without dyskinesia, without mention of fluctuations: Secondary | ICD-10-CM | POA: Diagnosis not present

## 2023-07-25 DIAGNOSIS — G9341 Metabolic encephalopathy: Secondary | ICD-10-CM | POA: Diagnosis not present

## 2023-07-25 DIAGNOSIS — Z515 Encounter for palliative care: Secondary | ICD-10-CM | POA: Diagnosis not present

## 2023-07-25 MED ORDER — LORAZEPAM 1 MG PO TABS
1.0000 mg | ORAL_TABLET | Freq: Two times a day (BID) | ORAL | Status: DC
Start: 2023-07-25 — End: 2023-07-27
  Administered 2023-07-25 – 2023-07-26 (×3): 1 mg via ORAL
  Filled 2023-07-25 (×3): qty 1

## 2023-07-25 NOTE — Progress Notes (Signed)
Palliative Care Progress Note, Assessment & Plan   Patient Name: Justin Bird       Date: 07/25/2023 DOB: 1941-03-27  Age: 83 y.o. MRN#: [id number] Attending Physician: Sunnie Nielsen, DO Primary Care Physician: Dorothey Baseman, MD Admit Date: 06/29/2023  Subjective: Patient is lying in bed and leaning to his right side.  He is awake and alert.  He acknowledges my presence is makes no vocalizations or engages in conversation.  He appears to be talking to someone who is not in the room.  Signs of distress or pain not noted.  No family or friends present during my visit.  HPI: 83 y.o. male  with past medical history of depression, Parkinson's, and HOH admitted on 06/29/2023 with AMS.   Over course of now 25-day hospitalization, patient has been treated for acute metabolic encephalopathy (CT of head negative for acute intracranial abnormalities), vitamin B12 deficiency, Parkinson's, and acute urinary retention. Placement was pending and approved for him to transfer to [facility] on 1/24.  However, patient became agitated and EMS was unable to take him that evening.  TOC following closely for discharge planning.   PMT was consulted to discuss boundaries and goals of care.  Summary of counseling/coordination of care: Extensive chart review completed prior to meeting patient including labs, vital signs, imaging, progress notes, orders, and available advanced directive documents from current and previous encounters.   After reviewing the patient's chart and assessing the patient at bedside, I spoke with patient in regards to symptom management and goals of care.  He is unable to participate in goals of care or medical decision making at this time.  He denies pain, discomfort, headache, chest pain, or  other acute issues at this time.  No adjustment to [facility] needed.  After meeting with patient, I spoke with his daughter-in-law [name] over the phone.  She shares her husband [name] is at the country [place] work[place] and that she is calling on his behalf for updates.  Brief medical update given.  Therapeutic silence and active listening provided for [name] to share her shot thoughts and emotions regarding patient's current health status.  [name] states family has not received updates from medical team or callback from medical team in regards to receiving updates.  She does not have a clear understanding as to why patient cannot return to [facility].  Message sent to [facility] Darrian to help clarify.  Asked that TOC speak directly with [name] to answer her questions.  I also secure chatted with attending Dr. Lyn Hollingshead who plans to speak with family as well.  I attempted to elicit values and goals important to patient and [name].  She shares that family wants patient to go to [facility] as this is where his wife is.  She understands that he can become agitated but believes that this is more of a progression of his dementia than a psych/safety issue.  She inquires about whether or not the patient could transfer to memory care unit at [facility].  I shared that [facility] will follow closely and discussed with her.  Shared I have reached out to them and asked them to contact her directly to discuss discharge planning.  From my  discussion with [name] and my previous discussion with her husband/patient's son/POA [name] last week, family wishes for patient to transfer back to [facility]. TOC following closely for discharge planning.   As per my previous discussions with [name], family has a healthcare power of attorney on file.  I requested this information be emailed for medical team to review.  [name] has shared in the past that he believes his father's wishes would be for a DNR with limited  interventions, no feeding tube, and no acceptance of artificial means to sustain his life.  Awaiting review of healthcare power of attorney before making additional changes to CODE STATUS/plan of care.  Physical Exam Vitals reviewed.  Constitutional:      General: He is not in acute distress.    Appearance: He is normal weight.  HENT:     Head: Normocephalic.     Mouth/Throat:     Mouth: Mucous membranes are moist.  Eyes:     Pupils: Pupils are equal, round, and reactive to light.  Pulmonary:     Effort: Pulmonary effort is normal.  Abdominal:     Palpations: Abdomen is soft.  Skin:    General: Skin is warm and dry.  Neurological:     Mental Status: He is alert.     Comments: Oriented to self  Psychiatric:        Mood and Affect: Mood normal.        Judgment: Judgment normal.             Total Time 50 minutes   Time spent includes: Detailed review of medical records (labs, imaging, vital signs), medically appropriate exam (mental status, respiratory, cardiac, skin), discussed with treatment team, counseling and educating patient, family and staff, documenting clinical information, medication management and coordination of care.  Samara Deist L. Bonita Quin, DNP, FNP-BC Palliative Medicine Team

## 2023-07-25 NOTE — TOC Progression Note (Addendum)
Transition of Care [facility]) - Progression Note    Patient Details  Name: Justin Bird MRN: [id number] Date of Birth: 1940/12/26  Transition of [facility]) CM/SW Contact  Allena Katz, LCSW Phone Number: 07/25/2023, 8:56 AM  Clinical Narrative:    Lewayne Bunting states that pt has to be off any PRN psych meds for 48 hours for them to be able to take him.   CSW spoke with patients daughter to provide a update.    Expected Discharge Plan: Skilled Nursing Facility Barriers to Discharge: Barriers Resolved  Expected Discharge Plan and Services       Living arrangements for the past 2 months: Single Family Home Expected Discharge Date: 07/22/23                                     Social Determinants of Health (SDOH) Interventions SDOH Screenings   Food Insecurity: Patient Unable To Answer (07/01/2023)  Housing: Patient Unable To Answer (07/10/2023)  Transportation Needs: No Transportation Needs (07/01/2023)  Utilities: Not At Risk (07/01/2023)  Financial Resource Strain: Patient Declined (01/04/2023)   Received from [facility]  Social Connections: Patient Unable To Answer (07/01/2023)  Tobacco Use: Low Risk  (06/30/2023)    Readmission Risk Interventions     No data to display

## 2023-07-25 NOTE — Plan of Care (Signed)
  Problem: Clinical Measurements: Goal: Will remain free from infection Outcome: Progressing   Problem: Nutrition: Goal: Adequate nutrition will be maintained Outcome: Progressing   Problem: Elimination: Goal: Will not experience complications related to bowel motility Outcome: Progressing   Problem: Pain Managment: Goal: General experience of comfort will improve and/or be controlled Outcome: Progressing   Problem: Safety: Goal: Ability to remain free from injury will improve Outcome: Progressing   Problem: Skin Integrity: Goal: Risk for impaired skin integrity will decrease Outcome: Progressing

## 2023-07-25 NOTE — Progress Notes (Signed)
PROGRESS NOTE    Justin Bird   UVO:[id number] DOB: 15-Jun-1941  DOA: 06/29/2023 Date of Service: 07/25/23 which is hospital day 25  PCP: Dorothey Baseman, MD    Hospital course / significant events:   HPI: 83 y.o. male with medical history significant of hard of hearing, depression, Parkinson's disease, allergy, who presents with altered mental status. Per daughter, at baseline patient is oriented to person and place, usually is confused about the time. In the past 4 days, patient has been confused. Patient is agitated, yelling around the ED. He moves all extremities, kicking around.   01/02: admitted to hospitalist service w/ AMS, neurology consult. Of note, cannot do MRI due to presence of cochlear implant.  01/04: initial psych consult - they have been intermittently following  01/05-01/27: placement pending and approved to go 01/24 but was agitated and EMS unable to take him that evening.  He was here through the weekend, SNF was unable to take him 01/27, looking into other options. 01/28: Patient has been calm past several days, cooperative.  Significant hard of hearing, have not had his cochlear implant in, staff instructed to use this when working with him. Working on placement with TOC.  Spoke to daughter-in-law, goal is still to get him to [facility] if possible because that is where his wife is, that is where they are wanting to eventually set him up with LTC     Consultants:  Neurology  Psychiatry   Procedures/Surgeries: none      ASSESSMENT & PLAN:   Acute encephalopathy Metabolic/Toxic causes reasonably ruled out  Etiology is not clear. Ddx most c/w advancing dementia / vascular dementia, mild B12 deficiency but should not cause this, missed Sinemet, which may have contributed partially due to withdrawal. Metabolic and infectious workup have been negative  Psychiatric medications:  Continue seroquel 150mg  oral 3 times daily Continue trazodone 150mg  oral  daily at bedtime Continue depakote sprinkle 250mg  oral every 12 hours Continue lexapro 5mg  oral daily Continue ativan 1 mg bid  not a candidate for inpatient psych admission according to psych team Patient remains on Sinemet for his Parkinson's. 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  Placed on 1/8 for urinary retention.  No known history of BPH.    Voiding trial can be attempted when patient is more ambulatory. Flomax was considered however patient has severe allergic reaction to sulfa medications including anaphylaxis. Continue to monitor urine output closely      overweigh based on BMI: Body mass index is 26.3 kg/m.  Underweight - under 18  overweight - 25 to 29 obese - 30 or more Class 1 obesity: BMI of 30.0 to 34 Class 2 obesity: BMI of 35.0 to 39 Class 3 obesity: BMI of 40.0 to 49 Super Morbid Obesity: BMI 50-59 Super-super Morbid Obesity: BMI 60+ Significantly low or high BMI is associated with higher medical risk.  Weight management advised as adjunct to other disease management and risk reduction treatments    DVT prophylaxis: lovenox IV fluids: no continuous IV fluids  Nutrition: regular diet  Central lines / invasive devices: none  Code  Status: FULL CODE ACP documentation reviewed:  none on file in [facility]  TOC needs: placement  Barriers to dispo / significant pending items: placement              Subjective / Brief ROS:  Patient reports no concerns He is pleasantly confused and VERY hard of heargin  Denies CP/SOB.    Family  Communication: 07/25/23 3:56 PM spoke on phone with daughter-in-law, [name], at length.    Objective Findings:  Vitals:   07/24/23 1656 07/24/23 2009 07/25/23 0453 07/25/23 0811  BP: 126/73 (!) 172/88 (!) 168/97 (!) 145/82  Pulse: 94 (!) 103 91 90  Resp: 18 18  18   Temp: 98.4 F (36.9 C)   98 F (36.7 C)  TempSrc: Oral     SpO2: 94% 98% 94% 98%  Weight:      Height:        Intake/Output Summary (Last 24 hours) at 07/25/2023 1556 Last data filed at 07/25/2023 1548 Gross per 24 hour  Intake 120 ml  Output --  Net 120 ml    Filed Weights   07/12/23 0857  Weight: 71.7 kg    Examination:  Physical Exam Constitutional:      General: He is not in acute distress.    Appearance: He is not ill-appearing.  Cardiovascular:     Rate and Rhythm: Normal rate and regular rhythm.  Pulmonary:     Effort: Pulmonary effort is normal.     Breath sounds: Normal breath sounds.  Musculoskeletal:     Right lower leg: No edema.     Left lower leg: No edema.  Skin:    General: Skin is warm and dry.  Neurological:     Mental Status: He is alert. Mental status is at baseline.  Psychiatric:        Behavior: Behavior normal.          Scheduled Medications:   bisacodyl  10 mg Oral QHS   carbidopa-levodopa  2 tablet Oral TID   cyanocobalamin  1,000 mcg Intramuscular Once   cyanocobalamin  1,000 mcg Oral Daily   divalproex  250 mg Oral Q12H   doxazosin  1 mg Oral Daily   enoxaparin (LOVENOX) injection  40 mg Subcutaneous Q24H   escitalopram  5 mg Oral Daily   feeding supplement  237 mL Oral BID BM   latanoprost  1 drop Both Eyes QHS   LORazepam  1 mg Oral BID   polyethylene glycol  17 g Oral BID   QUEtiapine  150 mg Oral TID   traZODone  150 mg Oral QHS    Continuous Infusions:   PRN Medications:  acetaminophen, bisacodyl, hydrALAZINE, ondansetron (ZOFRAN) IV, polyvinyl alcohol  Antimicrobials from admission:  Anti-infectives (From admission, onward)    None            Data Reviewed:  I have personally reviewed the following...  CBC: Recent Labs  Lab 07/19/23 0520  WBC 8.8  NEUTROABS 6.7  HGB 16.7  HCT 50.4  MCV 89.5  PLT 318   Basic Metabolic Panel: Recent Labs  Lab 07/19/23 0520  NA 141  K 3.7  CL 102  CO2 27  GLUCOSE 102*  BUN 16  CREATININE 0.68  CALCIUM 9.0   GFR: Estimated Creatinine Clearance: 63 mL/min (by C-G formula based on SCr of 0.68 mg/dL). Liver Function Tests: No results for input(s): "AST", "ALT", "ALKPHOS", "BILITOT", "PROT", "ALBUMIN" in the last 168 hours. No results for input(s): "LIPASE", "AMYLASE" in  the last 168 hours. No results for input(s): "AMMONIA" in the last 168 hours. Coagulation Profile: No results for input(s): "INR", "PROTIME" in the last 168 hours. Cardiac Enzymes: No results for input(s): "CKTOTAL", "CKMB", "CKMBINDEX", "TROPONINI" in the last 168 hours. BNP (last 3 results) No results for input(s): "PROBNP" in the last 8760 hours. HbA1C: No results for input(s): "HGBA1C" in the last 72 hours. CBG: No results for input(s): "GLUCAP" in the last 168 hours. Lipid Profile: No results for input(s): "CHOL", "HDL", "LDLCALC", "TRIG", "CHOLHDL", "LDLDIRECT" in the last 72 hours. Thyroid Function Tests: No results for input(s): "TSH", "T4TOTAL", "FREET4", "T3FREE", "THYROIDAB" in the last 72 hours. Anemia Panel: No results for input(s): "VITAMINB12", "FOLATE", "FERRITIN", "TIBC", "IRON", "RETICCTPCT" in the last 72 hours. Most Recent Urinalysis On File:     Component Value Date/Time   COLORURINE YELLOW (A) 06/29/2023 1902   APPEARANCEUR CLEAR (A) 06/29/2023 1902   LABSPEC 1.019 06/29/2023 1902   PHURINE 6.0 06/29/2023 1902   GLUCOSEU NEGATIVE 06/29/2023 1902   HGBUR NEGATIVE 06/29/2023 1902   BILIRUBINUR NEGATIVE 06/29/2023 1902   KETONESUR 20 (A) 06/29/2023 1902   PROTEINUR NEGATIVE 06/29/2023 1902   NITRITE NEGATIVE 06/29/2023 1902   LEUKOCYTESUR NEGATIVE 06/29/2023 1902    Sepsis Labs: @LABRCNTIP (procalcitonin:4,lacticidven:4) Microbiology: No results found for this or any previous visit (from the past 240 hours).    Radiology Studies last 3 days: No results found.   Sunnie Nielsen, DO [facility] 07/25/2023, 3:56 PM    Dictation software may have been used to generate the above note. Typos may occur and escape review in typed/dictated notes. Please contact Dr Lyn Hollingshead directly for clarity if needed.  Staff may message me via secure chat in Epic  but this may not receive an immediate response,  please page me for urgent matters!  If 7PM-7AM, please contact night coverage [email or web address]

## 2023-07-26 DIAGNOSIS — G9341 Metabolic encephalopathy: Secondary | ICD-10-CM | POA: Diagnosis not present

## 2023-07-26 DIAGNOSIS — R4182 Altered mental status, unspecified: Secondary | ICD-10-CM | POA: Diagnosis not present

## 2023-07-26 DIAGNOSIS — Z515 Encounter for palliative care: Secondary | ICD-10-CM | POA: Diagnosis not present

## 2023-07-26 NOTE — Progress Notes (Signed)
                                                     Palliative Care Progress Note, Assessment & Plan   Patient Name: Justin Bird       Date: 07/26/2023 DOB: 1940/07/12  Age: 83 y.o. MRN#: [id number] Attending Physician: Leeroy Bock, MD Primary Care Physician: Dorothey Baseman, MD Admit Date: 06/29/2023  Subjective: Patient is lying in bed, sleeping.  No distress noted.  Respirations are even and unlabored.  He awakens to my voice but does not make eye contact or engage in discussions.  He returns to sleep quickly.  No family or friends present during my visit.  HPI: 83 y.o. male  with past medical history of depression, Parkinson's, and HOH admitted on 06/29/2023 with AMS.   Over course of now 25-day hospitalization, patient has been treated for acute metabolic encephalopathy (CT of head negative for acute intracranial abnormalities), vitamin B12 deficiency, Parkinson's, and acute urinary retention. Placement was pending and approved for him to transfer to [facility] on 1/24.  However, patient became agitated and EMS was unable to take him that evening.  TOC following closely for discharge planning.   PMT was consulted to discuss boundaries and goals of care.  Summary of counseling/coordination of care: Extensive chart review completed prior to meeting patient including labs, vital signs, imaging, progress notes, orders, and available advanced directive documents from current and previous encounters.   After reviewing the patient's chart and assessing the patient at bedside, I do not recommend any changes to patient's plan of care at this time.  He is resting comfortably with no signs of agitation.  No adjustment to [facility] needed.  After visiting with patient, I counseled with TOC Darrian via secure chat in regards to plan of care. Family  wishes for patient to return to [facility]. TOC following closely for discharge planning.  PMT will continue to follow and support when appropriate.    Physical Exam Vitals reviewed.  Constitutional:      General: He is not in acute distress.    Appearance: He is normal weight.  HENT:     Head: Normocephalic.     Mouth/Throat:     Mouth: Mucous membranes are moist.  Pulmonary:     Effort: Pulmonary effort is normal.  Abdominal:     Palpations: Abdomen is soft.  Skin:    General: Skin is warm and dry.  Psychiatric:        Behavior: Behavior normal.        Judgment: Judgment normal.             Total Time 25 minutes   Time spent includes: Detailed review of medical records (labs, imaging, vital signs), medically appropriate exam (mental status, respiratory, cardiac, skin), discussed with treatment team, counseling and educating patient, family and staff, documenting clinical information, medication management and coordination of care.  Samara Deist L. Bonita Quin, DNP, FNP-BC Palliative Medicine Team

## 2023-07-26 NOTE — Plan of Care (Signed)
  Problem: Clinical Measurements: Goal: Ability to maintain clinical measurements within normal limits will improve Outcome: Not Applicable Goal: Will remain free from infection Outcome: Not Applicable Goal: Diagnostic test results will improve Outcome: Not Applicable Goal: Respiratory complications will improve Outcome: Not Applicable Goal: Cardiovascular complication will be avoided Outcome: Not Applicable   Problem: Education: Goal: Knowledge of General Education information will improve Description: Including pain rating scale, medication(s)/side effects and non-pharmacologic comfort measures Outcome: Not Applicable   Problem: Health Behavior/Discharge Planning: Goal: Ability to manage health-related needs will improve Outcome: Not Applicable   Problem: Clinical Measurements: Goal: Ability to maintain clinical measurements within normal limits will improve Outcome: Not Applicable Goal: Will remain free from infection Outcome: Not Applicable Goal: Diagnostic test results will improve Outcome: Not Applicable Goal: Respiratory complications will improve Outcome: Not Applicable Goal: Cardiovascular complication will be avoided Outcome: Not Applicable   Problem: Activity: Goal: Risk for activity intolerance will decrease Outcome: Not Applicable   Problem: Nutrition: Goal: Adequate nutrition will be maintained Outcome: Not Applicable   Problem: Coping: Goal: Level of anxiety will decrease Outcome: Not Applicable   Problem: Elimination: Goal: Will not experience complications related to bowel motility Outcome: Not Applicable Goal: Will not experience complications related to urinary retention Outcome: Not Applicable   Problem: Pain Managment: Goal: General experience of comfort will improve and/or be controlled Outcome: Not Applicable   Problem: Safety: Goal: Ability to remain free from injury will improve Outcome: Not Applicable   Problem: Skin  Integrity: Goal: Risk for impaired skin integrity will decrease Outcome: Not Applicable

## 2023-07-26 NOTE — Plan of Care (Signed)
  Problem: Clinical Measurements: Goal: Ability to maintain clinical measurements within normal limits will improve Outcome: Progressing Goal: Will remain free from infection Outcome: Progressing Goal: Diagnostic test results will improve Outcome: Progressing Goal: Respiratory complications will improve Outcome: Progressing Goal: Cardiovascular complication will be avoided Outcome: Progressing   Problem: Education: Goal: Knowledge of General Education information will improve Description: Including pain rating scale, medication(s)/side effects and non-pharmacologic comfort measures Outcome: Progressing   Problem: Health Behavior/Discharge Planning: Goal: Ability to manage health-related needs will improve Outcome: Progressing   Problem: Clinical Measurements: Goal: Ability to maintain clinical measurements within normal limits will improve Outcome: Progressing Goal: Will remain free from infection Outcome: Progressing Goal: Diagnostic test results will improve Outcome: Progressing Goal: Respiratory complications will improve Outcome: Progressing Goal: Cardiovascular complication will be avoided Outcome: Progressing   Problem: Activity: Goal: Risk for activity intolerance will decrease Outcome: Progressing   Problem: Nutrition: Goal: Adequate nutrition will be maintained Outcome: Progressing   Problem: Coping: Goal: Level of anxiety will decrease Outcome: Progressing   Problem: Elimination: Goal: Will not experience complications related to bowel motility Outcome: Progressing Goal: Will not experience complications related to urinary retention Outcome: Progressing   Problem: Pain Managment: Goal: General experience of comfort will improve and/or be controlled Outcome: Progressing   Problem: Safety: Goal: Ability to remain free from injury will improve Outcome: Progressing   Problem: Skin Integrity: Goal: Risk for impaired skin integrity will  decrease Outcome: Progressing

## 2023-07-26 NOTE — Progress Notes (Signed)
PT Cancellation Note  Patient Details Name: Justin Bird MRN: [id number] DOB: 1940/12/21   Cancelled Treatment:    Reason Eval/Treat Not Completed: Fatigue/lethargy limiting ability to participate (Consult received and chart reviewed.  Patient sleeping soundly; unable to arouse for adequate participation with session.  Will continue efforts at later time/date as appropriate.)   Nya Monds H. Manson Passey, PT, DPT, NCS 07/26/23, 1:18 PM [phone]

## 2023-07-26 NOTE — Progress Notes (Signed)
PROGRESS NOTE  Justin Bird    DOB: 12/26/1940, 83 y.o.  UJW:[id number]    Code Status: Full Code   DOA: 06/29/2023   LOS: 26   Brief hospital course  Justin Bird is a 83 y.o. male with medical history significant of hard of hearing, depression, Parkinson's disease, who presents with altered mental status. Per daughter, at baseline patient is oriented to person and place, usually is confused about the time. In the past 4 days, patient has been confused so brought to ED.   01/02: admitted to hospitalist service w/ AMS, neurology consult. Of note, cannot do MRI due to presence of cochlear implant.  01/04: initial psych consult - they have been intermittently following  01/05-01/27: placement pending and approved to go 01/24 but was agitated and EMS unable to take him that evening.  He was here through the weekend, SNF was unable to take him 01/27, looking into other options. 01/28-1/29: Patient has been calm past several days, cooperative.  Significant hard of hearing, have not had his cochlear implant in, staff instructed to use this when working with him. Working on placement with TOC.  Spoke to daughter-in-law, goal is still to get him to [facility] if possible because that is where his wife is, that is where they are wanting to eventually set him up with LTC  Assessment & Plan  Principal Problem:   Acute metabolic encephalopathy Active Problems:   Parkinson's disease (HCC)  Acute encephalopathy Metabolic/Toxic causes reasonably ruled out  Etiology is not clear. Ddx most c/w advancing dementia / vascular dementia, mild B12 deficiency but should not cause this, missed Sinemet, which may have contributed partially due to withdrawal. Metabolic and infectious workup have been negative  Psychiatric medications:  Continue seroquel 150mg  oral 3 times daily Continue trazodone 150mg  oral daily at bedtime Continue depakote sprinkle 250mg  oral every 12 hours Continue lexapro 5mg  oral  daily Continue ativan 1 mg bid  not a candidate for inpatient psych admission according to psych team Patient remains on Sinemet for his Parkinson's. 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  No known history of BPH.  Flomax was considered however patient has severe allergic reaction to sulfa medications including anaphylaxis. Foley removed?  Continue to monitor urine output closely  Body mass index is 26.3 kg/m.  VTE ppx: enoxaparin (LOVENOX) injection 40 mg Start: 06/29/23 2200  Diet:     Diet   Diet regular Room service appropriate? No; Fluid consistency: Thin   Consultants: Psych Palliative   Subjective 07/26/23    Pt reports nothing distinguishable. He speaks of people and events that are not accurate and doesn't accurately respond to questions. Able to follow simple commands. Does not appear to be acutely distressed but occasionally looks like he will cry.   Objective   Vitals:   07/25/23 0453 07/25/23 0811 07/25/23 2008 07/26/23 0618  BP: (!) 168/97 (!) 145/82 (!) 153/83 (!) 140/80  Pulse: 91 90 (!) 102 91  Resp:  18 19 18   Temp:  98 F (36.7 C) 97.7 F (36.5 C) 98.1 F (36.7 C)  TempSrc:   Oral   SpO2: 94% 98% 96% 98%  Weight:      Height:        Intake/Output Summary (Last 24 hours) at 07/26/2023 0741 Last data filed at 07/25/2023 1548 Gross per 24 hour  Intake 120 ml  Output --  Net 120 ml   Filed Weights   07/12/23 0857  Weight: 71.7 kg    Physical Exam:  General: awake, alert, NAD HEENT: atraumatic, clear  conjunctiva, anicteric sclera, MMM, hard of hearing Respiratory: normal respiratory effort. Cardiovascular: quick capillary refill, normal S1/S2, RRR, no JVD, murmurs Gastrointestinal: soft, NT, ND Nervous: unable to answer orientation questions. Speaks about people and events not currently there or appropriate to answer of questions. Can follow simple commands.  Extremities: moves all equally, no edema, normal tone Skin: dry, intact, normal temperature, normal color. No rashes, lesions or ulcers on exposed skin Psychiatry: flat. Occasional appears to be about to cry  Labs   I have personally reviewed the following labs and imaging studies CBC    Component Value Date/Time   WBC 8.8 07/19/2023 0520   RBC 5.63 07/19/2023 0520   HGB 16.7 07/19/2023 0520   HCT 50.4 07/19/2023 0520   PLT 318 07/19/2023 0520   MCV 89.5 07/19/2023 0520   MCH 29.7 07/19/2023 0520   MCHC 33.1 07/19/2023 0520   RDW 12.7 07/19/2023 0520   LYMPHSABS 1.1 07/19/2023 0520   MONOABS 0.8 07/19/2023 0520   EOSABS 0.2 07/19/2023 0520   BASOSABS 0.0 07/19/2023 0520      Latest Ref Rng & Units 07/19/2023    5:20 AM 07/18/2023    5:00 AM 07/17/2023    4:52 AM  BMP  Glucose 70 - 99 mg/dL 528  75  413   BUN 8 - 23 mg/dL 16  19  18    Creatinine 0.61 - 1.24 mg/dL 2.44  0.10  2.72   Sodium 135 - 145 mmol/L 141  141  141   Potassium 3.5 - 5.1 mmol/L 3.7  3.6  3.6   Chloride 98 - 111 mmol/L 102  104  105   CO2 22 - 32 mmol/L 27  26  27    Calcium 8.9 - 10.3 mg/dL 9.0  8.7  9.0     No results found.  Disposition Plan & Communication  Patient status: Inpatient  Admitted From: Home Planned disposition location: Skilled nursing facility Anticipated discharge date: 1/31 pending dispo  Family Communication: none at bedside    Author: Leeroy Bock, DO [facility] 07/26/2023, 7:41 AM   Available by Epic secure chat 7AM-7PM. If 7PM-7AM, please contact night-coverage.  [facility] contact information found on  [email or web address].

## 2023-07-27 DIAGNOSIS — G9341 Metabolic encephalopathy: Secondary | ICD-10-CM | POA: Diagnosis not present

## 2023-07-27 DIAGNOSIS — Z515 Encounter for palliative care: Secondary | ICD-10-CM | POA: Diagnosis not present

## 2023-07-27 DIAGNOSIS — R4182 Altered mental status, unspecified: Secondary | ICD-10-CM | POA: Diagnosis not present

## 2023-07-27 DIAGNOSIS — G20A1 Parkinson's disease without dyskinesia, without mention of fluctuations: Secondary | ICD-10-CM | POA: Diagnosis not present

## 2023-07-27 MED ORDER — LORAZEPAM 1 MG PO TABS
1.0000 mg | ORAL_TABLET | Freq: Three times a day (TID) | ORAL | Status: DC
  Administered 2023-07-27 – 2023-08-03 (×19): 1 mg via ORAL
  Filled 2023-07-27 (×19): qty 1

## 2023-07-27 NOTE — Progress Notes (Signed)
PPhysical Therapy Treatment Patient Details Name: Justin Bird MRN: [id number] DOB: [birth year] Today's Date: 07/27/2023   History of Present Illness Pt is an 83 y/o M admitted on 06/29/23 after presenting with AMS; admitted for management of acute encephalopathy, unknown etiology. Head CT negative for acute issues (unable to obtain MRI 2/2 cochlear implant). PMH: HOH with cochlear implant, depression, Parkinson's disease    PT Comments  Second session with patient this date, as caregiver arrived earlier in afternoon to educate staff on management/charging of cochlear implant.   R hearing aide and L cochlear implant successfully applied for duration of PM session. Patient more alert in PM and with devices in place, but remains globally confused (oriented to self only) with persistent difficulty following one-step commands.  Was somewhat less resistant to facilitated movement, but still requiring total assist +2 for bed mobility; mod/max assist for unsupported sitting balance; total assist +2 for partial sit/stand with bilat HHA. Do note continued R lateral lean in supine and sitting, questionable inattention to R side (does respond to deep pressure/pain to R UE) with poor coordination and poor ability to functionally utilize R UE (mild ataxia?).  Speech remains garbled and unintelligible; marked oral apraxia noted; unable to effectively communicate simple wants/needs.  MD informed/aware of current status and noted deficits Nursing at bedside end of session to finish assisting patient with meal tray (dep for feeding)   If plan is discharge home, recommend the following: Two people to help with walking and/or transfers;Two people to help with bathing/dressing/bathroom;Assistance with cooking/housework;Assistance with feeding;Direct supervision/assist for medications management;Direct supervision/assist for financial management;Assist for transportation;Help with stairs or ramp for entrance;Supervision  due to cognitive status   Can travel by private vehicle     No  Equipment Recommendations       Recommendations for Other Services       Precautions / Restrictions Precautions Precautions: Fall Precaution Comments: L cochlear implant, R hearing aide, decreased vision Restrictions Weight Bearing Restrictions Per Provider Order: No     Mobility  Bed Mobility Overal bed mobility: Needs Assistance Bed Mobility: Supine to Sit, Sit to Supine Rolling: Mod assist, +2 for physical assistance   Supine to sit: Total assist, +2 for physical assistance Sit to supine: Total assist, +2 for physical assistance   General bed mobility comments: continues to require hand-over-hand, less resistant to movement attempts in PM    Transfers Overall transfer level: Needs assistance Equipment used: 2 person hand held assist Transfers: Sit to/from Stand Sit to Stand: Total assist, +2 physical assistance           General transfer comment: did clear buttocks this date, but unable to maintain >2-3 seconds, unable to incorporate any funcitonal activity or further progression    Ambulation/Gait               General Gait Details: unsafe/unable   Stairs             Wheelchair Mobility     Tilt Bed    Modified Rankin (Stroke Patients Only)       Balance Overall balance assessment: Needs assistance Sitting-balance support: No upper extremity supported, Feet supported Sitting balance-Leahy Scale: Poor Sitting balance - Comments: R post/lateral lean, mod pushing behaviors with correction towards midline   Standing balance support: Bilateral upper extremity supported Standing balance-Leahy Scale: Zero  Cognition Arousal:  (awake) Behavior During Therapy: Restless Overall Cognitive Status: No family/caregiver present to determine baseline cognitive functioning                                 General Comments:  Patient does state name; additional speech unintelligible and incomprehensible        Exercises Other Exercises Other Exercises: Unsupported sitting balance, mod/max assist-incorporated lateral weight shift, leaning onto L elbow for improved midline orientation. Unable to maintain without constant physical assist Other Exercises: Transitioned to chair position in bed and set up with meal tray (at bedside from lunch).  Patient dependant for feeding.  Unable to attend to utensil in R UE, unable to manipulate and stabilize utensil.  Dep assist for feeding.  Appears to have significant oral apraxia; manages food once in oral cavity, but inconsistent ability to purposefully open/close mouth to utensil, straw with meal.    General Comments        Pertinent Vitals/Pain Pain Assessment Pain Assessment: PAINAD Breathing: normal Negative Vocalization: none Facial Expression: smiling or inexpressive Body Language: relaxed Consolability: no need to console PAINAD Score: 0    Home Living                          Prior Function            PT Goals (current goals can now be found in the care plan section) Acute Rehab PT Goals PT Goal Formulation: Patient unable to participate in goal setting Time For Goal Achievement: 08/10/23 Potential to Achieve Goals: Fair Progress towards PT goals: Not progressing toward goals - comment    Frequency    Min 1X/week      PT Plan      Co-evaluation              AM-PAC PT "6 Clicks" Mobility   Outcome Measure  Help needed turning from your back to your side while in a flat bed without using bedrails?: Total Help needed moving from lying on your back to sitting on the side of a flat bed without using bedrails?: Total Help needed moving to and from a bed to a chair (including a wheelchair)?: Total Help needed standing up from a chair using your arms (e.g., wheelchair or bedside chair)?: Total Help needed to walk in hospital  room?: Total Help needed climbing 3-5 steps with a railing? : Total 6 Click Score: 6    End of Session   Activity Tolerance:  (Limited secondary to cognitive deficits) Patient left: in bed;with call bell/phone within reach;with bed alarm set (chair position in bed, nurse at bedside to finish meal with patient) Nurse Communication: Mobility status PT Visit Diagnosis: Muscle weakness (generalized) (M62.81);Other abnormalities of gait and mobility (R26.89);Difficulty in walking, not elsewhere classified (R26.2)     Time: 1610-9604 PT Time Calculation (min) (ACUTE ONLY): 46 min  Charges:    $Therapeutic Activity: 38-52 mins PT General Charges $$ ACUTE PT VISIT: 1 Visit                    Luanne Krzyzanowski H. Manson Passey, PT, DPT, NCS 07/27/23, 10:32 PM [phone]

## 2023-07-27 NOTE — Progress Notes (Signed)
PROGRESS NOTE  Justin Bird    DOB: 1940/10/27, 83 y.o.  VWU:[id number]    Code Status: Full Code   DOA: 06/29/2023   LOS: 27   Brief hospital course  Justin Bird is a 83 y.o. male with medical history significant of hard of hearing, depression, Parkinson's disease, who presents with altered mental status. Per daughter, at baseline patient is oriented to person and place, usually is confused about the time. In the past 4 days, patient has been confused so brought to ED.   01/02: admitted to hospitalist service w/ AMS, neurology consult. Of note, cannot do MRI due to presence of cochlear implant.  01/04: initial psych consult - they have been intermittently following  01/05-01/27: placement pending and approved to go 01/24 but was agitated and EMS unable to take him that evening.  He was here through the weekend, SNF was unable to take him 01/27, looking into other options. 01/28-1/30: Patient has been calm past several days, cooperative while resting.  Significant hard of hearing, have not had his cochlear implant in, staff instructed to use this when working with him. Working on placement with TOC.  Family goal is still to get him to [facility] because that is where his wife is, that is where they are wanting to eventually set him up with LTC  Assessment & Plan  Principal Problem:   Acute metabolic encephalopathy Active Problems:   Parkinson's disease (HCC)  Acute encephalopathy Metabolic/Toxic causes reasonably ruled out  Etiology is not clear. Ddx most c/w advancing dementia / vascular dementia, mild B12 deficiency but should not cause this, missed Sinemet, which may have contributed partially due to withdrawal. Metabolic and infectious workup have been negative  Psychiatric medications:  Continue seroquel 150mg  oral 3 times daily Continue trazodone 150mg  oral daily at bedtime Continue depakote sprinkle 250mg  oral every 12 hours Continue lexapro 5mg  oral daily Continue  ativan 1 mg increased to TID not a candidate for inpatient psych admission according to psych team Patient remains on Sinemet for his Parkinson's. 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  No known history of BPH.  Flomax was considered however patient has severe allergic reaction to sulfa medications including anaphylaxis. Foley removed and incontinent  Continue to monitor urine output closely  Body mass index is 26.3 kg/m.  VTE ppx: enoxaparin (LOVENOX) injection 40 mg Start: 06/29/23 2200  Diet:     Diet   Diet regular Room service appropriate? No; Fluid consistency: Thin   Consultants: Psych Palliative   Subjective 07/27/23    Pt reports nothing distinguishable. Wakes up to touch and falls back to sleep. Appears to be in NAD. Recently had ativan.   Objective   Vitals:   07/26/23 0823 07/26/23 1558 07/26/23 2030 07/27/23 0329  BP: 116/77 134/81 116/69 123/82  Pulse: 80 92 91 82  Resp: 18 16 19 16   Temp: 98.4 F (36.9 C)  97.6 F (36.4 C) 97.6 F (36.4 C)  TempSrc:    Oral  SpO2: 97% 94% 95% 96%  Weight:      Height:  No intake or output data in the 24 hours ending 07/27/23 0729  Filed Weights   07/12/23 0857  Weight: 71.7 kg    Physical Exam:  General: asleep, NAD HEENT: atraumatic, clear conjunctiva, anicteric sclera, MMM, hard of hearing Respiratory: normal respiratory effort. Cardiovascular: quick capillary refill, normal S1/S2, RRR, no JVD, murmurs Gastrointestinal: soft, NT, ND Nervous: unable to answer  orientation questions. Extremities: moves all equally, no edema, normal tone Skin: dry, intact, normal temperature, normal color. No rashes, lesions or ulcers on exposed skin Psychiatry: flat.  Labs   I have personally reviewed the following labs and imaging studies CBC    Component Value Date/Time   WBC 8.8 07/19/2023 0520   RBC 5.63 07/19/2023 0520   HGB 16.7 07/19/2023 0520   HCT 50.4 07/19/2023 0520   PLT 318 07/19/2023 0520   MCV 89.5 07/19/2023 0520   MCH 29.7 07/19/2023 0520   MCHC 33.1 07/19/2023 0520   RDW 12.7 07/19/2023 0520   LYMPHSABS 1.1 07/19/2023 0520   MONOABS 0.8 07/19/2023 0520   EOSABS 0.2 07/19/2023 0520   BASOSABS 0.0 07/19/2023 0520      Latest Ref Rng & Units 07/19/2023    5:20 AM 07/18/2023    5:00 AM 07/17/2023    4:52 AM  BMP  Glucose 70 - 99 mg/dL 604  75  540   BUN 8 - 23 mg/dL 16  19  18    Creatinine 0.61 - 1.24 mg/dL 9.81  1.91  4.78   Sodium 135 - 145 mmol/L 141  141  141   Potassium 3.5 - 5.1 mmol/L 3.7  3.6  3.6   Chloride 98 - 111 mmol/L 102  104  105   CO2 22 - 32 mmol/L 27  26  27    Calcium 8.9 - 10.3 mg/dL 9.0  8.7  9.0     No results found.  Disposition Plan & Communication  Patient status: Inpatient  Admitted From: Home Planned disposition location: Skilled nursing facility Anticipated discharge date: 1/31 pending dispo  Family Communication: none at bedside    Author: Leeroy Bock, DO [facility] 07/27/2023, 7:29 AM   Available by Epic secure chat 7AM-7PM. If 7PM-7AM, please contact night-coverage.  [facility] contact information found on [email or web address].

## 2023-07-27 NOTE — Progress Notes (Signed)
Palliative Care Progress Note, Assessment & Plan   Patient Name: Justin Bird       Date: 07/27/2023 DOB: December 14, 1940  Age: 83 y.o. MRN#: [id number] Attending Physician: Leeroy Bock, MD Primary Care Physician: Dorothey Baseman, MD Admit Date: 06/29/2023  Subjective: Patient is lying in bed, resting, in NAD.  He easily awakens to my presence.  He makes no vocalizations during my visit.  No agitation noted.  No family or friends present during my visit.  HPI: 83 y.o. male  with past medical history of depression, Parkinson's, and HOH admitted on 06/29/2023 with AMS.   Over course of now 25-day hospitalization, patient has been treated for acute metabolic encephalopathy (CT of head negative for acute intracranial abnormalities), vitamin B12 deficiency, Parkinson's, and acute urinary retention. Placement was pending and approved for him to transfer to [facility] on 1/24.  However, patient became agitated and EMS was unable to take him that evening.  TOC following closely for discharge planning.   PMT was consulted to discuss boundaries and goals of care.  Summary of counseling/coordination of care: Extensive chart review completed prior to meeting patient including labs, vital signs, imaging, progress notes, orders, and available advanced directive documents from current and previous encounters.   After reviewing the patient's chart and assessing the patient at bedside, I spoke with patient in regards to symptom management and goals of care.  He is able to confirm he is not in pain.  However, complete symptom assessment unable to be completed at this time.  Patient is minimally interactive and unable to participate in medical decision-making or goals of care at this time.  Nonverbal signs of pain  or distress not noted.  No adjustment to mark needed at this time.  After assessing the patient, I counseled with attending Dr. Dareen Piano and [facility] Darrian.  TOC following closely for discharge planning and has been in touch with multiple family members cross multiple days in regards to plan of care.  Plan remains for patient to hopefully transfer to [facility]. TOC following closely.  No acute palliative needs at this time.  PMT will step back from daily visits but remain available to patient/family throughout this hospitalization.  Please re-engage with PMT if goals change, at patient's/family's request, if patient's health deteriorates during hospitalization, or if other acute palliative needs arise.  Physical Exam Vitals reviewed.  Constitutional:      General: He is not in acute distress.    Appearance: He is normal weight.  HENT:     Head: Normocephalic.     Mouth/Throat:     Mouth: Mucous membranes are moist.  Eyes:     Pupils: Pupils are equal, round, and reactive to light.  Pulmonary:     Effort: Pulmonary effort is normal.  Abdominal:     Palpations: Abdomen is soft.  Skin:    General: Skin is warm and dry.  Neurological:     Mental Status: He is alert.  Psychiatric:        Mood and Affect: Mood normal.        Behavior: Behavior normal.        Thought Content: Thought content normal.  Total Time 25 minutes   Time spent includes: Detailed review of medical records (labs, imaging, vital signs), medically appropriate exam (mental status, respiratory, cardiac, skin), discussed with treatment team, counseling and educating patient, family and staff, documenting clinical information, medication management and coordination of care.  Samara Deist L. Bonita Quin, DNP, FNP-BC Palliative Medicine Team

## 2023-07-27 NOTE — Plan of Care (Signed)

## 2023-07-27 NOTE — Evaluation (Addendum)
Physical Therapy Evaluation Patient Details Name: Justin [facility] MRN: [id number] DOB: [birth year] Today's Date: 07/27/2023  History of Present Illness  Pt is an 83 y/o M admitted on 06/29/23 after presenting with AMS; admitted for management of acute encephalopathy, unknown etiology. Head CT negative for acute issues (unable to obtain MRI 2/2 cochlear implant). PMH: HOH with cochlear implant, depression, Parkinson's disease  Clinical Impression  Patient resting in bed upon arrival to room; sleeping, but does awaken with mod tactile, verbal cuing.  Patient with limited ability to participate with formal cognitive assessment; appears oriented to self only.  Limited ability to follow simple commands, complicated by profound hearing deficits and cognitive deficits (hearing devices not functioning during session).  Patient frequently verbalizing throughout session, but speech largely garbled and unintelligible.  No clinical indicators of pain appreciated. Tends to list towards R in all postures (supine, unsupported sitting) with minimal/no righting reactions.  Demonstrates spontaneous active movement throughout all extremities, but decreased active use, functional coordination of R hemi-body compared to L. Currently requiring total assist +2 for bed mobility; mod/max assist for unsupported sitting balance.  Demonstrates persistent R post/lateral lean in unsupported sitting, mod/max assist to recover/maintain midline.  Did attempt standing, but unable to fully achieve despite max/total assist +2 for lift off and movement facilitation.  Patient generally resistant to mobility efforts, demonstrating mild agitation with continued efforts.  Returned to supine for calming, total assist +2. Will plan to trial additional session with hearing devices in place once family/caregivers present to charge and apply to further assess ability to actively participate with skilled PT interventions.      If plan is discharge  home, recommend the following: Two people to help with walking and/or transfers;Two people to help with bathing/dressing/bathroom;Assistance with cooking/housework;Assistance with feeding;Direct supervision/assist for medications management;Direct supervision/assist for financial management;Assist for transportation;Help with stairs or ramp for entrance;Supervision due to cognitive status   Can travel by private vehicle   No    Equipment Recommendations    Recommendations for Other Services       Functional Status Assessment Patient has had a recent decline in their functional status and/or demonstrates limited ability to make significant improvements in function in a reasonable and predictable amount of time     Precautions / Restrictions Precautions Precautions: Fall Precaution Comments: L cochlear implant, R hearing aide, decreased vision Restrictions Weight Bearing Restrictions Per Provider Order: No      Mobility  Bed Mobility Overal bed mobility: Needs Assistance Bed Mobility: Supine to Sit, Sit to Supine     Supine to sit: Total assist, +2 for physical assistance Sit to supine: Total assist, +2 for physical assistance   General bed mobility comments: hand-over-hand assist to initiate and complete all movement; intermittently resistant to all movement    Transfers Overall transfer level: Needs assistance Equipment used: 2 person hand held assist Transfers: Sit to/from Stand Sit to Stand: Total assist, +2 physical assistance           General transfer comment: generally resistant to manual assist/facilitation for sit/stand with bilat HHA; lifts buttocks, but unable to achieve full stand.  Patient mildly agitated with continued efforts; unsafe to attempt further    Ambulation/Gait               General Gait Details: unsafe/unable  Stairs            Wheelchair Mobility     Tilt Bed    Modified Rankin (Stroke Patients Only)  Balance  Overall balance assessment: Needs assistance Sitting-balance support: No upper extremity supported, Feet supported Sitting balance-Leahy Scale: Poor Sitting balance - Comments: R post/lateral lean, resistant to correction Postural control: Right lateral lean   Standing balance-Leahy Scale: Zero                               Pertinent Vitals/Pain Pain Assessment Pain Assessment: PAINAD Breathing: normal Negative Vocalization: none Facial Expression: smiling or inexpressive Body Language: relaxed Consolability: no need to console PAINAD Score: 0    Home Living Family/patient expects to be discharged to:: Unsure                   Additional Comments: No family/caregiver available to confirm PLOF or home set up. Per chart, pt living at home alone, adult children [place] of [place].    Prior Function Prior Level of Function : Patient poor historian/Family not available                     Extremity/Trunk Assessment   Upper Extremity Assessment Upper Extremity Assessment:  (grossly at least 4/5 as noted through spontaneous movement; decreased active use, decreased purposeful coordination of R)    Lower Extremity Assessment Lower Extremity Assessment:  (grossly at least 4/5 as noted through spontaneous movement; decreased active use, decreased purposeful coordination of R)       Communication      Cognition Arousal:  (awake) Behavior During Therapy: Restless Overall Cognitive Status: No family/caregiver present to determine baseline cognitive functioning                                          General Comments      Exercises Other Exercises Other Exercises: Total assist to change gown (due to bladder incontinence); patient very fidgety and restless, often grabbing/pulling at linens in the process    Assessment/Plan    PT Assessment    PT Problem List Decreased cognition;Decreased mobility;Decreased balance;Decreased  safety awareness       PT Treatment Interventions      PT Goals (Current goals can be found in the Care Plan section)  Acute Rehab PT Goals PT Goal Formulation: Patient unable to participate in goal setting Potential to Achieve Goals: Poor    Frequency  1x/week     Co-evaluation               AM-PAC PT "6 Clicks" Mobility  Outcome Measure Help needed turning from your back to your side while in a flat bed without using bedrails?: Total Help needed moving from lying on your back to sitting on the side of a flat bed without using bedrails?: Total Help needed moving to and from a bed to a chair (including a wheelchair)?: Total Help needed standing up from a chair using your arms (e.g., wheelchair or bedside chair)?: Total Help needed to walk in hospital room?: Total Help needed climbing 3-5 steps with a railing? : Total 6 Click Score: 6    End of Session   Activity Tolerance:  (Treatment limited by cognitive deficits) Patient left: in bed;with call bell/phone within reach;with bed alarm set Nurse Communication: Mobility status PT Visit Diagnosis: Muscle weakness (generalized) (M62.81);Other abnormalities of gait and mobility (R26.89);Difficulty in walking, not elsewhere classified (R26.2)    Time: 8469-6295 PT Time Calculation (min) (  ACUTE ONLY): 19 min   Charges:   PT Evaluation $PT Re-evaluation: 1 Re-eval   PT General Charges $$ ACUTE PT VISIT: 1 Visit         Bellamy Rubey H. Manson Passey, PT, DPT, NCS 07/27/23, 11:33 AM [phone]

## 2023-07-28 DIAGNOSIS — G9341 Metabolic encephalopathy: Secondary | ICD-10-CM | POA: Diagnosis not present

## 2023-07-28 DIAGNOSIS — R4182 Altered mental status, unspecified: Secondary | ICD-10-CM | POA: Diagnosis not present

## 2023-07-28 DIAGNOSIS — Z515 Encounter for palliative care: Secondary | ICD-10-CM | POA: Diagnosis not present

## 2023-07-28 NOTE — Progress Notes (Signed)
PROGRESS NOTE  Justin Bird    DOB: [birth year], 83 y.o.  WUJ:[id number]    Code Status: Full Code   DOA: 06/29/2023   LOS: 28   Brief hospital course  Justin Bird is a 83 y.o. male with medical history significant of hard of hearing, depression, Parkinson's disease, who presents with altered mental status. Per daughter, at baseline patient is oriented to person and place, usually is confused about the time. In the past 4 days, patient has been confused so brought to ED.   01/02: admitted to hospitalist service w/ AMS, neurology consult. Of note, cannot do MRI due to presence of cochlear implant.  01/04: initial psych consult - they have been intermittently following  01/05-01/27: placement pending and approved to go 01/24 but was agitated and EMS unable to take him that evening.  He was here through the weekend, SNF was unable to take him 01/27, looking into other options. 01/28-1/31: Patient has been calm past several days, cooperative while resting.  Significant hard of hearing, cochlear implant installed properly by prior caregiver on 1/31. Working on placement with TOC.  Family goal is still to get him to [facility] because that is where his wife is, that is where they are wanting to eventually set him up with LTC  Assessment & Plan  Principal Problem:   Acute metabolic encephalopathy Active Problems:   Parkinson's disease (HCC)  Acute encephalopathy Metabolic/Toxic causes reasonably ruled out  Etiology is not clear. Ddx most c/w advancing dementia / vascular dementia, mild B12 deficiency but should not cause this, missed Sinemet, which may have contributed partially due to withdrawal. Metabolic and infectious workup have been negative  Psychiatric medications:  Continue seroquel 150mg  oral 3 times daily Continue trazodone 150mg  oral daily at bedtime Continue depakote sprinkle 250mg  oral every 12 hours Continue lexapro 5mg  oral daily Continue ativan 1 mg increased to  TID not a candidate for inpatient psych admission according to psych team Patient remains on Sinemet for his Parkinson's. 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  No known history of BPH.  Flomax was considered however patient has severe allergic reaction to sulfa medications including anaphylaxis. Foley removed and incontinent  Continue to monitor urine output closely  Body mass index is 26.3 kg/m.  VTE ppx: enoxaparin (LOVENOX) injection 40 mg Start: 06/29/23 2200  Diet:     Diet   Diet regular Room service appropriate? No; Fluid consistency: Thin   Consultants: Psych Palliative   Subjective 07/28/23    Pt sedated but awakens with nudging. Follows command to open eyes when talking with his caregiver at bedside.    Objective   Vitals:   07/27/23 0813 07/27/23 1612 07/27/23 2046 07/28/23 0325  BP: (!) 143/72 110/64 113/64 (!) 100/46  Pulse: 82 92 87 77  Resp: 18 16 18 17   Temp: (!) 97.4 F (36.3 C) 97.6 F (36.4 C) 98.6 F (37 C) 98.8 F (37.1 C)  TempSrc:    Oral  SpO2: 95% 94% 94% 93%  Weight:      Height:  Intake/Output Summary (Last 24 hours) at 07/28/2023 0730 Last data filed at 07/27/2023 1900 Gross per 24 hour  Intake 120 ml  Output --  Net 120 ml    Filed Weights   07/12/23 0857  Weight: 71.7 kg    Physical Exam:  General: asleep, NAD HEENT: atraumatic, clear conjunctiva, anicteric sclera, MMM, hard of hearing Respiratory: normal respiratory effort. Cardiovascular: quick capillary refill, normal S1/S2,  RRR, no JVD, murmurs Gastrointestinal: soft, NT, ND Nervous: unable to answer orientation questions. Extremities: moves all equally, no edema, normal tone Skin: dry, intact, normal temperature, normal color. No rashes, lesions or ulcers on exposed skin Psychiatry: flat.  Labs   I have personally reviewed the following labs and imaging studies CBC    Component Value Date/Time   WBC 8.8 07/19/2023 0520   RBC 5.63 07/19/2023 0520   HGB 16.7 07/19/2023 0520   HCT 50.4 07/19/2023 0520   PLT 318 07/19/2023 0520   MCV 89.5 07/19/2023 0520   MCH 29.7 07/19/2023 0520   MCHC 33.1 07/19/2023 0520   RDW 12.7 07/19/2023 0520   LYMPHSABS 1.1 07/19/2023 0520   MONOABS 0.8 07/19/2023 0520   EOSABS 0.2 07/19/2023 0520   BASOSABS 0.0 07/19/2023 0520      Latest Ref Rng & Units 07/19/2023    5:20 AM 07/18/2023    5:00 AM 07/17/2023    4:52 AM  BMP  Glucose 70 - 99 mg/dL 045  75  409   BUN 8 - 23 mg/dL 16  19  18    Creatinine 0.61 - 1.24 mg/dL 8.11  9.14  7.82   Sodium 135 - 145 mmol/L 141  141  141   Potassium 3.5 - 5.1 mmol/L 3.7  3.6  3.6   Chloride 98 - 111 mmol/L 102  104  105   CO2 22 - 32 mmol/L 27  26  27    Calcium 8.9 - 10.3 mg/dL 9.0  8.7  9.0     No results found.  Disposition Plan & Communication  Patient status: Inpatient  Admitted From: Home Planned disposition location: Skilled nursing facility Anticipated discharge date: 1/31 pending dispo  Family Communication: caregiver at bedside    Author: Leeroy Bock, DO [facility] 07/28/2023, 7:30 AM   Available by Epic secure chat 7AM-7PM. If 7PM-7AM, please contact night-coverage.  [facility] contact information found on [email or web address].

## 2023-07-28 NOTE — Plan of Care (Signed)
  Problem: Education: Goal: Knowledge of General Education information will improve Description: Including pain rating scale, medication(s)/side effects and non-pharmacologic comfort measures Outcome: Progressing   Problem: Clinical Measurements: Goal: Ability to maintain clinical measurements within normal limits will improve Outcome: Progressing Goal: Will remain free from infection Outcome: Progressing Goal: Diagnostic test results will improve Outcome: Progressing Goal: Respiratory complications will improve Outcome: Progressing Goal: Cardiovascular complication will be avoided Outcome: Progressing   Problem: Coping: Goal: Level of anxiety will decrease Outcome: Progressing   Problem: Elimination: Goal: Will not experience complications related to bowel motility Outcome: Progressing Goal: Will not experience complications related to urinary retention Outcome: Progressing   Problem: Pain Managment: Goal: General experience of comfort will improve and/or be controlled Outcome: Progressing   Problem: Safety: Goal: Ability to remain free from injury will improve Outcome: Progressing

## 2023-07-28 NOTE — Plan of Care (Signed)

## 2023-07-29 DIAGNOSIS — G9341 Metabolic encephalopathy: Secondary | ICD-10-CM | POA: Diagnosis not present

## 2023-07-29 MED ORDER — QUETIAPINE FUMARATE 25 MG PO TABS: 125.0000 mg | ORAL_TABLET | Freq: Three times a day (TID) | ORAL | Status: DC

## 2023-07-29 MED ORDER — QUETIAPINE FUMARATE 25 MG PO TABS
125.0000 mg | ORAL_TABLET | Freq: Three times a day (TID) | ORAL | Status: DC
  Administered 2023-07-29 – 2023-07-30 (×3): 125 mg via ORAL
  Filled 2023-07-29: qty 5

## 2023-07-29 NOTE — Plan of Care (Signed)
  Problem: Nutrition: Goal: Adequate nutrition will be maintained Outcome: Progressing   Problem: Safety: Goal: Ability to remain free from injury will improve Outcome: Progressing   

## 2023-07-29 NOTE — Progress Notes (Signed)
PROGRESS NOTE    Justin Bird  UEA:[id number]  DOB: Oct 23, 1940  DOA: 06/29/2023 PCP: Justin Baseman, MD Outpatient Specialists:   Hospital course:  Justin Bird is a 83 y.o. male with medical history significant of hard of hearing, depression, Parkinson's disease, who presents with altered mental status. Per daughter, at baseline patient is oriented to person and place, usually is confused about the time. In the past 4 days, patient has been confused so brought to ED.   01/02: admitted to hospitalist service w/ AMS, neurology consult. Of note, cannot do MRI due to presence of cochlear implant.  01/04: initial psych consult - they have been intermittently following  01/05-01/27: placement pending and approved to go 01/24 but was agitated and EMS unable to take him that evening.  He was here through the weekend, SNF was unable to take him 01/27, looking into other options. 01/28-1/31: Patient has been calm past several days, cooperative while resting.  Significant hard of hearing, cochlear implant installed properly by prior caregiver on 1/31. Working on placement with TOC.  Family goal is still to get him to [facility] because that is where his wife is, that is where they are wanting to eventually set him up with LTC  Subjective:  Patient sleeping carb plate when I going to see him.  He is arousable but only answers yes when I ask him if he is okay.  Patient's caretaker [facility] Bird is at bedside.  He notes that he is distressed to see Justin Bird in this condition.  Apparently patient was doing well until patient's family discontinued all his medications including his Parkinson's medications which caused him to be acutely disoriented and unstable and resulted in the hospitalization.  He also tells me that patient's son Justin Bird would like to have his Seroquel decreased.  Spoke with son [facility] and his wife who is in the background.  They are very frustrated that patient is on  such high doses of medications because apparently they were told by [facility] that they would not accept the patient as long as he was on Seroquel.  Patient's son Justin Bird would like for the patient to come off of the Seroquel.  We discussed that this would not be safe either for the patient or for the staff.  I noted it would be easier to wean him off the Seroquel if family or Justin Bird could be at bedside to help redirect him when and if he were to get agitated.  Son Justin Bird notes that no family is able to come up and that they cannot afford to have [facility] at bedside however he would like for the Seroquel to be discontinued.  I agreed to try to decrease the dose of Seroquel given high doses of 150 3 times daily, however this was the dose recommended by psychiatry.  Also, given multiple questions and concerns, I recommended they get MyChart so they have a better understanding of what is being done.   Objective: Vitals:   07/28/23 1608 07/28/23 2027 07/29/23 0250 07/29/23 0945  BP: 118/70 131/70 105/64 136/83  Pulse: 90 88 85 84  Resp: 16 18 18 16   Temp: 98 F (36.7 C) 98.3 F (36.8 C) 98.6 F (37 C) 98.3 F (36.8 C)  TempSrc:      SpO2: 99% 96% 92% 95%  Weight:      Height:       No intake or output data in the 24 hours ending 07/29/23 1536 Filed Weights  07/12/23 0857  Weight: 71.7 kg     Exam:  General: Patient lying comfortably in bed with [facility] at bedside. CVS: Distant heart sounds, regular Respiratory:  decreased air entry bilaterally secondary to decreased inspiratory effort GI: Positive bowel sounds, firm but not hard, not tender LE: Warm and well-perfused Neuro: Asleep, arousable to voice alone, answers yes/no questions appropriately  Data Reviewed:  Basic Metabolic Panel: No results for input(s): "NA", "K", "CL", "CO2", "GLUCOSE", "BUN", "CREATININE", "CALCIUM", "MG", "PHOS" in the last 168 hours.  CBC: No results for input(s): "WBC", "NEUTROABS", "HGB", "HCT", "MCV",  "PLT" in the last 168 hours.   Scheduled Meds:  bisacodyl  10 mg Oral QHS   carbidopa-levodopa  2 tablet Oral TID   cyanocobalamin  1,000 mcg Oral Daily   divalproex  250 mg Oral Q12H   doxazosin  1 mg Oral Daily   enoxaparin (LOVENOX) injection  40 mg Subcutaneous Q24H   escitalopram  5 mg Oral Daily   feeding supplement  237 mL Oral BID BM   latanoprost  1 drop Both Eyes QHS   LORazepam  1 mg Oral Q8H   polyethylene glycol  17 g Oral BID   QUEtiapine  150 mg Oral TID   traZODone  150 mg Oral QHS   Continuous Infusions:   Assessment & Plan:   Acute metabolic encephalopathy Disposition Etiology of encephalopathy is not clear, most likely secondary to advanced dementia/Parkinson's dementia/vascular dementia and withdrawal from Sinemet which was acutely for unclear reasons. Metabolic and infectious workups have been negative. As noted above, as per discussion with son Justin Bird is unhappy with the amount of sedation patient is currently under.  He would like the patient to come off the Seroquel because apparently he was told by [facility] that they will not except the patient on Seroquel.  Family referred to [facility] for further discussion about SNF bed placement/availability. Decrease quetiapine to 125 3 times daily, down from 150 3 times daily and assess how patient is doing As noted above, I have recommended to son and daughter-in-law that it would be best if family could be at bedside as we try to wean him off the medication given concern for agitation and aggression.  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  No known history of BPH.  Flomax was considered however patient has severe allergic reaction to sulfa medications including anaphylaxis. Foley removed and incontinent  Continue to monitor urine output closely   DVT prophylaxis: Lovenox Code Status: Full Family Communication: Long discussion with patient's son Justin Bird as documented above under subjective.  Patient's family is unhappy with the communication they have received and with the medications he is on and with the fact that he has not been placed in [place] [facility] yet.  In terms of communication/better understanding of patient's complex medical problems, they note they are unable to come up and see him and also note they were unable to download MyChart for improved communication.     Studies: No results found.  Principal Problem:  Acute metabolic encephalopathy Active Problems:   Parkinson's disease (HCC)     Dayden Viverette Orma Flaming, [facility]  If 7PM-7AM, please contact night-coverage [email or web address]   LOS: 29 days

## 2023-07-30 DIAGNOSIS — G9341 Metabolic encephalopathy: Secondary | ICD-10-CM | POA: Diagnosis not present

## 2023-07-30 MED ORDER — QUETIAPINE FUMARATE 25 MG PO TABS
100.0000 mg | ORAL_TABLET | Freq: Three times a day (TID) | ORAL | Status: DC
  Administered 2023-07-30 – 2023-08-03 (×9): 100 mg via ORAL
  Filled 2023-07-30 (×8): qty 4

## 2023-07-30 NOTE — Progress Notes (Signed)
PROGRESS NOTE    Justin Bird  ZOX:[id number]  DOB: [birth year]  DOA: 06/29/2023 PCP: Dorothey Baseman, MD Outpatient Specialists:   Hospital course:  Justin Bird is a 83 y.o. male with medical history significant of hard of hearing, depression, Parkinson's disease, who presents with altered mental status.  Per patient's caregiver Justin Bird, patient had been doing well until family abruptly discontinued his Sinemet for unclear reasons, and he subsequently developed confusion/altered mental status for which she presented to ED.   01/02: admitted to hospitalist service w/ AMS, neurology consult. Of note, cannot do MRI due to presence of cochlear implant.  01/04: initial psych consult - they have been intermittently following  01/05-01/27: placement pending and approved to go 01/24 but was agitated and EMS unable to take him that evening.  He was here through the weekend, SNF was unable to take him 01/27, looking into other options. 01/28-1/31: Patient has been calm past several days, cooperative while resting.  Significant hard of hearing, cochlear implant installed properly by prior caregiver on 1/31. Working on placement with TOC.  Family goal is still to get him to [facility] because that is where his wife is, that is where they are wanting to eventually set him up with LTC 2-1: Conversation with patient's son who is very concerned that he is being overmedicated and that this is what is causing him to not be able to go to [facility].  I agreed to decrease Seroquel closely but noted my concern about recurrent agitation/aggression.  Subjective:  Patient is still somnolent this morning.  He is arousable but again only answers yes/no questions and mumbles.  No meaningful conversation.  Objective: Vitals:   07/29/23 1608 07/29/23 1953 07/30/23 0418 07/30/23 1332  BP: 107/66 115/78 130/79 122/81  Pulse: 82 82 79 94  Resp: 18 18 18    Temp: 98.2 F (36.8 C) 98.5 F  (36.9 C) 97.8 F (36.6 C) 97.8 F (36.6 C)  TempSrc:      SpO2: 98% 100% 94% 92%  Weight:      Height:        Intake/Output Summary (Last 24 hours) at 07/30/2023 1414 Last data filed at 07/29/2023 1900 Gross per 24 hour  Intake 0 ml  Output --  Net 0 ml   Filed Weights   07/12/23 0857  Weight: 71.7 kg     Exam:  General: Patient lying comfortably in bed at 20 degrees in NAD CVS: Distant heart sounds, regular Respiratory:  decreased air entry bilaterally secondary to decreased inspiratory effort GI: Positive bowel sounds, firm but not hard, not tender LE: Warm and well-perfused Neuro: Asleep, arousable by touch, answers mumbled yes or no  Data Reviewed:  Basic Metabolic Panel: No results for input(s): "NA", "K", "CL", "CO2", "GLUCOSE", "BUN", "CREATININE", "CALCIUM", "MG", "PHOS" in the last 168 hours.  CBC: No results for input(s): "WBC", "NEUTROABS", "HGB", "HCT", "MCV", "PLT" in the last 168 hours.   Scheduled Meds:  bisacodyl  10 mg Oral QHS   carbidopa-levodopa  2 tablet Oral TID   cyanocobalamin  1,000 mcg Oral Daily   divalproex  250 mg Oral Q12H   doxazosin  1 mg Oral Daily   enoxaparin (LOVENOX) injection  40 mg Subcutaneous Q24H   escitalopram  5 mg Oral Daily   feeding supplement  237 mL Oral BID BM   latanoprost  1 drop Both Eyes QHS   LORazepam  1 mg Oral Q8H   polyethylene glycol  17 g Oral  BID   QUEtiapine  100 mg Oral TID   traZODone  150 mg Oral QHS   Continuous Infusions:   Assessment & Plan:   Acute metabolic encephalopathy Disposition Etiology of encephalopathy is not clear, most likely secondary to advanced dementia/Parkinson's dementia/vascular dementia and withdrawal from Sinemet which was acutely for unclear reasons. Metabolic and infectious workups have been negative. Will decrease Seroquel down from 125 3 times daily (down from 150 3 times daily) to 100 mg p.o. 3 times daily and follow somnolence. Will order EKG to assess QTc. As  per discussion with son Justin Bird on 2/1, he is unhappy with the amount of sedation patient is currently under.  He would like the patient to come off the Seroquel because apparently he was told by [facility] that they will not except the patient on Seroquel.  Family referred to [facility] for further discussion about SNF bed placement/availability. I have recommended to son and daughter-in-law that it would be best if family or his caretaker Justin Bird could be at bedside as we try to wean him off the medication given concern for agitation and aggression.  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  No known history of BPH.  Flomax was considered however patient has severe allergic reaction to sulfa medications including anaphylaxis. Foley removed and incontinent  Continue to monitor urine output closely   DVT prophylaxis: Lovenox Code Status: Full Family Communication: Long discussion with patient's son Justin Bird as documented above under subjective.  Patient's family is unhappy with the communication they have received and with the medications he is on and with the fact that he has not been placed in [place] [facility] yet.  In terms of communication/better understanding of patient's complex medical problems, they note they are unable to come up and see him and also note they were unable to download MyChart for improved communication.     Studies: No results  found.  Principal Problem:   Acute metabolic encephalopathy Active Problems:   Parkinson's disease (HCC)     Justin Bird Justin Bird, [facility]  If 7PM-7AM, please contact night-coverage [email or web address]   LOS: 30 days

## 2023-07-31 DIAGNOSIS — G9341 Metabolic encephalopathy: Secondary | ICD-10-CM | POA: Diagnosis not present

## 2023-07-31 DIAGNOSIS — G20A2 Parkinson's disease without dyskinesia, with fluctuations: Secondary | ICD-10-CM

## 2023-07-31 LAB — CBC
HCT: 49.9 % (ref 39.0–52.0)
Hemoglobin: 16 g/dL (ref 13.0–17.0)
MCH: 29.5 pg (ref 26.0–34.0)
MCHC: 32.1 g/dL (ref 30.0–36.0)
MCV: 91.9 fL (ref 80.0–100.0)
Platelets: 281 10*3/uL (ref 150–400)
RBC: 5.43 MIL/uL (ref 4.22–5.81)
RDW: 12.9 % (ref 11.5–15.5)
WBC: 7.7 10*3/uL (ref 4.0–10.5)
nRBC: 0 % (ref 0.0–0.2)

## 2023-07-31 LAB — BASIC METABOLIC PANEL
Anion gap: 11 (ref 5–15)
BUN: 27 mg/dL — ABNORMAL HIGH (ref 8–23)
CO2: 29 mmol/L (ref 22–32)
Calcium: 9.1 mg/dL (ref 8.9–10.3)
Chloride: 105 mmol/L (ref 98–111)
Creatinine, Ser: 0.72 mg/dL (ref 0.61–1.24)
GFR, Estimated: >60 mL/min (ref >60)
Glucose, Bld: 98 mg/dL (ref 70–99)
Potassium: 3.8 mmol/L (ref 3.5–5.1)
Sodium: 145 mmol/L (ref 135–145)

## 2023-07-31 NOTE — TOC Progression Note (Signed)
Transition of Care [facility]) - Progression Note    Patient Details  Name: Justin Bird MRN: [id number] Date of Birth: 1940/08/06  Transition of Care [facility]) CM/SW Contact  Allena Katz, LCSW Phone Number: 07/31/2023, 12:25 PM  Clinical Narrative:   CSW spoke with son and his wife [name] who states that [facility] as they feel like [facility] is stating [facility] are still able to [facility] pt. They would like to arrange a conference call with [facility] and myself. CSW has tried to reach out to [facility] but was unable to reach. Csw will conference family in when able to reach family.    Expected Discharge Plan: Skilled Nursing Facility Barriers to Discharge: Barriers Resolved  Expected Discharge Plan and Services       Living arrangements for the past 2 months: Single Family Home Expected Discharge Date: 07/22/23                                     Social Determinants of Health (SDOH) Interventions SDOH Screenings   Food Insecurity: Patient Unable To Answer (07/01/2023)  Housing: Patient Unable To Answer (07/10/2023)  Transportation Needs: No Transportation Needs (07/01/2023)  Utilities: Not At Risk (07/01/2023)  Financial Resource Strain: Patient Declined (01/04/2023)   Received from [facility]  Social Connections: Patient Unable To Answer (07/01/2023)  Tobacco Use: Low Risk  (06/30/2023)    Readmission Risk Interventions     No data to display

## 2023-07-31 NOTE — TOC Progression Note (Signed)
Transition of Care [facility]) - Progression Note    Patient Details  Name: Justin Bird MRN: [id number] Date of Birth: [birth year]  Transition of Care [facility]) CM/SW Contact  Allena Katz, LCSW Phone Number: 07/31/2023, 3:53 PM  Clinical Narrative:   CSW spoke with patients son and his wife michelle to let them know what LC has stated. Pt cannot have any medication adjustments, we have to make sure we try to get an authorization and we need to successfully get patient on the ambulance, as this is a trigger for him, family will have aide brian there to assist.    Expected Discharge Plan: Skilled Nursing Facility Barriers to Discharge: Barriers Resolved  Expected Discharge Plan and Services       Living arrangements for the past 2 months: Single Family Home Expected Discharge Date: 07/22/23                                     Social Determinants of Health (SDOH) Interventions SDOH Screenings   Food Insecurity: Patient Unable To Answer (07/01/2023)  Housing: Patient Unable To Answer (07/10/2023)  Transportation Needs: No Transportation Needs (07/01/2023)  Utilities: Not At Risk (07/01/2023)  Financial Resource Strain: Patient Declined (01/04/2023)   Received from [facility]  Social Connections: Patient Unable To Answer (07/01/2023)  Tobacco Use: Low Risk  (06/30/2023)    Readmission Risk Interventions     No data to display

## 2023-07-31 NOTE — Progress Notes (Signed)
PROGRESS NOTE  Justin Bird  ZOX:[id number]  DOB: [birth year]  DOA: 06/29/2023 PCP: Dorothey Baseman, MD  Hospital course:  Justin Bird is a 83 y.o. male with medical history significant of hard of hearing, depression, Parkinson's disease, who presents with altered mental status.  Per patient's caregiver Vinnie Langton, patient had been doing well until family abruptly discontinued his Sinemet for unclear reasons, and he subsequently developed confusion/altered mental status for which he presented to ED.   01/02: admitted to hospitalist service w/ AMS, neurology consult. Of note, cannot do MRI due to presence of cochlear implant.  01/04: initial psych consult - they have been intermittently following  01/05-01/27: placement pending and approved to go 01/24 but was agitated and EMS unable to take him that evening.  He was here through the weekend, SNF was unable to take him 01/27, looking into other options. 01/28-1/31: Patient has been calm past several days, cooperative while resting.  Significant hard of hearing, cochlear implant installed properly by prior caregiver on 1/31. Working on placement with TOC.  Family goal is still to get him to [facility] because that is where his wife is, that is where they are wanting to eventually set him up with LTC. Decreasing seroquel to decrease sedation and monitoring for agitation. Has not needed PRN medications over the weekend.   Subjective: Patient is alert but his eyes are closed and will not follow commands or verbally respond. Makes noises in response to physical stimuli and starts to cry when his caregiver at bedside tests his LE strength.   Objective: Vitals:   07/30/23 0418 07/30/23 1332 07/30/23 1628 07/30/23 2047  BP: 130/79 122/81 133/81 132/72  Pulse: 79 94 (!) 104 92  Resp: 18   16  Temp: 97.8 F (36.6 C) 97.8 F (36.6 C) 98.9 F (37.2 C) 98.4 F (36.9 C)  TempSrc:      SpO2: 94% 92% 92% 91%  Weight:      Height:        No intake or output data in the 24 hours ending 07/31/23 0729  Filed Weights   07/12/23 0857  Weight: 71.7 kg   Exam: General: Patient lying comfortably in bed at 20 degrees in NAD CVS: Distant heart sounds, regular Respiratory:  CTAB. GI: Positive bowel sounds, firm but not hard, not tender LE: Warm and well-perfused Neuro: Asleep, arousable by touch. Moving all of his extremities equally.   Data Reviewed:  Basic Metabolic Panel: Recent Labs  Lab 07/31/23 0540  NA 145  K 3.8  CL 105  CO2 29  GLUCOSE 98  BUN 27*  CREATININE 0.72  CALCIUM 9.1    CBC: Recent Labs  Lab 07/31/23 0540  WBC 7.7  HGB 16.0  HCT 49.9  MCV 91.9  PLT 281   Assessment & Plan:   Parkinson's Acute metabolic encephalopathy Disposition Etiology of encephalopathy is not clear, most likely secondary to advanced dementia/Parkinson's dementia/vascular dementia and withdrawal from Sinemet which was acutely for unclear reasons. Metabolic and infectious workups have been negative. - continue medications as recommended by psychiatry consult and gradually decrease seroquel to reduce sedation. - now 100mg  TID - Continue trazodone, Depakote, Lexapro, psychiatry has signed off - TOC following for placement with SNF. Planning to dc to SNF when bed available. 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  No known history of BPH.  Flomax was considered however patient has severe allergic reaction to sulfa medications including anaphylaxis. Foley removed and incontinent   Continue to monitor urine output closely   DVT prophylaxis: Lovenox Code Status: Full Family Communication: caregiver at bedside   Studies: No results found.  Principal Problem:   Acute metabolic encephalopathy Active Problems:   Parkinson's disease (HCC)     Mya Suell Jannette Fogo, [facility]  If 7PM-7AM, please contact night-coverage [email or web address]   LOS: 31 days

## 2023-07-31 NOTE — Plan of Care (Signed)

## 2023-08-01 DIAGNOSIS — G20B2 Parkinson's disease with dyskinesia, with fluctuations: Secondary | ICD-10-CM | POA: Diagnosis not present

## 2023-08-01 DIAGNOSIS — G9341 Metabolic encephalopathy: Secondary | ICD-10-CM | POA: Diagnosis not present

## 2023-08-01 NOTE — Plan of Care (Signed)

## 2023-08-01 NOTE — Progress Notes (Signed)
 PROGRESS NOTE Justin Bird  FMW:[id number]  DOB: 01/23/1941  DOA: 06/29/2023 PCP: Justin Lenis, MD  Hospital course: Justin Bird is a 83 y.o. male with medical history significant of hard of hearing, depression, Parkinson's disease, who presents with altered mental status.  Per patient's caregiver Justin Bird, patient had been doing well until family abruptly discontinued his Sinemet  for unclear reasons, and he subsequently developed confusion/altered mental status for which he presented to ED.   01/02: admitted to hospitalist service w/ AMS, neurology consult. Of note, cannot do MRI due to presence of cochlear implant.  01/04: initial psych consult - they have been intermittently following  01/05-01/27: placement pending and approved to go 1/24 but was agitated and EMS unable to take him that evening.  He was here through the weekend, SNF was unable to take him 1/27. 1/28-2/4: Patient has been calm past several days, cooperative while resting.  Significant hard of hearing, cochlear implant installed properly by prior caregiver on 1/31. Working on placement with TOC.  Family goal is still to get him to [facility] because that is where his wife is, that is where they are wanting to eventually set him up with LTC. Decreasing seroquel  to decrease sedation and monitoring for agitation. Has not needed PRN medications several days.  Subjective: Patient is alert. Does not open eyes to conversation or respond to tactile stimulus. Has had bowel incontinence in bed. Nurse notified. Caregiver at bedside.  Objective: Blood pressure 105/68, pulse 93, temperature 98.9 F (37.2 C), resp. rate 18, height 5' 5 (1.651 m), weight 71.7 kg, SpO2 92%.  Exam: General: Patient lying comfortably in bed at 20 degrees in NAD. Cochlear implant is not in place. CVS: Distant heart sounds, regular Respiratory:  CTAB. GI: soft, not tender LE: Warm and well-perfused Neuro: Moving all of his  extremities equally.   Data Reviewed:  Basic Metabolic Panel: Recent Labs  Lab 07/31/23 0540  NA 145  K 3.8  CL 105  CO2 29  GLUCOSE 98  BUN 27*  CREATININE 0.72  CALCIUM 9.1    CBC: Recent Labs  Lab 07/31/23 0540  WBC 7.7  HGB 16.0  HCT 49.9  MCV 91.9  PLT 281   Assessment & Plan:   Parkinson's  Acute metabolic encephalopathy Disposition Etiology of encephalopathy is not clear, most likely secondary to advanced dementia/Parkinson's dementia/vascular dementia and withdrawal from Sinemet  which has now been restarted. Metabolic and infectious workups have been negative. - continue medications as recommended by psychiatry consult and gradually decrease seroquel  to reduce sedation. - now 100mg  TID - Continue trazodone , Depakote , Lexapro , psychiatry has signed off - ativan  is scheduled for anxiety, agitation.  - TOC following for placement with SNF. Planning to dc to SNF when bed available. He has not needed PRN agitation medications >96 hours - recommend transporting timed for after he has received his anxiety medication when that comes. Have his daughter and caregiver present for transportation, have his wife at facility upon arrival, ensure his cochlear implant in place, and move him very gradually to avoid agitation/anxiety with transportation as happened on previous attempt.   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 There is report that patient was wearing the implant overnight and now is not present and staff unable to find it. Sounds like it was likely put in the laundry with the linens after being changed last night, unintentionally.  Vitamin B12 deficiency- borderline low 319 Continue vitamin B12 replacement   Parkinson's disease  Continue Sinemet  3 times daily home dose - PT/OT   Acute urinary  retention- foley Placed on 1/8 for urinary retention.  No known history of BPH.  Flomax was considered however patient has severe allergic reaction to sulfa medications including anaphylaxis. Foley removed and incontinent  Continue to monitor urine output closely   DVT prophylaxis: Lovenox  Code Status: Full Family Communication: caregiver at bedside. Spoke with son and daughter in law on phone  Studies: No results found.  Principal Problem:   Acute metabolic encephalopathy Active Problems:   Parkinson's disease (HCC)   Justin Bird, [facility]  If 7PM-7AM, please contact night-coverage [email or web address]   LOS: 32 days

## 2023-08-01 NOTE — TOC Progression Note (Signed)
 Transition of Care [facility]) - Progression Note    Patient Details  Name: Justin Bird MRN: [id number] Date of Birth: June 25, 1941  [facility]) CM/SW Contact  Ladene Lady, LCSW Phone Number: 08/01/2023, 5:08 PM  Clinical Narrative:   TOC unable to start auth for [facility] due to the lost cochlear implant and no PT notes.    Expected Discharge Plan: Skilled Nursing Facility Barriers to Discharge: Barriers Resolved  Expected Discharge Plan and Services       Living arrangements for the past 2 months: Single Family Home Expected Discharge Date: 07/22/23                                     Social Determinants of Health (SDOH) Interventions SDOH Screenings   Food Insecurity: Patient Unable To Answer (07/01/2023)  Housing: Patient Unable To Answer (07/10/2023)  Transportation Needs: No Transportation Needs (07/01/2023)  Utilities: Not At Risk (07/01/2023)  Financial Resource Strain: Patient Declined (01/04/2023)   Received from [facility]  Social Connections: Patient Unable To Answer (07/01/2023)  Tobacco Use: Low Risk  (06/30/2023)    Readmission Risk Interventions     No data to display

## 2023-08-02 DIAGNOSIS — G20B2 Parkinson's disease with dyskinesia, with fluctuations: Secondary | ICD-10-CM | POA: Diagnosis not present

## 2023-08-02 DIAGNOSIS — G9341 Metabolic encephalopathy: Secondary | ICD-10-CM | POA: Diagnosis not present

## 2023-08-02 NOTE — Evaluation (Signed)
 Clinical/Bedside Swallow Evaluation Patient Details  Name: Justin Bird MRN: [id number] Date of Birth: 05/16/1941  Today's Date: 08/02/2023 Time: SLP Start Time (ACUTE ONLY): 1145 SLP Stop Time (ACUTE ONLY): 1255 SLP Time Calculation (min) (ACUTE ONLY): 70 min  Past Medical History:  Past Medical History:  Diagnosis Date   Allergy    Hearing deficit    Parkinson's disease (HCC)    Past Surgical History:  Past Surgical History:  Procedure Laterality Date   COCHLEAR IMPLANT Left    PARTIAL COLECTOMY     HPI:  Pt is a 83 y.o. male with medical history significant of hard of hearing, depression w/ cochlear implant, Parkinson's disease (unknown Cognitive status?), who presents with altered mental status.  Per patient's caregiver, Justin Bird, patient had been doing well until family abruptly discontinued his Sinemet  for unclear reasons, and he subsequently developed confusion/altered mental status for which he presented to ED.  In the ED, MDs assessed pt d/t his increased Agitation indicating concern of diagnosis to include early stage of Dementia, delirium, vitamin B12 deficiency and abnormal thyroid function.  Patient has possibly missed some dose of Sinemet , which may have contributed partially due to withdrawal.   Patient received IV Ativan , IV Haldol , IM Zyprexa  in ED.SABRA   Head CT at admit: No evidence of acute intracranial abnormality.  2. Chronic small vessel ischemic disease.  During this lengthy admit, pt has required Medications for Agitation: Psychiatric medications: Seroquel , Trazodone , Depakote , Lexapro , Ativan .  Per NSG report, pt has been too lethargic to eat his meals in recent days.    Assessment / Plan / Recommendation  Clinical Impression   Pt seen for BSE today -- pt has been admitted to this hospital for 34 days as of today. Pt was awake, responsive to his Caregiver who was feeding him. Pt did not answer questions re: self; did not give name nor follow commands. Pt  w/ garbled speech at times, then clear speech at short phrase level - that's not good referring to the spaghetti. He smiled and opened mouth for the juice and applesauce fed to him. He did not attempt to self-feed. Unsure of his Baseline Cognitive status -- suspect decline. Noted RUE drawn up to his chest. Pt has a Caregiver who assisted him at home; also during this admit. On RA, afebrile. WBC wnl last.  Pt appears to present w/ oropharyngeal phase dysphagia in setting of declined Cognitive status; Baseline Parkinson's Disease and was not taking his medication prior to admit. MD suspects Cognitive decline Baseline per chart notes. ANY Cognitive decline can impact overall awareness/timing of swallow and safety during po tasks which increases risk for aspiration, choking.  Pt appears at increased risk for aspiration/aspiration pneumonia a this time. This risk can be reduced when following general aspiration precautions, giving FULL feeding support at meals, giving po's only when pt is Calm, and using a modified diet consistency of broken down foods. He requires MOD+ verbal/visual/tactile cues for follow through during po tasks.        Pt consumed several trials of purees, Cut soft solids, and thin liquids via straw w/ No consistent, overt clinical s/s of aspiration noted -- mild cough noted x2 w/ thin liquids. Overall, no increased frequency of coughing w/ po's/liquids, no decline in vocal quality, no decline in respiratory status during/post trials. Caregiver stated pt was much better today than in recent days when lethargic. Oral phase was c/b lengthy bolus management and oral clearing of the boluses given; munching mastication of  softened solids. Oral prep deficits noted; MOD cues to engage pt's awareness for bolus acceptance. Pt was Not able to take part in self-feeding. OM Exam was cursory assessing bolus management strength/ROM: generalized oral weakness noted. MOD+ confusion w/ attempted oral care  and oral assessment.          In setting of baseline Cognitive decline/Parkinson's Dis and risk for aspiration, recommend initiation of the dysphagia level 2(MINCED foods moistened for ease of oral phase/mastication) w/ thin liquids(for now) w/ ongoing assessment; aspiration precautions. Reduce Distractions during meals and engage pt during meals for self-feeding. Small bites and ensure oral clearing b/t boluses. Pt must be calm and fully awake for any oral intake. Full Supervision at meals.  Pills Crushed in Puree for safer swallowing as needed.    ST services will monitor pt's status while admitted; this may be a beneficial diet consistency for oral intake at Discharge. Recommends follow w/ Palliative Care for GOC and education re: impact of Cognitive decline/progressive neurological disease on swallowing. Dietician f/u. ST services may be beneficial at next venue of care for support/education w/ Family/Staff. Precautions posted in room, chart. MD/NSG updated.  SLP Visit Diagnosis: Dysphagia, oropharyngeal phase (R13.12) (in setting of Cognitive decline; illness; sedating Medications)    Aspiration Risk  Mild aspiration risk;Risk for inadequate nutrition/hydration    Diet Recommendation   Thin;Dysphagia 2 (chopped) (gravies) = Dysphagia level 2(MINCED foods moistened for ease of oral phase/mastication) w/ thin liquids(for now) w/ ongoing assessment; aspiration precautions. Reduce Distractions during meals and engage pt during meals for self-feeding. Small bites and ensure oral clearing b/t boluses. Pt must be calm and fully awake for any oral intake. Full Supervision at meals.  Medication Administration: Crushed with puree    Other  Recommendations Recommended Consults:  (Dietician; Palliative Care for GOC) Oral Care Recommendations: Oral care BID;Oral care before and after PO;Staff/trained caregiver to provide oral care    Recommendations for follow up therapy are one component of a  multi-disciplinary discharge planning process, led by the attending physician.  Recommendations may be updated based on patient status, additional functional criteria and insurance authorization.  Follow up Recommendations Follow physician's recommendations for discharge plan and follow up therapies (at next venue of care)      Assistance Recommended at Discharge  FULL  Functional Status Assessment Patient has had a recent decline in their functional status and/or demonstrates limited ability to make significant improvements in function in a reasonable and predictable amount of time  Frequency and Duration min 2x/week  2 weeks       Prognosis Prognosis for improved oropharyngeal function: Fair Barriers to Reach Goals: Cognitive deficits;Language deficits;Time post onset;Severity of deficits;Behavior;Medication Barriers/Prognosis Comment: Parkinson's Dis; Cognitive decline; Medications      Swallow Study   General Date of Onset: 07/30/23 HPI: Pt is a 83 y.o. male with medical history significant of hard of hearing, depression w/ cochlear implant, Parkinson's disease (unknown Cognitive status?), who presents with altered mental status.  Per patient's caregiver, Justin Bird, patient had been doing well until family abruptly discontinued his Sinemet  for unclear reasons, and he subsequently developed confusion/altered mental status for which he presented to ED.  In the ED, MDs assessed pt d/t his increased Agitation indicating concern of diagnosis to include early stage of Dementia, delirium, vitamin B12 deficiency and abnormal thyroid function.  Patient has possibly missed some dose of Sinemet , which may have contributed partially due to withdrawal.   Patient received IV Ativan , IV Haldol , IM Zyprexa  in  ED..   Head CT at admit: No evidence of acute intracranial abnormality.  2. Chronic small vessel ischemic disease.  During this lengthy admit, pt has required Medications for Agitation:  Psychiatric medications: Seroquel , Trazodone , Depakote , Lexapro , Ativan .  Per NSG report, pt has been too lethargic to eat his meals in recent days. Type of Study: Bedside Swallow Evaluation Previous Swallow Assessment: none Diet Prior to this Study: Regular;Thin liquids (Level 0) Temperature Spikes Noted: No (wbc wnl) Respiratory Status: Room air History of Recent Intubation: No Behavior/Cognition: Alert;Confused;Distractible;Requires cueing;Doesn't follow directions (Labile x2) Oral Cavity Assessment: Within Functional Limits Oral Care Completed by SLP: Recent completion by staff Oral Cavity - Dentition: Adequate natural dentition (appeared) Vision:  (n/a) Self-Feeding Abilities: Total assist Patient Positioning: Upright in bed (fully supported) Baseline Vocal Quality: Low vocal intensity (muttered/mumbled speech when he spoke intermittently) Volitional Cough: Cognitively unable to elicit Volitional Swallow: Unable to elicit    Oral/Motor/Sensory Function Overall Oral Motor/Sensory Function: Generalized oral weakness (no overt unilateral weakness - but difficult to assess. Munching for mastication.)   Ice Chips Ice chips: Not tested   Thin Liquid Thin Liquid: Impaired (x2) Presentation: Straw (fed; 10+ trials) Oral Phase Impairments: Poor awareness of bolus (cues given) Oral Phase Functional Implications:  (wfl) Pharyngeal  Phase Impairments: Suspected delayed Swallow;Cough - Immediate (x2) Other Comments: oral prep issues intermittently    Nectar Thick Nectar Thick Liquid: Not tested   Honey Thick Honey Thick Liquid: Not tested   Puree Puree: Impaired Presentation: Spoon (fed; 10 trials) Oral Phase Impairments: Poor awareness of bolus Oral Phase Functional Implications: Prolonged oral transit (min) Pharyngeal Phase Impairments:  (none) Other Comments: oral prep issues   Solid     Solid: Impaired Presentation: Spoon (fed; 6 trials) Oral Phase Impairments: Impaired  mastication;Poor awareness of bolus Oral Phase Functional Implications: Impaired mastication;Prolonged oral transit (expectorated x2) Pharyngeal Phase Impairments:  (none) Other Comments: oral prep issues        Comer Portugal, MS, CCC-SLP Speech Language Pathologist Rehab Services; [facility] -  [phone] (ascom) [place] Peifer 08/02/2023,6:11 PM

## 2023-08-02 NOTE — Progress Notes (Signed)
 Physical Therapy Treatment Patient Details Name: Markice Torbert MRN: [id number] DOB: [birth year] Today's Date: 08/02/2023   History of Present Illness Pt is an 83 y/o M admitted on 06/29/23 after presenting with AMS; admitted for management of acute encephalopathy, unknown etiology. Head CT negative for acute issues (unable to obtain MRI 2/2 cochlear implant). PMH: HOH with cochlear implant, depression, Parkinson's disease    PT Comments  Caregiver, Redell, present at bedside throughout session; does exceptionally well with patient and very helpful to therapist.  Receptive to all education and eager to integrate/assist patient as able.  Transitions towards edge of bed with total assist this date.  Initially, somewhat resistive to facilitated movement, but does calm with accommodation to position change.  Maintains unsupported sitting balance with min assist +1-2 this date; improved trunk control, improved midline orientation this date.  Does attempt participation with very short-range reaching with L UE (patient L-hand dominant), but fatigues quickly with progressive activities.  Continues with persistent R-sided weakness, tone to R elbow flex/ext, clonus to R > L ankles, inattention to R; very limited/no visual tracking/scanning and cervical rotation towards midline/R.  Anticipate need for hoyer lift for any/all OOB transfers; may consider trial of hoyer to recliner next session.    If plan is discharge home, recommend the following: Two people to help with walking and/or transfers;Two people to help with bathing/dressing/bathroom;Assistance with cooking/housework;Assistance with feeding;Direct supervision/assist for medications management;Direct supervision/assist for financial management;Assist for transportation;Help with stairs or ramp for entrance;Supervision due to cognitive status   Can travel by private vehicle     No  Equipment Recommendations       Recommendations for Other  Services       Precautions / Restrictions Precautions Precautions: Fall Precaution Comments: L cochlear implant, R hearing aide, decreased vision (cochlear not available during this session) Restrictions Weight Bearing Restrictions Per Provider Order: No     Mobility  Bed Mobility Overal bed mobility: Needs Assistance Bed Mobility: Supine to Sit, Sit to Supine Rolling: Total assist   Supine to sit: Total assist     General bed mobility comments: intermittently resists faciltiated movement, but does relax with accommodation to position change    Transfers                   General transfer comment: unsafe/unable; anticipate need for hoyer lift for any/all OOB attempts    Ambulation/Gait               General Gait Details: unsafe/unable   Stairs             Wheelchair Mobility     Tilt Bed    Modified Rankin (Stroke Patients Only)       Balance Overall balance assessment: Needs assistance Sitting-balance support: No upper extremity supported, Feet supported Sitting balance-Leahy Scale: Fair Sitting balance - Comments: Maintains sitting edge of bed x5-6 minutes with cga/min assist; improved midline and improved postural control compared to previous sessions                                    Cognition Arousal: Alert Behavior During Therapy: Flat affect, Restless                                   General Comments: Patient unable to verbally communicate in meaningful or intelligible way  Exercises Other Exercises Other Exercises: Attempted incorporation of visual tracking/scanning activities, dynamic grasp/release and reaching as able.  Patient does visually locate caregiver when in immediate field of vision; does not track or follow.  Demonstrates no meaningful efforts/ability to initiate R cervical rotation or visual/physical attention to R.  Did actively initiate L UE gross grasp/release on command  x2, requirign 10-15 seconds for processing and initiation of verbal/gestured task. Other Exercises: Does tend to maintain R UE in flexed positioning in unsupported sitting; resistant to act asst/passive ROM.  Does relax with return to supine positioning to allow for R UE ROM.  1+ to 2/3 tone noted in R elbow flex/ext with ROM activities. Other Exercises: Educated caregiver in positioning to patients R side to encourage R visual scanning/awareness; caregiver, Redell, voiced understanding and appreciation of education.    General Comments        Pertinent Vitals/Pain Pain Assessment Pain Assessment: PAINAD Breathing: normal Negative Vocalization: occasional moan/groan, low speech, negative/disapproving quality Facial Expression: facial grimacing Body Language: tense, distressed pacing, fidgeting Consolability: no need to console PAINAD Score: 4 Pain Location: LEs with stretching/movement Pain Descriptors / Indicators: Grimacing Pain Intervention(s): Limited activity within patient's tolerance, Monitored during session, Repositioned    Home Living                          Prior Function            PT Goals (current goals can now be found in the care plan section) Acute Rehab PT Goals PT Goal Formulation: Patient unable to participate in goal setting Time For Goal Achievement: 08/10/23 Potential to Achieve Goals: Fair Progress towards PT goals: Progressing toward goals    Frequency    Min 1X/week      PT Plan      Co-evaluation              AM-PAC PT 6 Clicks Mobility   Outcome Measure  Help needed turning from your back to your side while in a flat bed without using bedrails?: Total Help needed moving from lying on your back to sitting on the side of a flat bed without using bedrails?: Total Help needed moving to and from a bed to a chair (including a wheelchair)?: Total Help needed standing up from a chair using your arms (e.g., wheelchair or  bedside chair)?: Total Help needed to walk in hospital room?: Total Help needed climbing 3-5 steps with a railing? : Total 6 Click Score: 6    End of Session   Activity Tolerance: Patient tolerated treatment well Patient left: in bed;with call bell/phone within reach;with bed alarm set (caregiver, Redell, at bedside) Nurse Communication: Mobility status PT Visit Diagnosis: Muscle weakness (generalized) (M62.81);Other abnormalities of gait and mobility (R26.89);Difficulty in walking, not elsewhere classified (R26.2)     Time: 8886-8857 PT Time Calculation (min) (ACUTE ONLY): 29 min  Charges:    $Therapeutic Activity: 23-37 mins PT General Charges $$ ACUTE PT VISIT: 1 Visit                     Aldrich Lloyd H. Delores, PT, DPT, NCS 08/02/23, 12:26 PM [phone]

## 2023-08-02 NOTE — Progress Notes (Signed)
 PROGRESS NOTE Justin Bird  FMW:[id number]  DOB: January 27, 1941  DOA: 06/29/2023 PCP: Justin Lenis, MD  Hospital course: Justin Bird is a 83 y.o. male with medical history significant of hard of hearing, depression, Parkinson's disease, who presents with altered mental status.  Per patient's caregiver Justin Bird, patient had been doing well until family abruptly discontinued his Sinemet  for unclear reasons, and he subsequently developed confusion/altered mental status for which he presented to ED.   01/02: admitted to hospitalist service w/ AMS, neurology consult. Of note, cannot do MRI due to presence of cochlear implant.  01/04: initial psych consult - they have been intermittently following  01/05-01/27: placement pending and approved to go 1/24 but was agitated and EMS unable to take him that evening.  He was here through the weekend, SNF was unable to take him 1/27. 1/28-2/5: Patient has been calm past several days.Significant hard of hearing, cochlear implant installed properly by prior caregiver on 1/31. Unfortunately, the implant was lost overnight and has not had in place since 2/3. This is seriously interfering with ability to work with him. PT reevaluated 2/5. He has been improving with his home aid with him at bedside.  He has received acceptance to [facility] where his wife resides. We are coordinating transporting him there tomorrow.  His medications have been stable for several days and does not have any Prns for agitation. He remains sedated but not making further titrations at this time per facility requirements and would recommend a slow wean.   Subjective: Patient is alert with eyes open. Does not communicate meaningfully. Able to eat. Follows some commands with his aid, Justin Bird.   Objective: Blood pressure 105/68, pulse 93, temperature 98.9 F (37.2 C), resp. rate 18, height 5' 5 (1.651 m), weight 71.7 kg, SpO2 92%.  Exam: General: Patient lying  comfortably in bed at 20 degrees in NAD. Cochlear implant is not in place. R hearing aid in.  CVS: Distant heart sounds, regular Respiratory:  CTAB. GI: soft, not tender LE: Warm and well-perfused Neuro: Moving all of his extremities equally.   Data Reviewed:  Basic Metabolic Panel: Recent Labs  Lab 07/31/23 0540  NA 145  K 3.8  CL 105  CO2 29  GLUCOSE 98  BUN 27*  CREATININE 0.72  CALCIUM 9.1    CBC: Recent Labs  Lab 07/31/23 0540  WBC 7.7  HGB 16.0  HCT 49.9  MCV 91.9  PLT 281   Assessment & Plan:   Parkinson's  Acute metabolic encephalopathy Disposition Etiology of encephalopathy is not clear, most likely secondary to advanced dementia/Parkinson's dementia/vascular dementia and withdrawal from Sinemet  which has now been restarted. Metabolic and infectious workups have been negative. - continue medications as recommended by psychiatry consult and gradually decrease seroquel  to reduce sedation. - now 100mg  TID - Continue trazodone , Depakote , Lexapro , psychiatry has signed off - ativan  is scheduled for anxiety, agitation.  - TOC following for placement with SNF. Planning to dc to SNF when bed available. He has not needed PRN agitation medications >96 hours - recommend transporting timed for after he has received his anxiety medication when that comes. Have his daughter and caregiver present for transportation, have his wife at facility upon arrival, ensure his cochlear implant in place, and move him very gradually to avoid agitation/anxiety with transportation as happened on previous attempt.   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 There is report that patient was wearing the implant overnight and now is not present and staff unable to find it. Sounds like it was likely put in the laundry with the linens  after being changed, unintentionally.    Vitamin B12 deficiency- borderline low 319 Continue vitamin B12 replacement   Parkinson's disease  Continue Sinemet  3 times daily home dose - PT/OT   Acute urinary retention- foley Placed on 1/8 for urinary retention.  No known history of BPH.  Flomax was considered however patient has severe allergic reaction to sulfa medications including anaphylaxis. Foley removed and incontinent  Continue to monitor urine output closely   DVT prophylaxis: Lovenox  Code Status: Full Family Communication: caregiver at bedside. Spoke with son and daughter in law on phone  Studies: No results found.  Principal Problem:   Acute metabolic encephalopathy Active Problems:   Parkinson's disease (HCC)   Justin Bird Justin Bird, [facility]  If 7PM-7AM, please contact night-coverage [email or web address]   LOS: 33 days

## 2023-08-02 NOTE — TOC Progression Note (Signed)
 Transition of Care [facility]) - Progression Note    Patient Details  Name: Justin Bird MRN: [id number] Date of Birth: [birth year]  Transition of Care [facility]) CM/SW Contact  Ladene Lady, LCSW Phone Number: 08/02/2023, 12:49 PM  Clinical Narrative:     Shara started for [facility]. LC notified.  Expected Discharge Plan: Skilled Nursing Facility Barriers to Discharge: Barriers Resolved  Expected Discharge Plan and Services       Living arrangements for the past 2 months: Single Family Home Expected Discharge Date: 07/22/23                                     Social Determinants of Health (SDOH) Interventions SDOH Screenings   Food Insecurity: Patient Unable To Answer (07/01/2023)  Housing: Patient Unable To Answer (07/10/2023)  Transportation Needs: No Transportation Needs (07/01/2023)  Utilities: Not At Risk (07/01/2023)  Financial Resource Strain: Patient Declined (01/04/2023)   Received from [facility]  Social Connections: Patient Unable To Answer (07/01/2023)  Tobacco Use: Low Risk  (06/30/2023)    Readmission Risk Interventions     No data to display

## 2023-08-02 NOTE — TOC Progression Note (Signed)
 Transition of Care [facility]) - Progression Note    Patient Details  Name: Justin Bird MRN: [id number] Date of Birth: 04-29-1941  [facility] CM/SW Contact  Ladene Lady, LCSW Phone Number: 08/02/2023, 1:25 PM  Clinical Narrative:   Shara approved for 2/6-2/10.    Expected Discharge Plan: Skilled Nursing Facility Barriers to Discharge: Barriers Resolved  Expected Discharge Plan and Services       Living arrangements for the past 2 months: Single Family Home Expected Discharge Date: 07/22/23                                     Social Determinants of Health (SDOH) Interventions SDOH Screenings   Food Insecurity: Patient Unable To Answer (07/01/2023)  Housing: Patient Unable To Answer (07/10/2023)  Transportation Needs: No Transportation Needs (07/01/2023)  Utilities: Not At Risk (07/01/2023)  Financial Resource Strain: Patient Declined (01/04/2023)   Received from [facility]  Social Connections: Patient Unable To Answer (07/01/2023)  Tobacco Use: Low Risk  (06/30/2023)    Readmission Risk Interventions     No data to display

## 2023-08-02 NOTE — Plan of Care (Signed)

## 2023-08-03 DIAGNOSIS — G20B2 Parkinson's disease with dyskinesia, with fluctuations: Secondary | ICD-10-CM | POA: Diagnosis not present

## 2023-08-03 DIAGNOSIS — G9341 Metabolic encephalopathy: Secondary | ICD-10-CM | POA: Diagnosis not present

## 2023-08-03 MED ORDER — QUETIAPINE FUMARATE 100 MG PO TABS: 100.0000 mg | ORAL_TABLET | Freq: Three times a day (TID) | ORAL | Status: DC

## 2023-08-03 MED ORDER — TRAZODONE HCL 150 MG PO TABS: 150.0000 mg | ORAL_TABLET | Freq: Every day | ORAL | Status: DC

## 2023-08-03 MED ORDER — LORAZEPAM 1 MG PO TABS: 1.0000 mg | ORAL_TABLET | Freq: Three times a day (TID) | ORAL | 0 refills | Status: AC

## 2023-08-03 NOTE — Discharge Summary (Signed)
 Physician Discharge Summary  Patient: Justin Bird FMW:[id number] DOB: 08-31-1940   Code Status: Full Code Admit date: 06/29/2023 Discharge date: 08/03/2023 Disposition: Skilled nursing facility, PT, OT, SLP, nurse aid, RN, child psychotherapist, and wound care PCP: [name], MD  Recommendations for Outpatient Follow-up:  Follow up with PCP within 1-2 weeks Regarding general hospital follow up and preventative care Recommend gradual, slow taper of sedating medications  Follow up with cochlear implant replacement if not found from hospital stay  Discharge Diagnoses:  Principal Problem:   Acute metabolic encephalopathy Active Problems:   Parkinson's disease [facility])  Brief Hospital Course Summary: Justin Bird is a 83 y.o. male with medical history significant of hard of hearing, depression, Parkinson's disease, who presents with altered mental status.  Per patient's caregiver [name], patient had been doing well until family abruptly discontinued his Sinemet  for unclear reasons, and he subsequently developed confusion/altered mental status for which he presented to ED.   01/02: admitted to hospitalist service w/ AMS, neurology consult. Of note, cannot do MRI due to presence of cochlear implant.  01/04: initial psych consult - they have been intermittently following  01/05-01/24: placement pending and approved to go 1/24 but was agitated when EMS attempted to take him so transportation was cancelled. Facility would not take due to the agitation. Since that time, patient remains calm and sedated for the most part without additional agitation at rest. His medications have been titrated down to reduce his sedation and has periods of alertness but no aggression. He remains unable to communicate consistently to staff but has a familiar guardian, Dorise who has been bedside daily and helps with his care. He seems to respond well to the familiar person.  His medications have remained  stable for several days and not needed further PRN medications for aggression or agitation so they were discontinued for several days.  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 Staff continues to follow up with the company who washes linens to see if it was put into the wash.  He is accepted to the living facility where his wife resides which I think will be a huge benefit to him if he can tolerate the transport to get there today. When he is more settled into a familiar environment, I recommend slowly weaning his sedating medications and continuing to work with PT/OT/SLP and use hearing aids continuously.    Discharge Condition: Stable, improved Recommended discharge diet: Regular healthy diet  Consultations: Psych  Palliative care  Procedures/Studies: None   Discharge Instructions     Diet general   Complete by: As directed    Increase activity slowly   Complete by: As directed       Allergies as of 08/03/2023       Reactions   Opium Poppy [papaver] Anaphylaxis, Hives   Penicillins Anaphylaxis, Rash   Sulfa Antibiotics Anaphylaxis   Penicillins    Sulfa Antibiotics         Medication List     STOP taking these medications    cetirizine 10 MG tablet Commonly known as: ZYRTEC   gabapentin  100 MG capsule Commonly known as: NEURONTIN    Vitamin D -3 25 MCG (1000 UT) Caps       TAKE these medications    acetaminophen  325 MG tablet Commonly known as: TYLENOL  Take 2 tablets (650 mg total) by mouth every 6 (six)  hours as needed for mild pain (pain score 1-3) or fever.   bisacodyl  5 MG EC tablet Commonly known as: DULCOLAX Take 2 tablets (10 mg total) by mouth at bedtime.   bisacodyl  10 MG suppository Commonly known as: DULCOLAX Place 1 suppository (10 mg total) rectally daily as needed for severe constipation.   carbidopa -levodopa  25-100 MG  tablet Commonly known as: SINEMET  IR Take 2 tablets by mouth 3 (three) times daily.   cyanocobalamin  1000 MCG tablet Commonly known as: VITAMIN B12 Take 1,000 mcg by mouth daily.   doxazosin  1 MG tablet Commonly known as: CARDURA  Take 1 tablet (1 mg total) by mouth daily.   escitalopram  5 MG tablet Commonly known as: LEXAPRO  Take 5 mg by mouth daily.   latanoprost  0.005 % ophthalmic solution Commonly known as: XALATAN  Place 1 drop into both eyes at bedtime.   LORazepam  1 MG tablet Commonly known as: ATIVAN  Take 1 tablet (1 mg total) by mouth every 8 (eight) hours for 5 days.   polyethylene glycol 17 g packet Commonly known as: MIRALAX  / GLYCOLAX  Take 17 g by mouth 2 (two) times daily.   polyvinyl alcohol  1.4 % ophthalmic solution Commonly known as: LIQUIFILM TEARS Place 2 drops into both eyes as needed for dry eyes.   QUEtiapine  100 MG tablet Commonly known as: SEROQUEL  Take 1 tablet (100 mg total) by mouth 3 (three) times daily.   traZODone  150 MG tablet Commonly known as: DESYREL  Take 1 tablet (150 mg total) by mouth at bedtime.        Follow-up Information     [name], MD Follow up.   Specialty: Family Medicine Why: Hospital follow up Contact information: [address] [place] [address] [phone]                 Subjective   Pt reports nothing. He responds to tactile stimuli but does not open his eyes or speak to me today.   All questions and concerns were addressed at time of discharge.  Objective  Blood pressure (!) 143/95, pulse 88, temperature 97.6 F (36.4 C), resp. rate 15, height 5' 5 (1.651 m), weight 71.7 kg, SpO2 97%.   General: Pt is alert, awake, not in acute distress. Sitting in bed with eyes closed Cardiovascular: RRR, S1/S2 +, no rubs, no gallops Respiratory: CTA bilaterally, no wheezing, no rhonchi Abdominal: Soft, NT, ND, bowel sounds + Extremities: no edema, no cyanosis  The results of significant  diagnostics from this hospitalization (including imaging, microbiology, ancillary and laboratory) are listed below for reference.   Imaging studies: No results found.  Labs: Basic Metabolic Panel: Recent Labs  Lab 07/31/23 0540  NA 145  K 3.8  CL 105  CO2 29  GLUCOSE 98  BUN 27*  CREATININE 0.72  CALCIUM 9.1   CBC: Recent Labs  Lab 07/31/23 0540  WBC 7.7  HGB 16.0  HCT 49.9  MCV 91.9  PLT 281   Microbiology: Results for orders placed or performed during the hospital encounter of 06/29/23  SARS Coronavirus 2 by RT PCR (hospital order, performed in [facility]) *cepheid single result test* Anterior Nasal Swab     Status: None   Collection Time: 06/30/23  9:06 AM   Specimen: Anterior Nasal Swab  Result Value Ref Range Status   SARS Coronavirus 2 by RT PCR NEGATIVE NEGATIVE Final    Comment: (NOTE) SARS-CoV-2 target nucleic acids are NOT DETECTED.  The SARS-CoV-2 RNA is generally detectable in  upper and lower respiratory specimens during the acute phase of infection. The lowest concentration of SARS-CoV-2 viral copies this assay can detect is 250 copies / mL. A negative result does not preclude SARS-CoV-2 infection and should not be used as the sole basis for treatment or other patient management decisions.  A negative result may occur with improper specimen collection / handling, submission of specimen other than nasopharyngeal swab, presence of viral mutation(s) within the areas targeted by this assay, and inadequate number of viral copies (<250 copies / mL). A negative result must be combined with clinical observations, patient history, and epidemiological information.  Fact Sheet for Patients:   roadlaptop.co.za  Fact Sheet for Healthcare Providers: http://kim-miller.com/  This test is not yet approved or  cleared by the [place]  FDA and has been authorized for detection and/or diagnosis of  SARS-CoV-2 by FDA under an Emergency Use Authorization (EUA).  This EUA will remain in effect (meaning this test can be used) for the duration of the COVID-19 declaration under Section 564(b)(1) of the Act, 21 U.S.C. section 360bbb-3(b)(1), unless the authorization is terminated or revoked sooner.  Performed at [facility], [address] [place], [place] [address]     Time coordinating discharge: Over 30 minutes  Marien LITTIE Piety, MD  [facility] 08/03/2023, 9:20 AM

## 2023-08-03 NOTE — Progress Notes (Addendum)
 Pt sent via EMS to [facility] room 303A. I attempted to call report @ [phone] twice but I was disconnect after being on hold for 5 minutes.  Pts care giver was at bedside and accompanied him to [facility]. All pts belongings sent with him as well. Pt remained calm.  [facility] nurse Orland called back asking for report. I gave her a full report at 1255.

## 2023-08-03 NOTE — Plan of Care (Signed)
  Problem: Education: Goal: Knowledge of General Education information will improve Description: Including pain rating scale, medication(s)/side effects and non-pharmacologic comfort measures Outcome: Not Progressing   Problem: Health Behavior/Discharge Planning: Goal: Ability to manage health-related needs will improve Outcome: Not Progressing   Problem: Clinical Measurements: Goal: Ability to maintain clinical measurements within normal limits will improve Outcome: Progressing Goal: Will remain free from infection Outcome: Progressing Goal: Diagnostic test results will improve Outcome: Progressing Goal: Respiratory complications will improve Outcome: Progressing Goal: Cardiovascular complication will be avoided Outcome: Progressing   Problem: Activity: Goal: Risk for activity intolerance will decrease Outcome: Progressing   Problem: Nutrition: Goal: Adequate nutrition will be maintained Outcome: Progressing   Problem: Coping: Goal: Level of anxiety will decrease Outcome: Progressing   Problem: Elimination: Goal: Will not experience complications related to bowel motility Outcome: Progressing Goal: Will not experience complications related to urinary retention Outcome: Progressing   Problem: Pain Managment: Goal: General experience of comfort will improve and/or be controlled Outcome: Progressing   Problem: Safety: Goal: Ability to remain free from injury will improve Outcome: Progressing   Problem: Skin Integrity: Goal: Risk for impaired skin integrity will decrease Outcome: Progressing

## 2023-08-03 NOTE — TOC Transition Note (Signed)
 Transition of Care [facility]) - Discharge Note   Patient Details  Name: Justin Bird MRN: [id number] Date of Birth: 01-17-1941  Transition of Care [facility]) CM/SW Contact:  Ladene Lady, LCSW Phone Number: 08/03/2023, 10:16 AM   Clinical Narrative:   Pt to discharge today to [facility]. RN given number for report. Son notified. Aide to be at bedside at time of transport. DC summay sent.    Final next level of care: Skilled Nursing Facility Barriers to Discharge: Barriers Resolved   Patient Goals and CMS Choice Patient states their goals for this hospitalization and ongoing recovery are:: go to [facility] CMS Medicare.gov Compare Post Acute Care list provided to:: Patient Represenative (must comment) (Son ryan) Choice offered to / list presented to : Adult Children      Discharge Placement PASRR number recieved: 07/05/23            Patient chooses bed at: [facility] Patient to be transferred to facility by: [facility] Name of family member notified: ryan Patient and family notified of of transfer: 08/03/23  Discharge Plan and Services Additional resources added to the After Visit Summary for                                       Social Drivers of Health (SDOH) Interventions SDOH Screenings   Food Insecurity: Patient Unable To Answer (07/01/2023)  Housing: Patient Unable To Answer (07/10/2023)  Transportation Needs: No Transportation Needs (07/01/2023)  Utilities: Not At Risk (07/01/2023)  Financial Resource Strain: Patient Declined (01/04/2023)   Received from [facility]  Social Connections: Patient Unable To Answer (07/01/2023)  Tobacco Use: Low Risk  (06/30/2023)     Readmission Risk Interventions     No data to display

## 2023-08-12 ENCOUNTER — Emergency Department: Payer: Medicare Other

## 2023-08-12 ENCOUNTER — Inpatient Hospital Stay
Admission: EM | Admit: 2023-08-12 | Discharge: 2023-08-25 | DRG: 871 | Disposition: A | Discharge status: Skilled Nursing Facility | Payer: Medicare Other | Source: Skilled Nursing Facility | Attending: Student | Admitting: Student

## 2023-08-12 ENCOUNTER — Other Ambulatory Visit: Payer: Self-pay

## 2023-08-12 DIAGNOSIS — H903 Sensorineural hearing loss, bilateral: Secondary | ICD-10-CM | POA: Diagnosis present

## 2023-08-12 DIAGNOSIS — H109 Unspecified conjunctivitis: Secondary | ICD-10-CM | POA: Diagnosis present

## 2023-08-12 DIAGNOSIS — I959 Hypotension, unspecified: Secondary | ICD-10-CM | POA: Diagnosis present

## 2023-08-12 DIAGNOSIS — Z8673 Personal history of transient ischemic attack (TIA), and cerebral infarction without residual deficits: Secondary | ICD-10-CM

## 2023-08-12 DIAGNOSIS — E440 Moderate protein-calorie malnutrition: Secondary | ICD-10-CM | POA: Insufficient documentation

## 2023-08-12 DIAGNOSIS — G20A1 Parkinson's disease without dyskinesia, without mention of fluctuations: Secondary | ICD-10-CM

## 2023-08-12 DIAGNOSIS — R4701 Aphasia: Secondary | ICD-10-CM | POA: Diagnosis present

## 2023-08-12 DIAGNOSIS — E876 Hypokalemia: Secondary | ICD-10-CM | POA: Diagnosis present

## 2023-08-12 DIAGNOSIS — L89322 Pressure ulcer of left buttock, stage 2: Secondary | ICD-10-CM | POA: Diagnosis present

## 2023-08-12 DIAGNOSIS — E860 Dehydration: Secondary | ICD-10-CM | POA: Diagnosis present

## 2023-08-12 DIAGNOSIS — A419 Sepsis, unspecified organism: Principal | ICD-10-CM

## 2023-08-12 DIAGNOSIS — Z801 Family history of malignant neoplasm of trachea, bronchus and lung: Secondary | ICD-10-CM

## 2023-08-12 DIAGNOSIS — Z1152 Encounter for screening for COVID-19: Secondary | ICD-10-CM

## 2023-08-12 DIAGNOSIS — F0153 Vascular dementia, unspecified severity, with mood disturbance: Secondary | ICD-10-CM | POA: Diagnosis present

## 2023-08-12 DIAGNOSIS — L89312 Pressure ulcer of right buttock, stage 2: Secondary | ICD-10-CM | POA: Diagnosis present

## 2023-08-12 DIAGNOSIS — Z882 Allergy status to sulfonamides status: Secondary | ICD-10-CM

## 2023-08-12 DIAGNOSIS — N390 Urinary tract infection, site not specified: Secondary | ICD-10-CM | POA: Diagnosis not present

## 2023-08-12 DIAGNOSIS — G9341 Metabolic encephalopathy: Secondary | ICD-10-CM | POA: Diagnosis present

## 2023-08-12 DIAGNOSIS — Z66 Do not resuscitate: Secondary | ICD-10-CM | POA: Diagnosis present

## 2023-08-12 DIAGNOSIS — E870 Hyperosmolality and hypernatremia: Secondary | ICD-10-CM | POA: Diagnosis present

## 2023-08-12 DIAGNOSIS — J9601 Acute respiratory failure with hypoxia: Secondary | ICD-10-CM | POA: Diagnosis present

## 2023-08-12 DIAGNOSIS — F0283 Dementia in other diseases classified elsewhere, unspecified severity, with mood disturbance: Secondary | ICD-10-CM | POA: Diagnosis present

## 2023-08-12 DIAGNOSIS — J320 Chronic maxillary sinusitis: Secondary | ICD-10-CM | POA: Diagnosis present

## 2023-08-12 DIAGNOSIS — Z880 Allergy status to penicillin: Secondary | ICD-10-CM

## 2023-08-12 DIAGNOSIS — F32A Depression, unspecified: Secondary | ICD-10-CM | POA: Diagnosis present

## 2023-08-12 DIAGNOSIS — E872 Acidosis, unspecified: Secondary | ICD-10-CM | POA: Diagnosis present

## 2023-08-12 DIAGNOSIS — Z9049 Acquired absence of other specified parts of digestive tract: Secondary | ICD-10-CM

## 2023-08-12 DIAGNOSIS — Z6825 Body mass index (BMI) 25.0-25.9, adult: Secondary | ICD-10-CM

## 2023-08-12 DIAGNOSIS — Z79899 Other long term (current) drug therapy: Secondary | ICD-10-CM

## 2023-08-12 LAB — LACTIC ACID, PLASMA
Lactic Acid, Venous: 2.3 mmol/L (ref 0.5–1.9)
Lactic Acid, Venous: 3.3 mmol/L (ref 0.5–1.9)
Lactic Acid, Venous: 2.5 mmol/L (ref 0.5–1.9)

## 2023-08-12 LAB — COMPREHENSIVE METABOLIC PANEL
ALT: 25 U/L (ref 0–44)
AST: 61 U/L — ABNORMAL HIGH (ref 15–41)
Albumin: 3.5 g/dL (ref 3.5–5.0)
Alkaline Phosphatase: 72 U/L (ref 38–126)
Anion gap: 13 (ref 5–15)
BUN: 46 mg/dL — ABNORMAL HIGH (ref 8–23)
CO2: 25 mmol/L (ref 22–32)
Calcium: 8.9 mg/dL (ref 8.9–10.3)
Chloride: 120 mmol/L — ABNORMAL HIGH (ref 98–111)
Creatinine, Ser: 1.11 mg/dL (ref 0.61–1.24)
GFR, Estimated: >60 mL/min (ref >60)
Glucose, Bld: 138 mg/dL — ABNORMAL HIGH (ref 70–99)
Potassium: 4 mmol/L (ref 3.5–5.1)
Sodium: 158 mmol/L — ABNORMAL HIGH (ref 135–145)
Total Bilirubin: 1.8 mg/dL — ABNORMAL HIGH (ref 0.0–1.2)
Total Protein: 6.8 g/dL (ref 6.5–8.1)

## 2023-08-12 LAB — URINALYSIS, W/ REFLEX TO CULTURE (INFECTION SUSPECTED)
Bilirubin Urine: NEGATIVE
Glucose, UA: NEGATIVE mg/dL
Ketones, ur: NEGATIVE mg/dL
Nitrite: NEGATIVE
Protein, ur: 100 mg/dL — AB
Specific Gravity, Urine: 1.03 (ref 1.005–1.030)
WBC, UA: >50 WBC/hpf (ref 0–5)
pH: 5 (ref 5.0–8.0)

## 2023-08-12 LAB — CBC WITH DIFFERENTIAL/PLATELET
Abs Immature Granulocytes: 0.06 10*3/uL (ref 0.00–0.07)
Basophils Absolute: 0.1 10*3/uL (ref 0.0–0.1)
Basophils Relative: 1 %
Eosinophils Absolute: 0 10*3/uL (ref 0.0–0.5)
Eosinophils Relative: 0 %
HCT: 58.1 % — ABNORMAL HIGH (ref 39.0–52.0)
Hemoglobin: 17.6 g/dL — ABNORMAL HIGH (ref 13.0–17.0)
Immature Granulocytes: 0 %
Lymphocytes Relative: 8 %
Lymphs Abs: 1.3 10*3/uL (ref 0.7–4.0)
MCH: 29.1 pg (ref 26.0–34.0)
MCHC: 30.3 g/dL (ref 30.0–36.0)
MCV: 96.2 fL (ref 80.0–100.0)
Monocytes Absolute: 1.2 10*3/uL — ABNORMAL HIGH (ref 0.1–1.0)
Monocytes Relative: 8 %
Neutro Abs: 12.9 10*3/uL — ABNORMAL HIGH (ref 1.7–7.7)
Neutrophils Relative %: 83 %
Platelets: 325 10*3/uL (ref 150–400)
RBC: 6.04 MIL/uL — ABNORMAL HIGH (ref 4.22–5.81)
RDW: 13.8 % (ref 11.5–15.5)
WBC: 15.4 10*3/uL — ABNORMAL HIGH (ref 4.0–10.5)
nRBC: 0 % (ref 0.0–0.2)

## 2023-08-12 LAB — RESP PANEL BY RT-PCR (RSV, FLU A&B, COVID)  RVPGX2
Influenza A by PCR: NEGATIVE
Influenza B by PCR: NEGATIVE
Resp Syncytial Virus by PCR: NEGATIVE
SARS Coronavirus 2 by RT PCR: NEGATIVE

## 2023-08-12 LAB — TROPONIN I (HIGH SENSITIVITY)
Troponin I (High Sensitivity): 20 ng/L — ABNORMAL HIGH (ref <18)
Troponin I (High Sensitivity): 19 ng/L — ABNORMAL HIGH (ref <18)

## 2023-08-12 MED ORDER — VANCOMYCIN HCL IN DEXTROSE 1-5 GM/200ML-% IV SOLN
1000.0000 mg | Freq: Once | INTRAVENOUS | Status: AC
  Administered 2023-08-12: 1000 mg via INTRAVENOUS
  Filled 2023-08-12: qty 200

## 2023-08-12 MED ORDER — LACTATED RINGERS IV BOLUS (SEPSIS)
1000.0000 mL | Freq: Once | INTRAVENOUS | Status: AC
  Administered 2023-08-12: 1000 mL via INTRAVENOUS

## 2023-08-12 MED ORDER — LACTATED RINGERS IV BOLUS (SEPSIS)
250.0000 mL | Freq: Once | INTRAVENOUS | Status: AC
  Administered 2023-08-12: 250 mL via INTRAVENOUS

## 2023-08-12 MED ORDER — METRONIDAZOLE 500 MG/100ML IV SOLN
500.0000 mg | Freq: Once | INTRAVENOUS | Status: AC
  Administered 2023-08-12: 500 mg via INTRAVENOUS
  Filled 2023-08-12: qty 100

## 2023-08-12 MED ORDER — SODIUM CHLORIDE 0.9 % IV SOLN
2.0000 g | Freq: Once | INTRAVENOUS | Status: AC
  Administered 2023-08-12: 2 g via INTRAVENOUS
  Filled 2023-08-12: qty 10

## 2023-08-12 MED ORDER — SODIUM CHLORIDE 0.9 % IV BOLUS
500.0000 mL | Freq: Once | INTRAVENOUS | Status: AC
  Administered 2023-08-12: 500 mL via INTRAVENOUS

## 2023-08-12 NOTE — Consult Note (Signed)
CODE SEPSIS - PHARMACY COMMUNICATION  **Broad Spectrum Antibiotics should be administered within 1 hour of Sepsis diagnosis**  Time Code Sepsis Called/Page Received: 1418  Antibiotics Ordered: vancomycin, aztreonam, metronidazole   Time of 1st antibiotic administration: 1459  Additional action taken by pharmacy: N/A  If necessary, Name of Provider/Nurse Contacted: N/A  Littie Deeds, PharmD Pharmacy Resident  08/12/2023 2:20 PM

## 2023-08-12 NOTE — ED Provider Notes (Signed)
[facility] Provider Note    Event Date/Time   First MD Initiated Contact with Patient 08/12/23 1408     (approximate)   History   Fever   HPI  Dak Szumski is a 83 y.o. male with a history of Parkinson's disease and depression who presents with altered mental status and fever.  Per EMS, the patient has some dementia at baseline but appeared more altered.  He was also noted to be febrile and hypoxic.  The patient himself is currently nonverbal and unable to state any complaints.  I reviewed the past medical records.  The patient was admitted to the hospital service earlier this month with altered mental status after discontinuing Sinemet.   Physical Exam   Triage Vital Signs: ED Triage Vitals  Encounter Vitals Group     BP 08/12/23 1419 (!) 87/45     Systolic BP Percentile --      Diastolic BP Percentile --      Pulse Rate 08/12/23 1419 (!) 116     Resp 08/12/23 1419 17     Temp 08/12/23 1419 (!) 101 F (38.3 C)     Temp Source 08/12/23 1419 Axillary     SpO2 08/12/23 1419 96 %     Weight 08/12/23 1420 150 lb (68 kg)     Height 08/12/23 1420 5\' 5"  (1.651 m)     Head Circumference --      Peak Flow --      Pain Score --      Pain Loc --      Pain Education --      Exclude from Growth Chart --     Most recent vital signs: Vitals:   08/12/23 1419  BP: (!) 87/45  Pulse: (!) 116  Resp: 17  Temp: (!) 101 F (38.3 C)  SpO2: 96%     General: Awake, ill-appearing. CV:  Good peripheral perfusion.  Resp:  Normal effort.  Diminished breath sounds bilaterally with some scattered rhonchi.  No rales or wheezes. Abd:  Soft and nontender.  No distention.  Other:  EOMI.  PERRLA.  Dry mucous membranes.  Unable to assess motor due to the patient's altered mental status.  Moans to painful stimuli.   ED Results / Procedures / Treatments   Labs (all labs ordered are listed, but only abnormal results are displayed) Labs Reviewed  LACTIC ACID,  PLASMA - Abnormal; Notable for the following components:      Result Value   Lactic Acid, Venous 2.3 (*)    All other components within normal limits  COMPREHENSIVE METABOLIC PANEL - Abnormal; Notable for the following components:   Sodium 158 (*)    Chloride 120 (*)    Glucose, Bld 138 (*)    BUN 46 (*)    AST 61 (*)    Total Bilirubin 1.8 (*)    All other components within normal limits  CBC WITH DIFFERENTIAL/PLATELET - Abnormal; Notable for the following components:   WBC 15.4 (*)    RBC 6.04 (*)    Hemoglobin 17.6 (*)    HCT 58.1 (*)    Neutro Abs 12.9 (*)    Monocytes Absolute 1.2 (*)    All other components within normal limits  TROPONIN I (HIGH SENSITIVITY) - Abnormal; Notable for the following components:   Troponin I (High Sensitivity) 20 (*)    All other components within normal limits  RESP PANEL BY RT-PCR (RSV, FLU A&B, COVID)  RVPGX2  CULTURE,  BLOOD (ROUTINE X 2)  CULTURE, BLOOD (ROUTINE X 2)  LACTIC ACID, PLASMA  URINALYSIS, W/ REFLEX TO CULTURE (INFECTION SUSPECTED)  TROPONIN I (HIGH SENSITIVITY)     EKG  ED ECG REPORT I, Dionne Bucy, the attending physician, personally viewed and interpreted this ECG.  Date: 08/12/2023 EKG Time: 1419 Rate: 116 Rhythm: Sinus tachycardia QRS Axis: normal Intervals: normal ST/T Wave abnormalities: normal Narrative Interpretation: no evidence of acute ischemia    RADIOLOGY  Chest x-ray: I independently viewed and interpreted the images; there is no focal consolidation or edema  CT head:  IMPRESSION:  1. 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 This is new or progressed since 06/29/2023.  2. Right frontal, ethmoid, and maxillary sinus disease.    PROCEDURES:  Critical Care performed: Yes, see critical care procedure note(s)  .Critical Care  Performed by: Dionne Bucy, MD Authorized  by: Dionne Bucy, MD   Critical care provider statement:    Critical care time (minutes):  35   Critical care time was exclusive of:  Separately billable procedures and treating other patients   Critical care was necessary to treat or prevent imminent or life-threatening deterioration of the following conditions:  Sepsis   Critical care was time spent personally by me on the following activities:  Development of treatment plan with patient or surrogate, discussions with consultants, evaluation of patient's response to treatment, examination of patient, ordering and review of laboratory studies, ordering and review of radiographic studies, ordering and performing treatments and interventions, pulse oximetry, re-evaluation of patient's condition, review of old charts and obtaining history from patient or surrogate   Care discussed with: admitting provider      MEDICATIONS ORDERED IN ED: Medications  vancomycin (VANCOCIN) IVPB 1000 mg/200 mL premix (1,000 mg Intravenous New Bag/Given 08/12/23 1538)  lactated ringers bolus 1,000 mL (has no administration in time range)    And  lactated ringers bolus 250 mL (has no administration in time range)  lactated ringers bolus 1,000 mL (1,000 mLs Intravenous New Bag/Given 08/12/23 1459)  aztreonam (AZACTAM) 2 g in sodium chloride 0.9 % 100 mL IVPB (2 g Intravenous New Bag/Given 08/12/23 1459)  metroNIDAZOLE (FLAGYL) IVPB 500 mg (500 mg Intravenous New Bag/Given 08/12/23 1504)     IMPRESSION / MDM / ASSESSMENT AND PLAN / ED COURSE  I reviewed the triage vital signs and the nursing notes.  83 year old male with PMH as noted above presents with altered mental status, fever, and hypoxia today.  On arrival to the ED the patient is febrile to 101.  He received IV Tylenol by EMS.  He is tachycardic and hypotensive.  The patient is awake but only moans to painful stimuli.  Neuroexam is limited due to his mental status.  Differential diagnosis includes, but  is not limited to, acute infection/sepsis including pneumonia and UTI, intracranial hemorrhage or other CNS etiology, dehydration, electrolyte abnormality, AKI, or other metabolic etiology.  Although the patient is febrile and altered, there is no specific finding to suggest meningitis or encephalitis.  We will give fluids and empiric antibiotics per the sepsis protocol, obtain lab workup, CT head, chest x-ray, and reassess.  The patient is DNR.  Patient's presentation is most consistent with acute presentation with potential threat to life or bodily function.  The patient is on the cardiac monitor to evaluate for evidence of arrhythmia and/or significant heart rate changes.  ----------------------------------------- 3:41 PM  on 08/12/2023 -----------------------------------------  Lab workup reveals leukocytosis and an elevated lactate.  Respiratory panel is negative.  CT head is negative.  Chest x-ray does not show evidence of pneumonia.  I have ordered additional fluids per the sepsis protocol for 30 mL/kg.  The patient will need admission for further management once his ED workup has resulted.  I have signed him out to the oncoming ED physician Dr. Rosalia Hammers.  FINAL CLINICAL IMPRESSION(S) / ED DIAGNOSES   Final diagnoses:  Sepsis without acute organ dysfunction, due to unspecified organism [facility])     Rx / DC Orders   ED Discharge Orders     None        Note:  This document was prepared using Dragon voice recognition software and may include unintentional dictation errors.    Dionne Bucy, MD 08/12/23 [phone]

## 2023-08-12 NOTE — ED Triage Notes (Addendum)
"  From [employer], confused at baseline but staff noticed he was worse today, this morning he had a fever and wasn't responding well. On our arrival 86% on room air. Axillary temp 102.8. Gave 1 gram of tylenol in route" per EMS

## 2023-08-12 NOTE — Sepsis Progress Note (Signed)
 Sepsis protocol is being followed by [facility].

## 2023-08-12 NOTE — ED Provider Notes (Signed)
Care of this patient assumed from prior physician at 1500 pending urine, reevaluation after fluids, anticipated admission. Please see prior physician note for further details.  Briefly this is an 83 year old male who presented with altered mental status and fever.  Dementia at baseline, but altered from this.  Noted to be febrile and hypoxic.  Labs notable for leukocytosis and elevated lactate.  Hypotensive on presentation, blood pressure improving with fluids.  Signed out to me pending urinalysis, reevaluation of blood pressure after fluids, anticipated admission.  Delay in obtaining urine, but this did ultimately result concerning for infection with greater than 50 white blood cells, moderate leukocyte esterase.  Lactate did uptrend, so patient was ordered for an additional small fluid bolus.  Per RN, slightly more awake on reevaluation, but remains nonverbal, withdrawing to painful stimuli, occasionally following basic commands.  Sepsis reassessment performed at 2100.  Patient has already received broad-spectrum antibiotics with aztreonam, vancomycin, Flagyl.  Sustained improved blood pressure, will reach out to hospitalist team to discuss admission.  Discussed with hospitalist team.  They will evaluate for anticipated admission.   Trinna Post, MD 08/12/23 [phone]

## 2023-08-12 NOTE — Sepsis Progress Note (Signed)
 Notified bedside nurse of need to draw 2nd lactic acid.

## 2023-08-13 DIAGNOSIS — F0283 Dementia in other diseases classified elsewhere, unspecified severity, with mood disturbance: Secondary | ICD-10-CM | POA: Diagnosis present

## 2023-08-13 DIAGNOSIS — I6389 Other cerebral infarction: Secondary | ICD-10-CM | POA: Diagnosis not present

## 2023-08-13 DIAGNOSIS — H903 Sensorineural hearing loss, bilateral: Secondary | ICD-10-CM | POA: Diagnosis present

## 2023-08-13 DIAGNOSIS — G20A1 Parkinson's disease without dyskinesia, without mention of fluctuations: Secondary | ICD-10-CM | POA: Diagnosis present

## 2023-08-13 DIAGNOSIS — R4701 Aphasia: Secondary | ICD-10-CM | POA: Diagnosis present

## 2023-08-13 DIAGNOSIS — A419 Sepsis, unspecified organism: Secondary | ICD-10-CM | POA: Diagnosis present

## 2023-08-13 DIAGNOSIS — H1033 Unspecified acute conjunctivitis, bilateral: Secondary | ICD-10-CM | POA: Diagnosis not present

## 2023-08-13 DIAGNOSIS — I959 Hypotension, unspecified: Secondary | ICD-10-CM | POA: Diagnosis present

## 2023-08-13 DIAGNOSIS — E870 Hyperosmolality and hypernatremia: Secondary | ICD-10-CM | POA: Diagnosis present

## 2023-08-13 DIAGNOSIS — Z882 Allergy status to sulfonamides status: Secondary | ICD-10-CM | POA: Diagnosis not present

## 2023-08-13 DIAGNOSIS — Z9049 Acquired absence of other specified parts of digestive tract: Secondary | ICD-10-CM | POA: Diagnosis not present

## 2023-08-13 DIAGNOSIS — E872 Acidosis, unspecified: Secondary | ICD-10-CM | POA: Diagnosis present

## 2023-08-13 DIAGNOSIS — Z801 Family history of malignant neoplasm of trachea, bronchus and lung: Secondary | ICD-10-CM | POA: Diagnosis not present

## 2023-08-13 DIAGNOSIS — F32A Depression, unspecified: Secondary | ICD-10-CM | POA: Diagnosis present

## 2023-08-13 DIAGNOSIS — Z1152 Encounter for screening for COVID-19: Secondary | ICD-10-CM | POA: Diagnosis not present

## 2023-08-13 DIAGNOSIS — N390 Urinary tract infection, site not specified: Secondary | ICD-10-CM | POA: Diagnosis present

## 2023-08-13 DIAGNOSIS — Z66 Do not resuscitate: Secondary | ICD-10-CM | POA: Diagnosis present

## 2023-08-13 DIAGNOSIS — Z880 Allergy status to penicillin: Secondary | ICD-10-CM | POA: Diagnosis not present

## 2023-08-13 DIAGNOSIS — Z8673 Personal history of transient ischemic attack (TIA), and cerebral infarction without residual deficits: Secondary | ICD-10-CM | POA: Diagnosis not present

## 2023-08-13 DIAGNOSIS — F0153 Vascular dementia, unspecified severity, with mood disturbance: Secondary | ICD-10-CM | POA: Diagnosis present

## 2023-08-13 DIAGNOSIS — L89312 Pressure ulcer of right buttock, stage 2: Secondary | ICD-10-CM | POA: Diagnosis present

## 2023-08-13 DIAGNOSIS — G9341 Metabolic encephalopathy: Secondary | ICD-10-CM | POA: Diagnosis present

## 2023-08-13 DIAGNOSIS — J9601 Acute respiratory failure with hypoxia: Secondary | ICD-10-CM | POA: Diagnosis present

## 2023-08-13 DIAGNOSIS — H109 Unspecified conjunctivitis: Secondary | ICD-10-CM | POA: Diagnosis present

## 2023-08-13 DIAGNOSIS — L89322 Pressure ulcer of left buttock, stage 2: Secondary | ICD-10-CM | POA: Diagnosis present

## 2023-08-13 DIAGNOSIS — E440 Moderate protein-calorie malnutrition: Secondary | ICD-10-CM | POA: Diagnosis present

## 2023-08-13 DIAGNOSIS — E860 Dehydration: Secondary | ICD-10-CM | POA: Diagnosis present

## 2023-08-13 DIAGNOSIS — Z79899 Other long term (current) drug therapy: Secondary | ICD-10-CM | POA: Diagnosis not present

## 2023-08-13 DIAGNOSIS — I639 Cerebral infarction, unspecified: Secondary | ICD-10-CM | POA: Diagnosis not present

## 2023-08-13 DIAGNOSIS — Z515 Encounter for palliative care: Secondary | ICD-10-CM | POA: Diagnosis not present

## 2023-08-13 LAB — CBC WITH DIFFERENTIAL/PLATELET
Abs Immature Granulocytes: 0.09 10*3/uL — ABNORMAL HIGH (ref 0.00–0.07)
Basophils Absolute: 0.1 10*3/uL (ref 0.0–0.1)
Basophils Relative: 1 %
Eosinophils Absolute: 0 10*3/uL (ref 0.0–0.5)
Eosinophils Relative: 0 %
HCT: 53.2 % — ABNORMAL HIGH (ref 39.0–52.0)
Hemoglobin: 16.1 g/dL (ref 13.0–17.0)
Immature Granulocytes: 1 %
Lymphocytes Relative: 6 %
Lymphs Abs: 1.1 10*3/uL (ref 0.7–4.0)
MCH: 29.4 pg (ref 26.0–34.0)
MCHC: 30.3 g/dL (ref 30.0–36.0)
MCV: 97.3 fL (ref 80.0–100.0)
Monocytes Absolute: 1.4 10*3/uL — ABNORMAL HIGH (ref 0.1–1.0)
Monocytes Relative: 7 %
Neutro Abs: 16.7 10*3/uL — ABNORMAL HIGH (ref 1.7–7.7)
Neutrophils Relative %: 85 %
Platelets: 281 10*3/uL (ref 150–400)
RBC: 5.47 MIL/uL (ref 4.22–5.81)
RDW: 13.9 % (ref 11.5–15.5)
WBC: 19.3 10*3/uL — ABNORMAL HIGH (ref 4.0–10.5)
nRBC: 0 % (ref 0.0–0.2)

## 2023-08-13 LAB — COMPREHENSIVE METABOLIC PANEL
ALT: 62 U/L — ABNORMAL HIGH (ref 0–44)
AST: 35 U/L (ref 15–41)
Albumin: 3.1 g/dL — ABNORMAL LOW (ref 3.5–5.0)
Alkaline Phosphatase: 60 U/L (ref 38–126)
Anion gap: 11 (ref 5–15)
BUN: 27 mg/dL — ABNORMAL HIGH (ref 8–23)
CO2: 28 mmol/L (ref 22–32)
Calcium: 8.8 mg/dL — ABNORMAL LOW (ref 8.9–10.3)
Chloride: 120 mmol/L — ABNORMAL HIGH (ref 98–111)
Creatinine, Ser: 0.72 mg/dL (ref 0.61–1.24)
GFR, Estimated: >60 mL/min (ref >60)
Glucose, Bld: 117 mg/dL — ABNORMAL HIGH (ref 70–99)
Potassium: 3.5 mmol/L (ref 3.5–5.1)
Sodium: 159 mmol/L — ABNORMAL HIGH (ref 135–145)
Total Bilirubin: 1.1 mg/dL (ref 0.0–1.2)
Total Protein: 6.4 g/dL — ABNORMAL LOW (ref 6.5–8.1)

## 2023-08-13 LAB — TSH: TSH: 0.308 u[IU]/mL — ABNORMAL LOW (ref 0.350–4.500)

## 2023-08-13 MED ORDER — POLYMYXIN B-TRIMETHOPRIM 10000-0.1 UNIT/ML-% OP SOLN
2.0000 [drp] | Freq: Four times a day (QID) | OPHTHALMIC | Status: DC
  Administered 2023-08-13 – 2023-08-22 (×35): 2 [drp] via OPHTHALMIC
  Filled 2023-08-13: qty 10

## 2023-08-13 MED ORDER — POLYVINYL ALCOHOL 1.4 % OP SOLN: 2.0000 [drp] | OPHTHALMIC | Status: DC | PRN

## 2023-08-13 MED ORDER — TRAZODONE HCL 50 MG PO TABS: 150.0000 mg | ORAL_TABLET | Freq: Every day | ORAL | Status: DC

## 2023-08-13 MED ORDER — ACETAMINOPHEN 325 MG PO TABS: 650.0000 mg | ORAL_TABLET | Freq: Four times a day (QID) | ORAL | Status: DC | PRN

## 2023-08-13 MED ORDER — QUETIAPINE FUMARATE 25 MG PO TABS: 100.0000 mg | ORAL_TABLET | Freq: Three times a day (TID) | ORAL | Status: DC

## 2023-08-13 MED ORDER — HYDRALAZINE HCL 20 MG/ML IJ SOLN: 10.0000 mg | Freq: Four times a day (QID) | INTRAMUSCULAR | Status: DC | PRN

## 2023-08-13 MED ORDER — ACETAMINOPHEN 650 MG RE SUPP
650.0000 mg | Freq: Four times a day (QID) | RECTAL | Status: DC | PRN
  Administered 2023-08-13: 650 mg via RECTAL
  Filled 2023-08-13 (×2): qty 1

## 2023-08-13 MED ORDER — VITAMIN B-12 1000 MCG PO TABS
1000.0000 ug | ORAL_TABLET | Freq: Every day | ORAL | Status: DC
  Administered 2023-08-16: 1000 ug via ORAL
  Filled 2023-08-13: qty 1

## 2023-08-13 MED ORDER — QUETIAPINE FUMARATE 25 MG PO TABS: 50.0000 mg | ORAL_TABLET | Freq: Two times a day (BID) | ORAL | Status: DC

## 2023-08-13 MED ORDER — ESCITALOPRAM OXALATE 10 MG PO TABS
5.0000 mg | ORAL_TABLET | Freq: Every day | ORAL | Status: DC
  Administered 2023-08-16: 5 mg via ORAL
  Filled 2023-08-13: qty 1

## 2023-08-13 MED ORDER — ENOXAPARIN SODIUM 40 MG/0.4ML IJ SOSY
40.0000 mg | PREFILLED_SYRINGE | INTRAMUSCULAR | Status: DC
  Administered 2023-08-13 – 2023-08-25 (×13): 40 mg via SUBCUTANEOUS
  Filled 2023-08-13 (×13): qty 0.4

## 2023-08-13 MED ORDER — SODIUM CHLORIDE 0.9 % IV SOLN
2.0000 g | Freq: Three times a day (TID) | INTRAVENOUS | Status: DC
Start: 2023-08-13 — End: 2023-08-14
  Administered 2023-08-13 – 2023-08-14 (×5): 2 g via INTRAVENOUS
  Filled 2023-08-13 (×5): qty 10

## 2023-08-13 MED ORDER — METRONIDAZOLE 500 MG/100ML IV SOLN
500.0000 mg | Freq: Two times a day (BID) | INTRAVENOUS | Status: DC
  Administered 2023-08-13 – 2023-08-14 (×3): 500 mg via INTRAVENOUS
  Filled 2023-08-13 (×4): qty 100

## 2023-08-13 MED ORDER — LACTATED RINGERS IV SOLN: INTRAVENOUS | Status: DC

## 2023-08-13 MED ORDER — BISACODYL 10 MG RE SUPP: 10.0000 mg | Freq: Every day | RECTAL | Status: DC | PRN

## 2023-08-13 MED ORDER — BISACODYL 5 MG PO TBEC: 10.0000 mg | DELAYED_RELEASE_TABLET | Freq: Every day | ORAL | Status: DC

## 2023-08-13 MED ORDER — CARBIDOPA-LEVODOPA 25-100 MG PO TABS
2.0000 | ORAL_TABLET | Freq: Three times a day (TID) | ORAL | Status: DC
  Filled 2023-08-13 (×3): qty 2

## 2023-08-13 MED ORDER — LATANOPROST 0.005 % OP SOLN
1.0000 [drp] | Freq: Every day | OPHTHALMIC | Status: DC
Start: 2023-08-13 — End: 2023-08-25
  Administered 2023-08-13 – 2023-08-24 (×12): 1 [drp] via OPHTHALMIC
  Filled 2023-08-13 (×2): qty 2.5

## 2023-08-13 MED ORDER — DOXAZOSIN MESYLATE 1 MG PO TABS
1.0000 mg | ORAL_TABLET | Freq: Every day | ORAL | Status: DC
  Administered 2023-08-16: 1 mg via ORAL
  Filled 2023-08-13 (×4): qty 1

## 2023-08-13 MED ORDER — LABETALOL HCL 5 MG/ML IV SOLN: 10.0000 mg | INTRAVENOUS | Status: DC | PRN

## 2023-08-13 MED ORDER — POLYETHYLENE GLYCOL 3350 17 G PO PACK
17.0000 g | PACK | Freq: Two times a day (BID) | ORAL | Status: DC
  Filled 2023-08-13: qty 1

## 2023-08-13 NOTE — Assessment & Plan Note (Signed)
Patient with history of parkinson's disease. At discharge was to be on sinemet 25-100 2 tablets TID. Spoke with son, Avon Gully - he reports that he is unaware of a neurology evaluation resulting in the diagnosis of parkinson's disease and he questions this diagnosis.  Plan Continue current dose of Sinemet  Routine neurology consultation to confirm diagnosis - contact later in hospitalization.

## 2023-08-13 NOTE — Assessment & Plan Note (Signed)
Patient is non verbal. Probable baseline of dementia with exacerbation 2/2 hearing loss (no cochlear implant) and sepsis.  Plan Address underlying infection   Continue home meds  Delerium precautions.

## 2023-08-13 NOTE — H&P (Signed)
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 He was recently hospitalized for toxic metabolic encephalopathy  1/2-08/03/23. His course was complicated by urinary retention. He has been non-verbal in the past. He presents to [facility] for worsening mental status, fever and hypoxemia.   ED Course: Tmax 101 down to 98.8 after resuscitation, BP 87/45 to 116/57 after resuscitation, HR 98  R 15. Patient is difficult to arouse, he is non-verbal, does not appear agitated. Lab: Serum lactic acid 2.3 to 3.3 to 2.5. Na 158, Cholride 120, Glucose 138  BUN 46, Cr 1.11, Troponin 19, WBC 15.4K with 87/8/8, Hgb 17.6, Plts 325. U/A - cludy, moderate LE, SpGr 1.030, rare bacteria, 21-50 RBC/hpf, > 50 WBC/hpf. CT head with chronic occipital infarct; Right frontal, ethmoid and maxillary sinus disease. In ED he received 2L LR, 500cc NS, Aztreonam 2g, Vancomycin 1 g, Flagyl 500 mg. TRH called to admit for continued treatment of urosepsis.   Review of Systems:  Review of Systems  Unable to perform ROS: Patient nonverbal  Psychiatric/Behavioral:         Not responsive    Past Medical History:  Diagnosis Date   Allergy    Hearing deficit    Parkinson's disease [facility])     Past Surgical History:  Procedure Laterality Date   COCHLEAR IMPLANT Left    PARTIAL COLECTOMY       reports that he has never smoked. He has never used smokeless tobacco. He reports that he does not currently use alcohol. He reports that he does not use drugs.  Allergies  Allergen Reactions   Opium Poppy [Papaver]  Anaphylaxis and Hives   Penicillins Anaphylaxis and Rash   Sulfa Antibiotics Anaphylaxis   Penicillins    Sulfa Antibiotics     Family History  Problem Relation Age of Onset   Lung cancer Mother     Prior to Admission medications   Medication Sig Start Date End Date Taking? Authorizing Provider  acetaminophen (TYLENOL) 325 MG tablet Take 2 tablets (650 mg total) by mouth every 6 (six) hours as needed for mild pain (pain score 1-3) or fever. 07/21/23   Sunnie Nielsen, DO  bisacodyl (DULCOLAX) 10 MG suppository Place 1 suppository (10 mg total) rectally daily as needed for severe constipation. 07/21/23   Sunnie Nielsen, DO  bisacodyl (DULCOLAX) 5 MG EC tablet Take 2 tablets (10 mg total) by mouth at bedtime. 07/21/23   Sunnie Nielsen, DO  carbidopa-levodopa (SINEMET IR) 25-100 MG tablet Take 2 tablets by mouth 3 (three) times daily. 10/26/22   [provider]  cyanocobalamin (VITAMIN B12) 1000 MCG tablet Take 1,000 mcg by mouth daily.    [provider]  doxazosin (CARDURA) 1 MG tablet Take 1 tablet (1 mg total) by mouth daily. 07/22/23   Sunnie Nielsen, DO  escitalopram (LEXAPRO) 5 MG tablet Take 5 mg by mouth daily.    [provider]  latanoprost (XALATAN) 0.005 % ophthalmic solution Place 1 drop into both eyes at bedtime. 11/26/19   [provider]  polyethylene glycol (MIRALAX / GLYCOLAX) 17 g packet Take 17  g by mouth 2 (two) times daily. 07/21/23   Sunnie Nielsen, DO  polyvinyl alcohol (LIQUIFILM TEARS) 1.4 % ophthalmic solution Place 2 drops into both eyes as needed for dry eyes. 07/21/23   Sunnie Nielsen, DO  QUEtiapine (SEROQUEL) 100 MG tablet Take 1 tablet (100 mg total) by mouth 3 (three) times daily. 08/03/23   Leeroy Bock, MD  traZODone (DESYREL) 150 MG tablet Take 1 tablet (150 mg total) by mouth at bedtime. 08/03/23   Leeroy Bock, MD    Physical Exam: Vitals:   08/12/23 2000 08/12/23 2030 08/12/23 2130 08/12/23  2200  BP: (!) 116/57 139/68 115/65 (!) 116/57  Pulse: 97 96 94 98  Resp: 14 (!) 22 18 15   Temp:      TempSrc:      SpO2: 95% 99% 99% 98%  Weight:      Height:        Physical Exam Vitals and nursing note reviewed.  Constitutional:      Appearance: He is normal weight.     Comments: Awakens but does not respond to directions, does not speak.   HENT:     Head: Normocephalic and atraumatic.     Nose: Nose normal.     Mouth/Throat:     Mouth: Mucous membranes are dry.     Pharynx: Oropharyngeal exudate present.     Comments: Very poor dentition. Abundant dry mucus on gums, mucus membranes, throat. Eyes:     General:        Right eye: Discharge present.        Left eye: Discharge present.    Extraocular Movements: Extraocular movements intact.     Pupils: Pupils are equal, round, and reactive to light.     Comments: Erythema palpebral conjunctive bilaterally, erythem bulbar conjunctiva. Thick exudate  Cardiovascular:     Rate and Rhythm: Normal rate and regular rhythm.     Pulses: Normal pulses.     Heart sounds: Normal heart sounds.  Pulmonary:     Effort: Pulmonary effort is normal.     Breath sounds: Normal breath sounds.  Abdominal:     General: Bowel sounds are normal.     Palpations: Abdomen is soft.  Musculoskeletal:        General: No swelling or deformity.     Cervical back: Normal range of motion.     Right lower leg: No edema.     Left lower leg: No edema.  Lymphadenopathy:     Cervical: No cervical adenopathy.  Skin:    General: Skin is warm and dry.     Findings: Erythema present.     Comments: Neck and upper chest with diffuse macular erythema  Neurological:     Mental Status: He is disoriented.     Cranial Nerves: No cranial nerve deficit.     Comments: Spontaneous movement all extremities. Does not follow commands  Psychiatric:     Comments: Withdrawn, not re      Labs on Admission: I have personally reviewed following labs and imaging  studies  CBC: Recent Labs  Lab 08/12/23 1428  WBC 15.4*  NEUTROABS 12.9*  HGB 17.6*  HCT 58.1*  MCV 96.2  PLT 325   Basic Metabolic Panel: Recent Labs  Lab 08/12/23 1428  NA 158*  K 4.0  CL 120*  CO2 25  GLUCOSE 138*  BUN 46*  CREATININE 1.11  CALCIUM 8.9   GFR: Estimated Creatinine Clearance: 45.4 mL/min (by C-G formula based on SCr  of 1.11 mg/dL). Liver Function Tests: Recent Labs  Lab 08/12/23 1428  AST 61*  ALT 25  ALKPHOS 72  BILITOT 1.8*  PROT 6.8  ALBUMIN 3.5   No results for input(s): "LIPASE", "AMYLASE" in the last 168 hours. No results for input(s): "AMMONIA" in the last 168 hours. Coagulation Profile: No results for input(s): "INR", "PROTIME" in the last 168 hours. Cardiac Enzymes: No results for input(s): "CKTOTAL", "CKMB", "CKMBINDEX", "TROPONINI" in the last 168 hours. BNP (last 3 results) No results for input(s): "PROBNP" in the last 8760 hours. HbA1C: No results for input(s): "HGBA1C" in the last 72 hours. CBG: No results for input(s): "GLUCAP" in the last 168 hours. Lipid Profile: No results for input(s): "CHOL", "HDL", "LDLCALC", "TRIG", "CHOLHDL", "LDLDIRECT" in the last 72 hours. Thyroid Function Tests: No results for input(s): "TSH", "T4TOTAL", "FREET4", "T3FREE", "THYROIDAB" in the last 72 hours. Anemia Panel: No results for input(s): "VITAMINB12", "FOLATE", "FERRITIN", "TIBC", "IRON", "RETICCTPCT" in the last 72 hours. Urine analysis:    Component Value Date/Time   COLORURINE AMBER (A) 08/12/2023 2055   APPEARANCEUR CLOUDY (A) 08/12/2023 2055   LABSPEC 1.030 08/12/2023 2055   PHURINE 5.0 08/12/2023 2055   GLUCOSEU NEGATIVE 08/12/2023 2055   HGBUR SMALL (A) 08/12/2023 2055   BILIRUBINUR NEGATIVE 08/12/2023 2055   KETONESUR NEGATIVE 08/12/2023 2055   PROTEINUR 100 (A) 08/12/2023 2055   NITRITE NEGATIVE 08/12/2023 2055   LEUKOCYTESUR MODERATE (A) 08/12/2023 2055    Radiological Exams on Admission: I have personally reviewed  images CT Head Wo Contrast Result Date: 08/12/2023 CLINICAL DATA:  Altered mental status. EXAM: CT HEAD WITHOUT CONTRAST TECHNIQUE: Contiguous axial images were obtained from the base of the skull through the vertex without intravenous contrast. RADIATION DOSE REDUCTION: This exam was performed according to the departmental dose-optimization program which includes automated exposure control, adjustment of the mA and/or kV according to patient size and/or use of iterative reconstruction technique. COMPARISON:  CT head dated 06/29/2023. FINDINGS: Streak artifact from a left-sided cochlear implant and motion artifact limit the sensitivity of the exam. Brain: A moderate volume area of hypoattenuation of the right occipital lobe in the posterior cerebral artery territory is favored to reflect chronic infarct. This is new or progressed since 06/29/2023. No evidence of acute hemorrhage, hydrocephalus, extra-axial collection or mass lesion/mass effect. There is mild cerebral volume loss with associated ex vacuo dilatation. Periventricular white matter hypoattenuation likely represents chronic small vessel ischemic disease. 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 Vascular: There are vascular calcifications in the carotid siphons. Skull: Status post left mastoidectomy and cochlear implant. Negative for fracture or focal lesion. Sinuses/Orbits: There is right frontal, ethmoid, and maxillary sinus disease. Other: None. IMPRESSION: 1. Moderate volume area of hypoattenuation of the right occipital lobe in the posterior cerebral artery territory is favored to reflect chronic infarct. This is new or progressed since 06/29/2023. 2. Right frontal, ethmoid, and maxillary sinus disease. Electronically Signed   By: Romona Curls M.D.   On: 08/12/2023 15:11   DG Chest Port 1 View Result Date: 08/12/2023 CLINICAL DATA:  Questionable sepsis - evaluate for abnormality EXAM: PORTABLE CHEST 1  VIEW COMPARISON:  None Available. FINDINGS: The cardiomediastinal silhouette is mildly enlarged in contour.Atherosclerotic calcifications. No pleural effusion. No pneumothorax. No acute pleuroparenchymal abnormality. Leftward deviation of the superior trachea. IMPRESSION: 1. No acute cardiopulmonary abnormality. 2. Leftward deviation of the superior trachea. This could reflect a thyroid goiter. This could be further assessed with dedicated nonemergent  outpatient thyroid ultrasound. Electronically Signed   By: Meda Klinefelter M.D.   On: 08/12/2023 14:49    EKG: I have personally reviewed EKG: Sinus tachycardia, no acute changes  Assessment/Plan Principal Problem:   Sepsis secondary to UTI [facility]) Active Problems:   Acute metabolic encephalopathy   SNHL (sensory-neural hearing loss), asymmetrical   Conjunctivitis   Parkinson's disease (HCC)    Assessment and Plan: * Sepsis secondary to UTI [facility]) Patient was febrile to 101 at admission and hypotensive with altered mental status, leukocytosis to 15.4 with left shift 83/8/8. U/A came back as positive. In ED resuscitation initiated: two L LR given, then 500 c NS; abx were administered: Vancomycin 1g, Aztreonam 2g, Flagyl 500 mg. His blood pressure improved after resuscitation from 97/45 to 116/57.  Plan Tele admit  Continue aztreonam and flagyl. Will not continue Vancomycin - patient with erythema upper body, low risk of MRSA.   Continue LR at 150 cc hr x 24 hrs  Will need PT/OT reassessment as he recovers.  TOC consult for return to SNF  Acute metabolic encephalopathy Patient is non verbal. Probable baseline of dementia with exacerbation 2/2 hearing loss (no cochlear implant) and sepsis.  Plan Address underlying infection   Continue home meds  Delerium precautions.   Conjunctivitis On Examination patient with bilateral erythem bulbar and palpebral conjunctiva with thick creamy exudate.  Plan Polytrim 2qtts OU qid  SNHL  (sensory-neural hearing loss), asymmetrical Patient with known left sensory-neural hearing loss. He did have a cochlear implant but by report this came out.  Plan Will need special assistance with communication.   Parkinson's disease [facility]) Patient with history of parkinson's disease. At discharge was to be on sinemet 25-100 2 tablets TID. Spoke with son, Avon Gully - he reports that he is unaware of a neurology evaluation resulting in the diagnosis of parkinson's disease and he questions this diagnosis.  Plan Continue current dose of Sinemet  Routine neurology consultation to confirm diagnosis - contact later in hospitalization.    Disposition - TOC consult for return to SNF when stable    DVT prophylaxis: Lovenox Code Status: DNR - discussed with son. ON file is HCPOA form and advanced directive. DNR/DNI, no tube feeding.  Family Communication: spoke with son  Josh Nicolosi Disposition Plan: return to [facility] when stable  Consults called: neurology - rounding team to place routine consult request  Admission status: Inpatient, Med-Surg   Illene Regulus, MD [facility] 08/13/2023, 1:23 AM

## 2023-08-13 NOTE — Assessment & Plan Note (Signed)
Patient was febrile to 101 at admission and hypotensive with altered mental status, leukocytosis to 15.4 with left shift 83/8/8. U/A came back as positive. In ED resuscitation initiated: two L LR given, then 500 c NS; abx were administered: Vancomycin 1g, Aztreonam 2g, Flagyl 500 mg. His blood pressure improved after resuscitation from 97/45 to 116/57.  Plan Tele admit  Continue aztreonam and flagyl. Will not continue Vancomycin - patient with erythema upper body, low risk of MRSA.   Continue LR at 150 cc hr x 24 hrs  Will need PT/OT reassessment as he recovers.  TOC consult for return to SNF

## 2023-08-13 NOTE — ED Notes (Addendum)
Patient's temp 100.9. This RN [employer], DO to request tylenol suppository instead of PO tylenol. Pt still only responsive to painful stimuli.

## 2023-08-13 NOTE — H&P (Incomplete)
History and Physical    Matthan Sledge AVW:[id number] DOB: 22-Oct-1940 DOA: 08/12/2023  DOS: the patient was seen and examined on 08/12/2023  PCP: Pcp, No   Patient coming from: SNF  I have personally briefly reviewed patient's old medical records in [facility]  Mr. 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 He was recently hospitalized for toxic metabolic encephalopathy  1/2-08/03/23. His course was complicated by urinary retention. He has been non-verbal in the past. He presents to [facility] for worsening mental status, fever and hypoxemia.   ED Course: Tmax 101 down to 98.8 after resuscitation, BP 87/45 to 116/57 after resuscitation, HR 98  R 15. Patient is difficult to arouse, he is non-verbal, does not appear agitated. Lab: Serum lactic acid 2.3 to 3.3 to 2.5. Na 158, Cholride 120, Glucose 138  BUN 46, Cr 1.11, Troponin 19, WBC 15.4K with 87/8/8, Hgb 17.6, Plts 325. U/A - cludy, moderate LE, SpGr 1.030, rare bacteria, 21-50 RBC/hpf, > 50 WBC/hpf. CT head with chronic occipital infarct; Right frontal, ethmoid and maxillary sinus disease. In ED he received 2L LR, 500cc NS, Aztreonam 2g, Vancomycin 1 g, Flagyl 500 mg. TRH called to admit for continued treatment of urosepsis.   Review of Systems:  Review of Systems  Unable to perform ROS: Patient nonverbal  Psychiatric/Behavioral:         Not responsive    Past Medical History:  Diagnosis Date  . Allergy   . Hearing deficit   . Parkinson's disease [facility])     Past Surgical History:  Procedure Laterality Date  . COCHLEAR IMPLANT Left   . PARTIAL COLECTOMY       reports that he has never smoked. He has never used smokeless tobacco. He reports that he does not currently use alcohol. He reports that he does not use drugs.  Allergies  Allergen Reactions  . Opium Poppy [Papaver]  Anaphylaxis and Hives  . Penicillins Anaphylaxis and Rash  . Sulfa Antibiotics Anaphylaxis  . Penicillins   . Sulfa Antibiotics     Family History  Problem Relation Age of Onset  . Lung cancer Mother     Prior to Admission medications   Medication Sig Start Date End Date Taking? Authorizing Provider  acetaminophen (TYLENOL) 325 MG tablet Take 2 tablets (650 mg total) by mouth every 6 (six) hours as needed for mild pain (pain score 1-3) or fever. 07/21/23   Sunnie Nielsen, DO  bisacodyl (DULCOLAX) 10 MG suppository Place 1 suppository (10 mg total) rectally daily as needed for severe constipation. 07/21/23   Sunnie Nielsen, DO  bisacodyl (DULCOLAX) 5 MG EC tablet Take 2 tablets (10 mg total) by mouth at bedtime. 07/21/23   Sunnie Nielsen, DO  carbidopa-levodopa (SINEMET IR) 25-100 MG tablet Take 2 tablets by mouth 3 (three) times daily. 10/26/22   [provider]  cyanocobalamin (VITAMIN B12) 1000 MCG tablet Take 1,000 mcg by mouth daily.    [provider]  doxazosin (CARDURA) 1 MG tablet Take 1 tablet (1 mg total) by mouth daily. 07/22/23   Sunnie Nielsen, DO  escitalopram (LEXAPRO) 5 MG tablet Take 5 mg by mouth daily.    [provider]  latanoprost (XALATAN) 0.005 % ophthalmic solution Place 1 drop into both eyes at bedtime. 11/26/19   [provider]  polyethylene glycol (MIRALAX / GLYCOLAX) 17 g packet Take 17  g by mouth 2 (two) times daily. 07/21/23   Sunnie Nielsen, DO  polyvinyl alcohol (LIQUIFILM TEARS) 1.4 % ophthalmic solution Place 2 drops into both eyes as needed for dry eyes. 07/21/23   Sunnie Nielsen, DO  QUEtiapine (SEROQUEL) 100 MG tablet Take 1 tablet (100 mg total) by mouth 3 (three) times daily. 08/03/23   Leeroy Bock, MD  traZODone (DESYREL) 150 MG tablet Take 1 tablet (150 mg total) by mouth at bedtime. 08/03/23   Leeroy Bock, MD    Physical Exam: Vitals:   08/12/23 2000 08/12/23 2030 08/12/23 2130  08/12/23 2200  BP: (!) 116/57 139/68 115/65 (!) 116/57  Pulse: 97 96 94 98  Resp: 14 (!) 22 18 15   Temp:      TempSrc:      SpO2: 95% 99% 99% 98%  Weight:      Height:        Physical Exam Vitals and nursing note reviewed.  Constitutional:      Appearance: He is normal weight.     Comments: Awakens but does not respond to directions, does not speak.   HENT:     Head: Normocephalic and atraumatic.     Nose: Nose normal.     Mouth/Throat:     Mouth: Mucous membranes are dry.     Pharynx: Oropharyngeal exudate present.     Comments: Very poor dentition. Abundant dry mucus on gums, mucus membranes, throat. Eyes:     General:        Right eye: Discharge present.        Left eye: Discharge present.    Extraocular Movements: Extraocular movements intact.     Pupils: Pupils are equal, round, and reactive to light.     Comments: Erythema palpebral conjunctive bilaterally, erythem bulbar conjunctiva. Thick exudate  Cardiovascular:     Rate and Rhythm: Normal rate and regular rhythm.     Pulses: Normal pulses.     Heart sounds: Normal heart sounds.  Pulmonary:     Effort: Pulmonary effort is normal.     Breath sounds: Normal breath sounds.  Abdominal:     General: Bowel sounds are normal.     Palpations: Abdomen is soft.  Musculoskeletal:        General: No swelling or deformity.     Cervical back: Normal range of motion.     Right lower leg: No edema.     Left lower leg: No edema.  Lymphadenopathy:     Cervical: No cervical adenopathy.  Skin:    General: Skin is warm and dry.     Findings: Erythema present.     Comments: Neck and upper chest with diffuse macular erythema  Neurological:     Mental Status: He is disoriented.     Cranial Nerves: No cranial nerve deficit.     Comments: Spontaneous movement all extremities. Does not follow commands  Psychiatric:     Comments: Withdrawn, not re      Labs on Admission: I have personally reviewed following labs and imaging  studies  CBC: Recent Labs  Lab 08/12/23 1428  WBC 15.4*  NEUTROABS 12.9*  HGB 17.6*  HCT 58.1*  MCV 96.2  PLT 325   Basic Metabolic Panel: Recent Labs  Lab 08/12/23 1428  NA 158*  K 4.0  CL 120*  CO2 25  GLUCOSE 138*  BUN 46*  CREATININE 1.11  CALCIUM 8.9   GFR: Estimated Creatinine Clearance: 45.4 mL/min (by C-G formula based on SCr  of 1.11 mg/dL). Liver Function Tests: Recent Labs  Lab 08/12/23 1428  AST 61*  ALT 25  ALKPHOS 72  BILITOT 1.8*  PROT 6.8  ALBUMIN 3.5   No results for input(s): "LIPASE", "AMYLASE" in the last 168 hours. No results for input(s): "AMMONIA" in the last 168 hours. Coagulation Profile: No results for input(s): "INR", "PROTIME" in the last 168 hours. Cardiac Enzymes: No results for input(s): "CKTOTAL", "CKMB", "CKMBINDEX", "TROPONINI" in the last 168 hours. BNP (last 3 results) No results for input(s): "PROBNP" in the last 8760 hours. HbA1C: No results for input(s): "HGBA1C" in the last 72 hours. CBG: No results for input(s): "GLUCAP" in the last 168 hours. Lipid Profile: No results for input(s): "CHOL", "HDL", "LDLCALC", "TRIG", "CHOLHDL", "LDLDIRECT" in the last 72 hours. Thyroid Function Tests: No results for input(s): "TSH", "T4TOTAL", "FREET4", "T3FREE", "THYROIDAB" in the last 72 hours. Anemia Panel: No results for input(s): "VITAMINB12", "FOLATE", "FERRITIN", "TIBC", "IRON", "RETICCTPCT" in the last 72 hours. Urine analysis:    Component Value Date/Time   COLORURINE AMBER (A) 08/12/2023 2055   APPEARANCEUR CLOUDY (A) 08/12/2023 2055   LABSPEC 1.030 08/12/2023 2055   PHURINE 5.0 08/12/2023 2055   GLUCOSEU NEGATIVE 08/12/2023 2055   HGBUR SMALL (A) 08/12/2023 2055   BILIRUBINUR NEGATIVE 08/12/2023 2055   KETONESUR NEGATIVE 08/12/2023 2055   PROTEINUR 100 (A) 08/12/2023 2055   NITRITE NEGATIVE 08/12/2023 2055   LEUKOCYTESUR MODERATE (A) 08/12/2023 2055    Radiological Exams on Admission: I have personally reviewed  images CT Head Wo Contrast Result Date: 08/12/2023 CLINICAL DATA:  Altered mental status. EXAM: CT HEAD WITHOUT CONTRAST TECHNIQUE: Contiguous axial images were obtained from the base of the skull through the vertex without intravenous contrast. RADIATION DOSE REDUCTION: This exam was performed according to the departmental dose-optimization program which includes automated exposure control, adjustment of the mA and/or kV according to patient size and/or use of iterative reconstruction technique. COMPARISON:  CT head dated 06/29/2023. FINDINGS: Streak artifact from a left-sided cochlear implant and motion artifact limit the sensitivity of the exam. Brain: A moderate volume area of hypoattenuation of the right occipital lobe in the posterior cerebral artery territory is favored to reflect chronic infarct. This is new or progressed since 06/29/2023. No evidence of acute hemorrhage, hydrocephalus, extra-axial collection or mass lesion/mass effect. There is mild cerebral volume loss with associated ex vacuo dilatation. Periventricular white matter hypoattenuation likely represents chronic small vessel ischemic disease. 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 Vascular: There are vascular calcifications in the carotid siphons. Skull: Status post left mastoidectomy and cochlear implant. Negative for fracture or focal lesion. Sinuses/Orbits: There is right frontal, ethmoid, and maxillary sinus disease. Other: None. IMPRESSION: 1. Moderate volume area of hypoattenuation of the right occipital lobe in the posterior cerebral artery territory is favored to reflect chronic infarct. This is new or progressed since 06/29/2023. 2. Right frontal, ethmoid, and maxillary sinus disease. Electronically Signed   By: Romona Curls M.D.   On: 08/12/2023 15:11   DG Chest Port 1 View Result Date: 08/12/2023 CLINICAL DATA:  Questionable sepsis - evaluate for abnormality EXAM: PORTABLE CHEST 1  VIEW COMPARISON:  None Available. FINDINGS: The cardiomediastinal silhouette is mildly enlarged in contour.Atherosclerotic calcifications. No pleural effusion. No pneumothorax. No acute pleuroparenchymal abnormality. Leftward deviation of the superior trachea. IMPRESSION: 1. No acute cardiopulmonary abnormality. 2. Leftward deviation of the superior trachea. This could reflect a thyroid goiter. This could be further assessed with dedicated nonemergent  outpatient thyroid ultrasound. Electronically Signed   By: Meda Klinefelter M.D.   On: 08/12/2023 14:49    EKG: I have personally reviewed EKG: Sinus tachycardia, no acute changes  Assessment/Plan Principal Problem:   Sepsis secondary to UTI [facility]) Active Problems:   Acute metabolic encephalopathy   SNHL (sensory-neural hearing loss), asymmetrical   Conjunctivitis   Parkinson's disease (HCC)    Assessment and Plan: * Sepsis secondary to UTI [facility]) Patient was febrile to 101 at admission and hypotensive with altered mental status, leukocytosis to 15.4 with left shift 83/8/8. U/A came back as positive. In ED resuscitation initiated: two L LR given, then 500 c NS; abx were administered: Vancomycin 1g, Aztreonam 2g, Flagyl 500 mg. His blood pressure improved after resuscitation from 97/45 to 116/57.  Plan Tele admit  Continue aztreonam and flagyl. Will not continue Vancomycin - patient with erythema upper body, low risk of MRSA.   Continue LR at 150 cc hr x 24 hrs  Will need PT/OT reassessment as he recovers.  TOC consult for return to SNF  Acute metabolic encephalopathy Patient is non verbal. Probable baseline of dementia with exacerbation 2/2 hearing loss (no cochlear implant) and sepsis.  Plan Address underlying infection   Continue home meds  Delerium precautions.   Conjunctivitis On Examination patient with bilateral erythem bulbar and palpebral conjunctiva with thick creamy exudate.  Plan Polytrim 2qtts OU qid  SNHL  (sensory-neural hearing loss), asymmetrical Patient with known left sensory-neural hearing loss. He did have a cochlear implant but by report this came out.  Plan Will need special assistance with communication.   Parkinson's disease [facility]) Patient with history of parkinson's disease. At discharge was to be on sinemet 25-100 2 tablets TID  Plan Continue current dose of Sinemet   Disposition - TOC consult for return to SNF when stable    DVT prophylaxis: Lovenox Code Status: Full Code Family Communication: ***  Disposition Plan: ***  Consults called: ***  Admission status: {Blank single:19197::"Observation","Inpatient"}, {Blank single:19197::"Med-Surg","Telemetry bed","Step Down Unit"}   Illene Regulus, MD [facility] 08/13/2023, 12:57 AM

## 2023-08-13 NOTE — Progress Notes (Signed)
PROGRESS NOTE    Justin Bird  ZOX:[id number] DOB: 1941/04/15 DOA: 08/12/2023 PCP: Pcp, No  Chief Complaint  Patient presents with   Fever    Hospital Course:  Justin Bird is 83 y.o. 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  Recent hospital course was complicated by urinary retention.  He has been nonverbal in the past.  He presents on this admission with worsening mental status, fever, hypoxemia On arrival to the ED Tmax 101, BP 87/45 which improved with fluid resuscitation.  Labs reveal mild lactic acidosis, hypernatremia 158, chloride 120, glucose 138, WBC 15.4, hemoglobin 17.6.  CT head reveals chronic occipital infarct as well as right frontal, ethmoid and maxillary sinus disease.  He received 2 L LR, 500 cc NS, aztreonam, vancomycin, Flagyl.  [facility] admitted the patient for sepsis.  Subjective: On my arrival the patient is lying in bed, open mouth, he does not interact for exam.  Does not interact on sternal rub.   Objective: Vitals:   08/13/23 0130 08/13/23 0200 08/13/23 0500 08/13/23 0755  BP: (!) 121/44 122/62 131/71 132/68  Pulse: 98 96 96 98  Resp: (!) 24 (!) 21 (!) 21 (!) 22  Temp:   98.2 F (36.8 C) 99.7 F (37.6 C)  TempSrc:   Axillary Axillary  SpO2: 96% 96% 98% 96%  Weight:      Height:        Intake/Output Summary (Last 24 hours) at 08/13/2023 1044 Last data filed at 08/12/2023 2135 Gross per 24 hour  Intake 3162.27 ml  Output --  Net 3162.27 ml   Filed Weights   08/12/23 1420  Weight: 68 kg    Examination: General exam: Appears calm and comfortable, NAD  Respiratory system: No work of breathing, symmetric chest wall expansion, mouth open Cardiovascular system: S1 & S2 heard, RRR.  Gastrointestinal system: Abdomen is nondistended, soft and nontender.  Neuro: obtunded, does not  interact or follow commands, grimace to sternal rub  Extremities: Symmetric, expected ROM Skin: No rashes, lesions Psychiatry: cannot assess  Assessment & Plan:  Principal Problem:   Sepsis secondary to UTI [facility]) Active Problems:   Acute metabolic encephalopathy   SNHL (sensory-neural hearing loss), asymmetrical   Conjunctivitis   Parkinson's disease (HCC)     Sepsis - Criteria met on arrival with: Fever 101, hypotensive, altered mental status, leukocytosis -- Presumed urinary source, CXR, blood cultures pending - Status post fluid resuscitation - Continue azteonam and Flagyl.  Reportedly patient had erythema on his upper body with vancomycin since this has since been discontinued. - MRSA PCR pending -- TSH pending - Continue IV fluid resuscitation - PT/OT when he recovers  Acute metabolic encephalopathy - Likely has baseline dementia exacerbated by hearing loss and sepsis - Expect resolution of baseline as infection clears  Conjunctivitis - Also has purulent discharge bilaterally - Continue with Polytrim OU 4 times daily  Sensorineural hearing loss - Chronic, has had prior cochlear implant but this has since been lost. - Needs assistance with communication  Parkinson's disease - Continue home meds for now - Neurology was consulted, but this requires outpatient medication management and slow titration.  Will need close outpatient follow-up.  Hypernatremia - Suspect that this is due to hypovolemia and sepsis - Repeat CMP  Unclear baseline - Patient recently had prolonged hospital stay for sepsis, and his son reports during that time he was heavily  sedated.  He comes to [place] now from [facility] where he is on 100 mg Seroquel 3 times daily and trazodone 150 mg.  I will discontinue the trazodone and start tapering the Seroquel aggressively.  I do suspect that this is over sedating to the patient.  Poor prognosis - Patient's age, comorbidities, and recent  hospitalization for toxic metabolic encephalopathy with repeat admission not long after discharge is concerning that patient is nearing end-of-life. - Consult palliative care for evaluation in the morning     Code Status: Limited: Do not attempt resuscitation (DNR) -DNR-LIMITED -Do Not Intubate/DNI  Family Communication: None at bedside. Discussed with son, Justin Bird on the phone. Disposition:  Inpatient, septic, workup and IV abx ongoing. Eventual DC back to SNF   Consultants:    Procedures:    Antimicrobials:  Anti-infectives (From admission, onward)    Start     Dose/Rate Route Frequency Ordered Stop   08/13/23 0800  metroNIDAZOLE (FLAGYL) IVPB 500 mg        500 mg 100 mL/hr over 60 Minutes Intravenous Every 12 hours 08/13/23 0506     08/13/23 0600  aztreonam (AZACTAM) 2 g in sodium chloride 0.9 % 100 mL IVPB        2 g 200 mL/hr over 30 Minutes Intravenous Every 8 hours 08/13/23 0506     08/12/23 1430  aztreonam (AZACTAM) 2 g in sodium chloride 0.9 % 100 mL IVPB        2 g 200 mL/hr over 30 Minutes Intravenous  Once 08/12/23 1418 08/12/23 1600   08/12/23 1430  metroNIDAZOLE (FLAGYL) IVPB 500 mg        500 mg 100 mL/hr over 60 Minutes Intravenous  Once 08/12/23 1418 08/12/23 1640   08/12/23 1430  vancomycin (VANCOCIN) IVPB 1000 mg/200 mL premix        1,000 mg 200 mL/hr over 60 Minutes Intravenous  Once 08/12/23 1418 08/12/23 1640       Data Reviewed: I have personally reviewed following labs and imaging studies CBC: Recent Labs  Lab 08/12/23 1428  WBC 15.4*  NEUTROABS 12.9*  HGB 17.6*  HCT 58.1*  MCV 96.2  PLT 325   Basic Metabolic Panel: Recent Labs  Lab 08/12/23 1428  NA 158*  K 4.0  CL 120*  CO2 25  GLUCOSE 138*  BUN 46*  CREATININE 1.11  CALCIUM 8.9   GFR: Estimated Creatinine Clearance: 45.4 mL/min (by C-G formula based on SCr of 1.11 mg/dL). Liver Function Tests: Recent Labs  Lab 08/12/23 1428  AST 61*  ALT 25  ALKPHOS 72  BILITOT 1.8*   PROT 6.8  ALBUMIN 3.5   CBG: No results for input(s): "GLUCAP" in the last 168 hours.  Recent Results (from the past 240 hours)  Blood Culture (routine x 2)     Status: None (Preliminary result)   Collection Time: 08/12/23  2:23 PM   Specimen: BLOOD  Result Value Ref Range Status   Specimen Description BLOOD BLOOD LEFT ARM  Final   Special Requests   Final    BOTTLES DRAWN AEROBIC AND ANAEROBIC Blood Culture adequate volume   Culture   Final    NO GROWTH < 24 HOURS Performed at [name], [address] [place], [place] [address]    Report Status PENDING  Incomplete  Resp panel by RT-PCR (RSV, Flu A&B, Covid) Anterior Nasal Swab     Status: None   Collection Time: 08/12/23  2:28 PM   Specimen: Anterior Nasal  Swab  Result Value Ref Range Status   SARS Coronavirus 2 by RT PCR NEGATIVE NEGATIVE Final    Comment: (NOTE) SARS-CoV-2 target nucleic acids are NOT DETECTED.  The SARS-CoV-2 RNA is generally detectable in upper respiratory specimens during the acute phase of infection. The lowest concentration of SARS-CoV-2 viral copies this assay can detect is 138 copies/mL. A negative result does not preclude SARS-Cov-2 infection and should not be used as the sole basis for treatment or other patient management decisions. A negative result may occur with  improper specimen collection/handling, submission of specimen other than nasopharyngeal swab, presence of viral mutation(s) within the areas targeted by this assay, and inadequate number of viral copies(<138 copies/mL). A negative result must be combined with clinical observations, patient history, and epidemiological information. The expected result is Negative.  Fact Sheet for Patients:  BloggerCourse.com  Fact Sheet for Healthcare Providers:  SeriousBroker.it  This test is no t yet approved or cleared by the [place] FDA and  has been authorized for  detection and/or diagnosis of SARS-CoV-2 by FDA under an Emergency Use Authorization (EUA). This EUA will remain  in effect (meaning this test can be used) for the duration of the COVID-19 declaration under Section 564(b)(1) of the Act, 21 U.S.C.section 360bbb-3(b)(1), unless the authorization is terminated  or revoked sooner.       Influenza A by PCR NEGATIVE NEGATIVE Final   Influenza B by PCR NEGATIVE NEGATIVE Final    Comment: (NOTE) The Xpert Xpress SARS-CoV-2/FLU/RSV plus assay is intended as an aid in the diagnosis of influenza from Nasopharyngeal swab specimens and should not be used as a sole basis for treatment. Nasal washings and aspirates are unacceptable for Xpert Xpress SARS-CoV-2/FLU/RSV testing.  Fact Sheet for Patients: BloggerCourse.com  Fact Sheet for Healthcare Providers: SeriousBroker.it  This test is not yet approved or cleared by the [place] FDA and has been authorized for detection and/or diagnosis of SARS-CoV-2 by FDA under an Emergency Use Authorization (EUA). This EUA will remain in effect (meaning this test can be used) for the duration of the COVID-19 declaration under Section 564(b)(1) of the Act, 21 U.S.C. section 360bbb-3(b)(1), unless the authorization is terminated or revoked.     Resp Syncytial Virus by PCR NEGATIVE NEGATIVE Final    Comment: (NOTE) Fact Sheet for Patients: BloggerCourse.com  Fact Sheet for Healthcare Providers: SeriousBroker.it  This test is not yet approved or cleared by the [place] FDA and has been authorized for detection and/or diagnosis of SARS-CoV-2 by FDA under an Emergency Use Authorization (EUA). This EUA will remain in effect (meaning this test can be used) for the duration of the COVID-19 declaration under Section 564(b)(1) of the Act, 21 U.S.C. section 360bbb-3(b)(1), unless the authorization is  terminated or revoked.  Performed at [facility], [address], [place], [place] [address]      Radiology Studies: CT Head Wo Contrast Result Date: 08/12/2023 CLINICAL DATA:  Altered mental status. EXAM: CT HEAD WITHOUT CONTRAST TECHNIQUE: Contiguous axial images were obtained from the base of the skull through the vertex without intravenous contrast. RADIATION DOSE REDUCTION: This exam was performed according to the departmental dose-optimization program which includes automated exposure control, adjustment of the mA and/or kV according to patient size and/or use of iterative reconstruction technique. COMPARISON:  CT head dated 06/29/2023. FINDINGS: Streak artifact from a left-sided cochlear implant and motion artifact limit the sensitivity of the exam. Brain: A moderate volume area of hypoattenuation of the right occipital lobe  in the posterior cerebral artery territory is favored to reflect chronic infarct. This is new or progressed since 06/29/2023. No evidence of acute hemorrhage, hydrocephalus, extra-axial collection or mass lesion/mass effect. There is mild cerebral volume loss with associated ex vacuo dilatation. Periventricular white matter hypoattenuation likely represents chronic small vessel ischemic disease. 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 Vascular: There are vascular calcifications in the carotid siphons. Skull: Status post left mastoidectomy and cochlear implant. Negative for fracture or focal lesion. Sinuses/Orbits: There is right frontal, ethmoid, and maxillary sinus disease. Other: None. IMPRESSION: 1. Moderate volume area of hypoattenuation of the right occipital lobe in the posterior cerebral artery territory is favored to reflect chronic infarct. This is new or progressed since 06/29/2023. 2. Right frontal, ethmoid, and maxillary sinus disease. Electronically Signed   By: Romona Curls M.D.   On: 08/12/2023 15:11   DG  Chest Port 1 View Result Date: 08/12/2023 CLINICAL DATA:  Questionable sepsis - evaluate for abnormality EXAM: PORTABLE CHEST 1 VIEW COMPARISON:  None Available. FINDINGS: The cardiomediastinal silhouette is mildly enlarged in contour.Atherosclerotic calcifications. No pleural effusion. No pneumothorax. No acute pleuroparenchymal abnormality. Leftward deviation of the superior trachea. IMPRESSION: 1. No acute cardiopulmonary abnormality. 2. Leftward deviation of the superior trachea. This could reflect a thyroid goiter. This could be further assessed with dedicated nonemergent outpatient thyroid ultrasound. Electronically Signed   By: Meda Klinefelter M.D.   On: 08/12/2023 14:49    Scheduled Meds:  bisacodyl  10 mg Oral QHS   carbidopa-levodopa  2 tablet Oral TID   cyanocobalamin  1,000 mcg Oral Daily   doxazosin  1 mg Oral Daily   enoxaparin (LOVENOX) injection  40 mg Subcutaneous Q24H   escitalopram  5 mg Oral Daily   latanoprost  1 drop Both Eyes QHS   polyethylene glycol  17 g Oral BID   QUEtiapine  100 mg Oral TID   traZODone  150 mg Oral QHS   trimethoprim-polymyxin b  2 drop Both Eyes Q6H   Continuous Infusions:  aztreonam Stopped (08/13/23 0723)   lactated ringers 125 mL/hr at 08/13/23 0300   metronidazole 500 mg (08/13/23 1021)     LOS: 0 days    Total time spent coordinating care:   Debarah Crape, DO [facility]  To contact the attending physician between 7A-7P please use Epic Chat. To contact the covering physician during after hours 7P-7A, please review Amion.   08/13/2023, 10:44 AM   *This document has been created with the assistance of dictation software. Please excuse typographical errors. * \

## 2023-08-13 NOTE — Assessment & Plan Note (Signed)
On Examination patient with bilateral erythem bulbar and palpebral conjunctiva with thick creamy exudate.  Plan Polytrim 2qtts OU qid

## 2023-08-13 NOTE — ED Notes (Signed)
Warm compress applied to both eyes for 5 minutes as per order.

## 2023-08-13 NOTE — Subjective & Objective (Signed)
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 He was recently hospitalized for toxic metabolic encephalopathy  1/2-08/03/23. His course was complicated by urinary retention. He has been non-verbal in the past. He presents to [facility]ED for worsening mental status, fever and hypoxemia.

## 2023-08-13 NOTE — Assessment & Plan Note (Signed)
Patient with known left sensory-neural hearing loss. He did have a cochlear implant but by report this came out.  Plan Will need special assistance with communication.

## 2023-08-13 NOTE — ED Notes (Signed)
Warm compresses applied to bilateral eyes for 5 minutes. Oral care performed - mouth swabbed with pink swabs & mouth moisturizer applied.

## 2023-08-13 NOTE — ED Notes (Addendum)
PO meds not given. Pt responsive only to painful stimuli and not following commands. Debarah Crape, DO notified and alternative routes for medications requested.

## 2023-08-13 NOTE — ED Notes (Signed)
This RN and Chelsey, RN changed patient into new brief and new sheets. Bed locked and in lowest position, and call bell in reach of patient.

## 2023-08-14 DIAGNOSIS — A419 Sepsis, unspecified organism: Secondary | ICD-10-CM | POA: Diagnosis not present

## 2023-08-14 DIAGNOSIS — N390 Urinary tract infection, site not specified: Secondary | ICD-10-CM | POA: Diagnosis not present

## 2023-08-14 LAB — COMPREHENSIVE METABOLIC PANEL
ALT: 47 U/L — ABNORMAL HIGH (ref 0–44)
AST: 29 U/L (ref 15–41)
Albumin: 2.5 g/dL — ABNORMAL LOW (ref 3.5–5.0)
Alkaline Phosphatase: 54 U/L (ref 38–126)
Anion gap: 10 (ref 5–15)
BUN: 26 mg/dL — ABNORMAL HIGH (ref 8–23)
CO2: 26 mmol/L (ref 22–32)
Calcium: 8.4 mg/dL — ABNORMAL LOW (ref 8.9–10.3)
Chloride: 122 mmol/L — ABNORMAL HIGH (ref 98–111)
Creatinine, Ser: 0.57 mg/dL — ABNORMAL LOW (ref 0.61–1.24)
GFR, Estimated: >60 mL/min (ref >60)
Glucose, Bld: 106 mg/dL — ABNORMAL HIGH (ref 70–99)
Potassium: 4.2 mmol/L (ref 3.5–5.1)
Sodium: 158 mmol/L — ABNORMAL HIGH (ref 135–145)
Total Bilirubin: 0.9 mg/dL (ref 0.0–1.2)
Total Protein: 5.3 g/dL — ABNORMAL LOW (ref 6.5–8.1)

## 2023-08-14 LAB — BASIC METABOLIC PANEL
Anion gap: 8 (ref 5–15)
BUN: 26 mg/dL — ABNORMAL HIGH (ref 8–23)
CO2: 27 mmol/L (ref 22–32)
Calcium: 8.2 mg/dL — ABNORMAL LOW (ref 8.9–10.3)
Chloride: 117 mmol/L — ABNORMAL HIGH (ref 98–111)
Creatinine, Ser: 0.44 mg/dL — ABNORMAL LOW (ref 0.61–1.24)
GFR, Estimated: >60 mL/min (ref >60)
Glucose, Bld: 129 mg/dL — ABNORMAL HIGH (ref 70–99)
Potassium: 2.9 mmol/L — ABNORMAL LOW (ref 3.5–5.1)
Sodium: 152 mmol/L — ABNORMAL HIGH (ref 135–145)

## 2023-08-14 LAB — CBC WITH DIFFERENTIAL/PLATELET
Abs Immature Granulocytes: 0.11 10*3/uL — ABNORMAL HIGH (ref 0.00–0.07)
Basophils Absolute: 0.1 10*3/uL (ref 0.0–0.1)
Basophils Relative: 1 %
Eosinophils Absolute: 0.1 10*3/uL (ref 0.0–0.5)
Eosinophils Relative: 0 %
HCT: 49.3 % (ref 39.0–52.0)
Hemoglobin: 14.7 g/dL (ref 13.0–17.0)
Immature Granulocytes: 1 %
Lymphocytes Relative: 8 %
Lymphs Abs: 1.6 10*3/uL (ref 0.7–4.0)
MCH: 29.6 pg (ref 26.0–34.0)
MCHC: 29.8 g/dL — ABNORMAL LOW (ref 30.0–36.0)
MCV: 99.2 fL (ref 80.0–100.0)
Monocytes Absolute: 1.7 10*3/uL — ABNORMAL HIGH (ref 0.1–1.0)
Monocytes Relative: 9 %
Neutro Abs: 15.8 10*3/uL — ABNORMAL HIGH (ref 1.7–7.7)
Neutrophils Relative %: 81 %
Platelets: 219 10*3/uL (ref 150–400)
RBC: 4.97 MIL/uL (ref 4.22–5.81)
RDW: 13.8 % (ref 11.5–15.5)
WBC: 19.3 10*3/uL — ABNORMAL HIGH (ref 4.0–10.5)
nRBC: 0 % (ref 0.0–0.2)

## 2023-08-14 LAB — MAGNESIUM: Magnesium: 2.4 mg/dL (ref 1.7–2.4)

## 2023-08-14 LAB — MRSA NEXT GEN BY PCR, NASAL: MRSA by PCR Next Gen: NOT DETECTED

## 2023-08-14 LAB — PHOSPHORUS: Phosphorus: 2.1 mg/dL — ABNORMAL LOW (ref 2.5–4.6)

## 2023-08-14 MED ORDER — POTASSIUM CL IN DEXTROSE 5% 20 MEQ/L IV SOLN
20.0000 meq | INTRAVENOUS | Status: AC
  Administered 2023-08-14 – 2023-08-15 (×2): 20 meq via INTRAVENOUS
  Filled 2023-08-14 (×2): qty 1000

## 2023-08-14 MED ORDER — DEXTROSE 5 % IV SOLN: INTRAVENOUS | Status: AC

## 2023-08-14 MED ORDER — LEVOFLOXACIN IN D5W 750 MG/150ML IV SOLN
750.0000 mg | INTRAVENOUS | Status: AC
  Administered 2023-08-14 – 2023-08-18 (×5): 750 mg via INTRAVENOUS
  Filled 2023-08-14 (×5): qty 150

## 2023-08-14 MED ORDER — SODIUM CHLORIDE 0.9 % IV SOLN
2.0000 g | Freq: Three times a day (TID) | INTRAVENOUS | Status: DC
  Filled 2023-08-14: qty 10

## 2023-08-14 MED ORDER — QUETIAPINE FUMARATE 25 MG PO TABS: 50.0000 mg | ORAL_TABLET | Freq: Two times a day (BID) | ORAL | Status: DC | PRN

## 2023-08-14 NOTE — Evaluation (Signed)
Clinical/Bedside Swallow Evaluation Patient Details  Name: Justin Bird MRN: [id number] Date of Birth: 07-26-1940  Today's Date: 08/14/2023 Time: SLP Start Time (ACUTE ONLY): 1050 SLP Stop Time (ACUTE ONLY): 1140 SLP Time Calculation (min) (ACUTE ONLY): 50 min  Past Medical History:  Past Medical History:  Diagnosis Date   Allergy    Hearing deficit    Parkinson's disease (HCC)    Past Surgical History:  Past Surgical History:  Procedure Laterality Date   COCHLEAR IMPLANT Left    PARTIAL COLECTOMY     HPI:  Justin Bird is an 83 y/o with h/o Parkinson's disease, depression, sensory neural hearing loss who had a cochlear implant which is now lost and Dementia - vascular +/- Parkinson's related. He was recently hospitalized for toxic metabolic encephalopathy  1/2-08/03/23. His course was complicated by urinary retention. Also noted were increased Meds for Agitation/behavior during the lengthy admission. Per chart notes, "patient had been doing well until family abruptly discontinued his Sinemet for unclear reasons, and he subsequently developed confusion/altered mental status for which he presented to ED. In the ED, MDs assessed Justin Bird d/t his increased Agitation indicating concern of "diagnosis to include early stage of Dementia, delirium, vitamin B12 deficiency and abnormal thyroid function. Patient has possibly missed some dose of Sinemet, which may have contributed partially due to withdrawal. Patient received IV Ativan, IV Haldol, IM Zyprexa in ED.". Justin Bird had a Caregiver caring for him in the home prior to recent admissions.     He presents to [facility]ED this admit for worsening mental status, fever and hypoxemia and dx'd w/ Sepsis.   CXR this admit: No acute cardiopulmonary abnormality.  2. Leftward deviation of the superior trachea. 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    Assessment / Plan / Recommendation  Clinical Impression   Justin Bird seen for BSE. Justin Bird lying in bed staring then making groaning/moaning  noises intermittently. He did not look to this SLP given verbal cue nor follow any commands; did not state name nor say any words. He was initially orally defensive to oral care. Baseline Dementia, Parkinson's Dis. MUCH Confusion and agitation noted w/ oral care, then po trial attempts. No Family present.  On [place] o2 2L; afebrile. WBC elevated.   Justin Bird appears to present w/ SEVERE oropharyngeal phase dysphagia in setting of declined Cognitive status; declined mental status. Justin Bird has Baseline Dementia; Parkinson's Dis per chart notes. ANY Cognitive decline can impact overall awareness/timing of swallow and safety during po tasks which increases risk for aspiration, choking. Justin Bird is at HIGH risk for aspiration/aspiration pneumonia w/ oral intake at this time. Justin Bird required MAX cues to attempt follow through w/ tasks which were unsuccessful most often despite the cues. Justin Bird could not engage in po trials d/t poor to no oral awareness when placed at lips/mouth.        Justin Bird was given attempts at oral care then small tsp trials to encourage oral awareness and swallowing. Justin Bird exhibited agitation w/ oral care often biting on the swab sticks; noted dried skin/oral residue in anterior mouth(attempted to remove). W/ small tsp trials of single ice chip and Nectar liquid placed anteriorly in mouth or just at lips, significant oral phase deficits and Confusion noted c/b decreased labial seal(exaggerated mouth-open posturing), bolus awareness and slow lingual movements for bolus acceptance/management for A-P transfer/swallowing. During the pharyngeal swallow/phase, no pharyngeal swallowing appreciated w/ the minimal amount of po residue. No overt coughing occurred. Only few trials were attempted d/t Justin Bird's behavior w/ trials.  No further  po trials given d/t HIGH risk for aspiration.  OM Exam was cursory but appeared to reveal no unilateral weakness. Open-mouth posture at rest. Confusion and tonic biting occurred during oral care attempts  despite cues/support.    In setting of Justin Bird's presentation at this time, recommend strict NPO status w/ frequent oral care for hygiene and stimulation of swallowing. Aspiration precautions. ST services will f/u w/ ongoing assessment of swallowing in attempts to establish a least restrictive po diet.   NSG/MD updated on Justin Bird's status and presentation; POC.  SLP Visit Diagnosis: Dysphagia, oropharyngeal phase (R13.12) (Cognitive decline)    Aspiration Risk  Severe aspiration risk;Risk for inadequate nutrition/hydration    Diet Recommendation   NPO = oral care  Medication Administration: Via alternative means    Other  Recommendations Recommended Consults:  (Palliative Care for GOC) Oral Care Recommendations: Staff/trained caregiver to provide oral care;Oral care QID Caregiver Recommendations:  (tbd)    Recommendations for follow up therapy are one component of a multi-disciplinary discharge planning process, led by the attending physician.  Recommendations may be updated based on patient status, additional functional criteria and insurance authorization.  Follow up Recommendations Follow physician's recommendations for discharge plan and follow up therapies      Assistance Recommended at Discharge    Functional Status Assessment Patient has had a recent decline in their functional status and/or demonstrates limited ability to make significant improvements in function in a reasonable and predictable amount of time  Frequency and Duration min 2x/week  2 weeks       Prognosis Prognosis for improved oropharyngeal function: Guarded Barriers to Reach Goals: Cognitive deficits;Language deficits;Time post onset;Severity of deficits;Behavior;Medication Barriers/Prognosis Comment: Parkinson's Dis; Cognitive decline; Medications      Swallow Study   General Date of Onset: 08/12/23 HPI: Justin Bird is an 83 y/o with h/o Parkinson's disease, depression, sensory neural hearing loss who had a cochlear  implant which is now lost and Dementia - vascular +/- Parkinson's related. He was recently hospitalized for toxic metabolic encephalopathy  1/2-08/03/23. His course was complicated by urinary retention. Also noted were increased Meds for Agitation/behavior during the lengthy admission. Per chart notes, "patient had been doing well until family abruptly discontinued his Sinemet for unclear reasons, and he subsequently developed confusion/altered mental status for which he presented to ED. In the ED, MDs assessed Justin Bird d/t his increased Agitation indicating concern of "diagnosis to include early stage of Dementia, delirium, vitamin B12 deficiency and abnormal thyroid function. Patient has possibly missed some dose of Sinemet, which may have contributed partially due to withdrawal. Patient received IV Ativan, IV Haldol, IM Zyprexa in ED.". Justin Bird had a Caregiver caring for him in the home prior to recent admissions.     He presents to [facility]ED this admit for worsening mental status, fever and hypoxemia and dx'd w/ Sepsis.   CXR this admit: No acute cardiopulmonary abnormality.  2. Leftward deviation of the superior trachea. 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 Type of Study: Bedside Swallow Evaluation Previous Swallow Assessment: 08/02/23 Diet Prior to this Study: NPO (minced foods, thins prior) Temperature Spikes Noted: No (wbc 19.3) Respiratory Status: Nasal cannula (2L) History of Recent Intubation: No Behavior/Cognition: Alert;Confused;Distractible;Requires cueing;Doesn't follow directions (groan/moan for communication intermittently) Oral Cavity Assessment: Dry Oral Care Completed by SLP: Yes (attempted) Oral Cavity - Dentition: Adequate natural dentition Vision:  (n/a) Self-Feeding Abilities: Total assist Patient Positioning: Upright in bed (MAX assist) Baseline Vocal Quality: Low vocal intensity (phonations) Volitional Cough: Cognitively unable to elicit Volitional  Swallow: Unable to elicit     Oral/Motor/Sensory Function Overall Oral Motor/Sensory Function: Generalized oral weakness (no overt unilateral oral weakness)   Ice Chips Ice chips: Impaired Presentation: Spoon (fed; 4 trials) Oral Phase Impairments: Reduced labial seal;Reduced lingual movement/coordination;Poor awareness of bolus;Impaired mastication Oral Phase Functional Implications: Prolonged oral transit;Oral holding (spillage) Pharyngeal Phase Impairments:  (no pharyngeal swallow appreciated)   Thin Liquid Thin Liquid: Not tested    Nectar Thick Nectar Thick Liquid: Impaired Presentation: Spoon (2 trials at lips) Oral Phase Impairments: Reduced labial seal;Reduced lingual movement/coordination;Poor awareness of bolus Oral phase functional implications: Oral holding (leakage) Pharyngeal Phase Impairments:  (no pharyngeal swallow appreciated) Other Comments: removed via swab - attempted   Honey Thick Honey Thick Liquid: Not tested   Puree Puree: Not tested   Solid     Solid: Not tested        Jerilynn Som, MS, CCC-SLP Speech Language Pathologist Rehab Services; [facility] [place] [phone] (ascom) Keshayla Schrum 08/14/2023,2:28 PM

## 2023-08-14 NOTE — Progress Notes (Signed)
Pt non-verbal and A&O x 0. Unable to do admission questions.

## 2023-08-14 NOTE — Plan of Care (Signed)
   Problem: Health Behavior/Discharge Planning: Goal: Ability to manage health-related needs will improve Outcome: Progressing

## 2023-08-14 NOTE — Progress Notes (Addendum)
Progress Note   Patient: Justin Bird ZOX:[id number] DOB: 04/27/1941 DOA: 08/12/2023     1 DOS: the patient was seen and examined on 08/14/2023   Brief hospital course: Justin Bird is 83 y.o. 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  Recent hospital course was complicated by urinary retention.  He has been nonverbal in the past.  He presents on this admission with worsening mental status, fever, hypoxemia On arrival to the ED Tmax 101, BP 87/45 which improved with fluid resuscitation.  Labs reveal mild lactic acidosis, hypernatremia 158, chloride 120, glucose 138, WBC 15.4, hemoglobin 17.6.  CT head reveals chronic occipital infarct as well as right frontal, ethmoid and maxillary sinus disease.  He received 2 L LR, 500 cc NS, aztreonam, vancomycin, Flagyl.  TRH admitted the patient for sepsis.     Assessment and Plan:  Principal Problem:   Sepsis secondary to UTI [facility]) Active Problems:   Acute metabolic encephalopathy   SNHL (sensory-neural hearing loss), asymmetrical   Conjunctivitis   Parkinson's disease (HCC)         Sepsis - Criteria met on arrival with: Fever 101, hypotensive, altered mental status, leukocytosis -- Presumed urinary source, CXR showed no acute cardiopulmonary disease - Status post fluid resuscitation.  Blood pressure has improved - Continue azteonam and Flagyl.  Reportedly patient had erythema on his upper body with vancomycin since this has since been discontinued. - MRSA PCR is negative -- Urine culture result is pending - PT/OT when he recovers    Acute metabolic encephalopathy - Likely has baseline dementia exacerbated by hearing loss and sepsis - Patient remains lethargic and has been evaluated by speech therapy --Keep n.p.o. for now until mental status improves    Conjunctivitis - Also has purulent discharge bilaterally - Continue with Polytrim OU 4 times daily   Sensorineural hearing loss - Chronic, has had prior cochlear implant but this has since been lost. - Needs assistance with communication   Parkinson's disease - Continue home meds for now - Neurology was consulted, but this requires outpatient medication management and slow titration.  Will need close outpatient follow-up.   Hypernatremia - Secondary to dehydration --Administer free water -- Continue D5W   Unclear baseline - Patient recently had prolonged hospital stay for sepsis, and his son reports during that time he was heavily sedated.  He comes to [place] now from [facility] where he is on 100 mg Seroquel 3 times daily and trazodone 150 mg.   Trazodone has been discontinued and  Seroquel has been changed to as needed since patient is sedated    Poor prognosis - Patient's age, comorbidities, and recent hospitalization for toxic metabolic encephalopathy with repeat admission not long after discharge is concerning that patient is nearing end-of-life. - Palliative care consult         Subjective: Lethargic and tries to withdraw from painful stimuli  Physical Exam: Vitals:   08/14/23 0350 08/14/23 0543 08/14/23 0746 08/14/23 0912  BP: (!) 124/56 (!) 112/54 (!) 104/58 105/61  Pulse: 84 86 84   Resp: 18 19 18    Temp: 98.9 F (37.2 C) 98.9 F (37.2 C) 98.8 F (37.1 C)   TempSrc: Axillary Axillary Axillary   SpO2: 98% 97% 96% 97%  Weight:      Height:       General exam: Lethargic.  Withdraws from painful stimuli Respiratory system: No  work of breathing, symmetric chest wall expansion, mouth open Cardiovascular system: S1 & S2 heard, RRR.  Gastrointestinal system: Abdomen is nondistended, soft and nontender.  Neuro: obtunded, does not interact or follow commands, grimace to sternal rub  Extremities: Symmetric, expected ROM Skin: No rashes, lesions Psychiatry:  cannot assess    Data Reviewed: Labs reviewed.  Sodium 158, white count 19.3 There are no new results to review at this time.  Family Communication: Plan of care discussed with patient's daughter over the phone.  All questions and concerns have been addressed.  She verbalizes understanding and agrees with the plan  Disposition: Status is: Inpatient Remains inpatient appropriate because: On IV antibiotics  Planned Discharge Destination: Skilled nursing facility    Time spent: 40 minutes  Author: Lucile Shutters, MD 08/14/2023 2:05 PM  For on call review [email or web address].

## 2023-08-15 ENCOUNTER — Inpatient Hospital Stay: Payer: Medicare Other

## 2023-08-15 DIAGNOSIS — E440 Moderate protein-calorie malnutrition: Secondary | ICD-10-CM | POA: Insufficient documentation

## 2023-08-15 DIAGNOSIS — I639 Cerebral infarction, unspecified: Secondary | ICD-10-CM

## 2023-08-15 DIAGNOSIS — N390 Urinary tract infection, site not specified: Secondary | ICD-10-CM | POA: Diagnosis not present

## 2023-08-15 DIAGNOSIS — G20A1 Parkinson's disease without dyskinesia, without mention of fluctuations: Secondary | ICD-10-CM | POA: Diagnosis not present

## 2023-08-15 DIAGNOSIS — A419 Sepsis, unspecified organism: Secondary | ICD-10-CM | POA: Diagnosis not present

## 2023-08-15 LAB — COMPREHENSIVE METABOLIC PANEL
ALT: 39 U/L (ref 0–44)
AST: 23 U/L (ref 15–41)
Albumin: 2.2 g/dL — ABNORMAL LOW (ref 3.5–5.0)
Alkaline Phosphatase: 50 U/L (ref 38–126)
Anion gap: 8 (ref 5–15)
BUN: 24 mg/dL — ABNORMAL HIGH (ref 8–23)
CO2: 27 mmol/L (ref 22–32)
Calcium: 8.1 mg/dL — ABNORMAL LOW (ref 8.9–10.3)
Chloride: 119 mmol/L — ABNORMAL HIGH (ref 98–111)
Creatinine, Ser: 0.43 mg/dL — ABNORMAL LOW (ref 0.61–1.24)
GFR, Estimated: >60 mL/min (ref >60)
Glucose, Bld: 109 mg/dL — ABNORMAL HIGH (ref 70–99)
Potassium: 3 mmol/L — ABNORMAL LOW (ref 3.5–5.1)
Sodium: 154 mmol/L — ABNORMAL HIGH (ref 135–145)
Total Bilirubin: 0.8 mg/dL (ref 0.0–1.2)
Total Protein: 5 g/dL — ABNORMAL LOW (ref 6.5–8.1)

## 2023-08-15 LAB — PHOSPHORUS
Phosphorus: 1.7 mg/dL — ABNORMAL LOW (ref 2.5–4.6)
Phosphorus: 1.9 mg/dL — ABNORMAL LOW (ref 2.5–4.6)

## 2023-08-15 LAB — CBC WITH DIFFERENTIAL/PLATELET
Abs Immature Granulocytes: 0.1 10*3/uL — ABNORMAL HIGH (ref 0.00–0.07)
Basophils Absolute: 0 10*3/uL (ref 0.0–0.1)
Basophils Relative: 0 %
Eosinophils Absolute: 0.2 10*3/uL (ref 0.0–0.5)
Eosinophils Relative: 2 %
HCT: 43.5 % (ref 39.0–52.0)
Hemoglobin: 13.4 g/dL (ref 13.0–17.0)
Immature Granulocytes: 1 %
Lymphocytes Relative: 9 %
Lymphs Abs: 1.3 10*3/uL (ref 0.7–4.0)
MCH: 29.5 pg (ref 26.0–34.0)
MCHC: 30.8 g/dL (ref 30.0–36.0)
MCV: 95.8 fL (ref 80.0–100.0)
Monocytes Absolute: 0.8 10*3/uL (ref 0.1–1.0)
Monocytes Relative: 6 %
Neutro Abs: 11.8 10*3/uL — ABNORMAL HIGH (ref 1.7–7.7)
Neutrophils Relative %: 82 %
Platelets: 212 10*3/uL (ref 150–400)
RBC: 4.54 MIL/uL (ref 4.22–5.81)
RDW: 13.8 % (ref 11.5–15.5)
WBC: 14.2 10*3/uL — ABNORMAL HIGH (ref 4.0–10.5)
nRBC: 0 % (ref 0.0–0.2)

## 2023-08-15 LAB — GLUCOSE, CAPILLARY: Glucose-Capillary: 122 mg/dL — ABNORMAL HIGH (ref 70–99)

## 2023-08-15 LAB — MAGNESIUM
Magnesium: 2.2 mg/dL (ref 1.7–2.4)
Magnesium: 2.3 mg/dL (ref 1.7–2.4)

## 2023-08-15 MED ORDER — THIAMINE MONONITRATE 100 MG PO TABS
100.0000 mg | ORAL_TABLET | Freq: Every day | ORAL | Status: DC
  Administered 2023-08-16 – 2023-08-19 (×4): 100 mg
  Filled 2023-08-15 (×4): qty 1

## 2023-08-15 MED ORDER — FREE WATER
30.0000 mL | Status: DC
  Administered 2023-08-16 – 2023-08-23 (×45): 30 mL

## 2023-08-15 MED ORDER — VITAL HIGH PROTEIN PO LIQD: 1000.0000 mL | ORAL | Status: DC

## 2023-08-15 MED ORDER — K PHOS MONO-SOD PHOS DI & MONO 155-852-130 MG PO TABS: 500.0000 mg | ORAL_TABLET | ORAL | Status: AC

## 2023-08-15 MED ORDER — ADULT MULTIVITAMIN W/MINERALS CH
1.0000 | ORAL_TABLET | Freq: Every day | ORAL | Status: DC
  Administered 2023-08-16: 1
  Filled 2023-08-15 (×2): qty 1

## 2023-08-15 MED ORDER — OSMOLITE 1.5 CAL PO LIQD
1000.0000 mL | ORAL | Status: DC
  Administered 2023-08-16 – 2023-08-22 (×4): 1000 mL

## 2023-08-15 MED ORDER — PROSOURCE TF20 ENFIT COMPATIBL EN LIQD
60.0000 mL | Freq: Every day | ENTERAL | Status: DC
  Administered 2023-08-16 – 2023-08-24 (×8): 60 mL
  Filled 2023-08-15: qty 60

## 2023-08-15 MED ORDER — IOHEXOL 350 MG/ML SOLN
75.0000 mL | Freq: Once | INTRAVENOUS | Status: AC | PRN
  Administered 2023-08-15: 75 mL via INTRAVENOUS

## 2023-08-15 MED ORDER — POTASSIUM CHLORIDE 20 MEQ PO PACK: 40.0000 meq | PACK | Freq: Once | ORAL | Status: DC

## 2023-08-15 MED ORDER — ASPIRIN 81 MG PO TBEC: 81.0000 mg | DELAYED_RELEASE_TABLET | Freq: Every day | ORAL | Status: DC

## 2023-08-15 MED ORDER — CARBIDOPA-LEVODOPA 25-100 MG PO TABS
2.0000 | ORAL_TABLET | Freq: Three times a day (TID) | ORAL | Status: DC
  Administered 2023-08-16 – 2023-08-19 (×12): 2 via NASOGASTRIC
  Filled 2023-08-15 (×12): qty 2

## 2023-08-15 NOTE — Progress Notes (Signed)
Called and updated patient's daughter [name] about the recommendations from neurologist which include discontinuing Seroquel, NG tube to administer Sinemet as well as stroke work up. All questions and concerns have been addressed. She verbalizes understanding and agrees with the plan.

## 2023-08-15 NOTE — Consult Note (Addendum)
PHARMACY CONSULT NOTE - FOLLOW UP  Pharmacy Consult for Electrolyte Monitoring and Replacement   Recent Labs: Potassium (mmol/L)  Date Value  08/15/2023 3.0 (L)   Magnesium (mg/dL)  Date Value  16/03/9603 2.2   Calcium (mg/dL)  Date Value  54/02/8118 8.1 (L)   Albumin (g/dL)  Date Value  14/78/2956 2.2 (L)   Phosphorus (mg/dL)  Date Value  21/30/8657 1.7 (L)   Sodium (mmol/L)  Date Value  08/15/2023 154 (H)     Assessment: 83 y.o. 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  Recent hospital course was complicated by urinary retention.  He has been nonverbal since his last hospitalization.  He presents on this admission with worsening mental status, fever, hypoxemia   Currently on D5 with Kcl @ 50 ml/hr.  Diet: prosource TF20 60 ml daily, and osmolite 1.5 cal 50 ml/hr.  Free water 30 ml q4H   Goal of Therapy:  WNL  Plan:  Kcl 40 mEq per tube x 1 Kphos 2 tablets x 2.  F/u with AM labs.   Ronnald Ramp ,PharmD Clinical Pharmacist 08/15/2023 5:48 PM

## 2023-08-15 NOTE — Progress Notes (Signed)
Speech Language Pathology Treatment: Dysphagia  Patient Details Name: Karlon Schlafer MRN: [id number] DOB: Jun 12, 1941 Today's Date: 08/15/2023 Time: 4098-1191 SLP Time Calculation (min) (ACUTE ONLY): 40 min  Assessment / Plan / Recommendation Clinical Impression  Pt seen for ongoing assessment of swallowing and trials to upgrade to an oral diet if appropriate today. Pt lying in bed w/ eyes closed but opened/alerted w/ verbal/tactile/visual stim. Groaning/moaning phonations intermittently but moreso at end of session. Noted mildly congested breathing at Baseline w/ congested cough but no expectoration of Phlegm -- pt could not follow any commands to hock/spit. He was nonverbal; did not state name. He was initially orally defensive to oral care. Baseline Dementia, Parkinson's Dis. Confusion but less agitation noted w/ oral care. No Family present.  On [place] o2 2L; afebrile. WBC elevated but trending down.   Pt appears to present w/ SEVERE oropharyngeal phase dysphagia in setting of declined Cognitive status; declined mental status. Pt has Baseline Dementia; Parkinson's Dis per chart notes. ANY Cognitive decline can impact overall awareness/timing of swallow and safety during po tasks which increases risk for aspiration, choking. Pt is at HIGH risk for aspiration/aspiration pneumonia w/ oral intake at this time. Pt required MAX cues to attempt to follow through w/ tasks which were unsuccessful most often despite the cues. Pt could not engage in po trials d/t poor to no oral awareness/bolus acceptance when boluses were placed at lips/mouth.        Pt was given attempts at oral care then small tsp trials of ice chips and Purees to encourage oral awareness and swallowing. Pt exhibited less agitation today w/ oral care often w/ an open-mouth posture and less biting on swab. W/ small tsp trials of single ice chip and Puree placed anteriorly in mouth or just at lips, significant oral phase deficits and  Confusion noted c/b decreased labial seal(exaggerated mouth-open posturing), no bolus awareness and minimal lingual movements for bolus acceptance/management for A-P transfer/swallowing. Unsure of any pharyngeal swallowing; none appreciated w/ the minimal amount of po residue. No overt coughing occurred. Only few trials were attempted d/t pt's response/behavior w/ trials.  No further po trials given d/t HIGH risk for aspiration.     In setting of pt's presentation at this time, recommend strict NPO status w/ frequent oral care for hygiene and stimulation of swallowing. Aspiration precautions. ST services will f/u w/ ongoing assessment of swallowing in attempts to establish a least restrictive po diet.   NSG/MD updated on pt's status and presentation; POC. Recommend Palliative Care f/u for GOC, support.     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 He was recently hospitalized for toxic metabolic encephalopathy  1/2-08/03/23. His course was complicated by urinary retention. Also noted were increased Meds for Agitation/behavior during the lengthy admission. Per chart notes, "patient had been doing well until family abruptly discontinued his Sinemet for unclear reasons, and he subsequently developed confusion/altered mental status for which he presented to ED. In the ED, MDs assessed pt d/t his increased Agitation indicating concern of "diagnosis to include early stage of Dementia, delirium, vitamin B12 deficiency and abnormal thyroid function. Patient has possibly missed some dose of Sinemet, which may have contributed partially due to withdrawal. Patient received IV Ativan, IV Haldol, IM Zyprexa in ED.". Pt had a Caregiver caring for him in the home prior to recent admissions.  He presents to [facility]-ED this admit for worsening mental status, fever and hypoxemia and  dx'd w/ Sepsis.   CXR this admit: No acute cardiopulmonary abnormality.  2. Leftward deviation of the superior trachea. 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      SLP Plan  Continue with current plan of care      Recommendations for follow up therapy are one component of a multi-disciplinary discharge planning process, led by the attending physician.  Recommendations may be updated based on patient status, additional functional criteria and insurance authorization.    Recommendations  Diet recommendations: NPO Medication Administration: Via alternative means                 (Palliative Care for GOC, support) Oral care QID;Staff/trained caregiver to provide oral care   Frequent or constant Supervision/Assistance Dysphagia, oropharyngeal phase (R13.12) (Cognitive decline; baseline Dementia)     Continue with current plan of care       Jerilynn Som, MS, CCC-SLP Speech Language Pathologist Rehab Services; [facility] [phone] (ascom) Breionna Punt  08/15/2023, 4:50 PM

## 2023-08-15 NOTE — Consult Note (Signed)
NEUROLOGY CONSULT NOTE   Date of service: August 15, 2023 Patient Name: Justin Bird MRN:  [id number] DOB:  [birth year] Chief Complaint:  New right occipital lobe stroke seen on CT Requesting Provider: Lucile Shutters, MD  History of Present Illness  Justin Bird is a 83 y.o. male with a PMHx of Parkinson's disease, depression, sensory neural hearing loss. 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 He was diagnosed with sepsis secondary to UTI, in conjunction with acute encephalopath. CT head was obtained which revealed a right occipital lobe ischemic infarction of moderate size, which was new in comparison to his January CT head. Family is concerned that he may have had this stroke during his last hospitalization as he is now aphasic which had initially occurred during his last hospitalization. He was on Seroquel for agitation and family thinks this may have resulted in the stroke. Neurology has been consulted for stroke work up.   Additional history has been obtained from the HPI documented in his 06/29/23 prior admission note: "[At his normal baseline], patient is oriented to person and place, usually is confused about the time. In the past 4 days, patient has been confused. Patient is agitated, yelling around the ED. He moves all extremities, kicking around. No active nausea, vomiting, diarrhea noted. No fever. No respiratory distress, active cough noted. Does not seem to have chest pain or abdominal pain. Patient possibly missed some dose of Sinemet."  ROS  Unable to ascertain due to aphasia.   Past History   Past Medical History:  Diagnosis Date   Allergy    Hearing deficit    Parkinson's disease [facility])     Past Surgical History:  Procedure  Laterality Date   COCHLEAR IMPLANT Left    PARTIAL COLECTOMY      Family History: Family History  Problem Relation Age of Onset   Lung cancer Mother     Social History  reports that he has never smoked. He has never used smokeless tobacco. He reports that he does not currently use alcohol. He reports that he does not use drugs.  Allergies  Allergen Reactions   Opium Poppy [Papaver] Anaphylaxis and Hives   Penicillins Anaphylaxis and Rash   Sulfa Antibiotics Anaphylaxis   Penicillins    Sulfa Antibiotics     Medications   Current Facility-Administered Medications:    acetaminophen (TYLENOL) tablet 650 mg, 650 mg, Oral, Q6H PRN **OR** acetaminophen (TYLENOL) suppository 650 mg, 650 mg, Rectal, Q6H PRN, Jawo, Modou L, NP, 650 mg at 08/13/23 2052   bisacodyl (DULCOLAX) EC tablet 10 mg, 10 mg, Oral, QHS, Norins, Rosalyn Gess, MD   bisacodyl (DULCOLAX) suppository 10 mg, 10 mg, Rectal, Daily PRN, Norins, Rosalyn Gess, MD   carbidopa-levodopa (SINEMET IR) 25-100 MG per tablet immediate release 2 tablet, 2 tablet, Oral, TID, Norins, Rosalyn Gess, MD   cyanocobalamin (VITAMIN B12) tablet 1,000 mcg, 1,000 mcg, Oral, Daily, Norins, Rosalyn Gess, MD   dextrose 5 % with KCl 20 mEq / L  infusion, 20 mEq, Intravenous, Continuous, Agbata, Tochukwu, MD, Last Rate: 50 mL/hr at 08/15/23 0447, Infusion Verify at 08/15/23 0447   doxazosin (CARDURA) tablet 1 mg, 1 mg, Oral, Daily, Norins, Rosalyn Gess, MD   enoxaparin (LOVENOX) injection 40 mg, 40 mg, Subcutaneous, Q24H, Norins, Rosalyn Gess, MD, 40 mg at 08/15/23 1227  escitalopram (LEXAPRO) tablet 5 mg, 5 mg, Oral, Daily, Norins, Rosalyn Gess, MD   hydrALAZINE (APRESOLINE) injection 10 mg, 10 mg, Intravenous, Q6H PRN, Dezii, Alexandra, DO   latanoprost (XALATAN) 0.005 % ophthalmic solution 1 drop, 1 drop, Both Eyes, QHS, Norins, Rosalyn Gess, MD, 1 drop at 08/14/23 2010   levofloxacin (LEVAQUIN) IVPB 750 mg, 750 mg, Intravenous, Q24H, Agbata, Tochukwu, MD, Stopped at  08/14/23 2006   polyethylene glycol (MIRALAX / GLYCOLAX) packet 17 g, 17 g, Oral, BID, Norins, Rosalyn Gess, MD   polyvinyl alcohol (LIQUIFILM TEARS) 1.4 % ophthalmic solution 2 drop, 2 drop, Both Eyes, PRN, Norins, Rosalyn Gess, MD   QUEtiapine (SEROQUEL) tablet 50 mg, 50 mg, Oral, BID PRN, Agbata, Tochukwu, MD   trimethoprim-polymyxin b (POLYTRIM) ophthalmic solution 2 drop, 2 drop, Both Eyes, Q6H, Norins, Rosalyn Gess, MD, 2 drop at 08/15/23 1232  Vitals   Vitals:   08/15/23 0000 08/15/23 0019 08/15/23 0418 08/15/23 0815  BP: (!) 102/56 110/80 (!) 120/54 120/65  Pulse: 78 83 83 82  Resp: 18 18 18 16   Temp: 99.4 F (37.4 C) 99.1 F (37.3 C) 99.4 F (37.4 C) 98.4 F (36.9 C)  TempSrc: Axillary   Oral  SpO2: 96% 94% 94% 92%  Weight:      Height:        Body mass index is 24.87 kg/m.  Physical Exam   Physical Exam HEENT- [place]/AT. Increased neck tone in flexion and rotation, but not with typical rigid tone of a meningitis.  Lungs- Respirations unlabored Extremities- Warm and well-perfused  Neurological Examination Mental Status: Awake but not responding to commands or other vocal input. Verbal output consists of low, guttural growling noises, during some of which he grimaces and appears to increase in agitation. At times he stops growling and will produce nonsensical yammering-like vocal output at a normal pitch. He keeps his gaze fixed slightly to the right of the midline and does not respond to visual stimuli, but will intermittently blink to threat. He did rotate his head and eyes slowly to the left of midline with sustained noxious stimulation on the left, but after a short interval reverts back to rightward head positioning with eyes again slightly to the right of midline.    Cranial Nerves: II: Blinks to threat in right hemifield, but not in left hemifield. Pupils round and equally reactive, right slightly larger than left.  III,IV, VI: No ptosis. Eyes are conjugate and slightly to the  right of midline. Doll's eye reflex is suppressed. Can cross midline to the left with noxious stimulus. V: Reacts to touch bilaterally VII: Mildly asymmetric grimace, but difficult to determine if this is due to weakness.  VIII: Does not respond to voice or loud clapping IX,X: Gag reflex deferred XI: Head rotated to the right of midline XII: Does not follow commands for testing Motor: RUE: Rigid tone, tends to keep fingers flexed in a fist-like position, elbow flexed and shoulder adducted. Increased tone is present in all joints. Will reflexively grip when pressure is applied to the palmar aspect of his hand.  LUE: Rigid tone, tends to keep fingers flexed in a fist-like position, elbow flexed and shoulder adducted. Increased tone is present in all joints. Will reflexively grip when pressure is applied to the palmar aspect of his hand.  He moves his RUE spontaneously and also to noxious, but movements are slow. When agitated, he briefly makes low-amplitude punching movements with his right arm.  Moves LUE significantly less than right to  noxious and also with fewer spontaneous movements.  RLE: Rigid tone. Does not follow commands for movement but withdraws to noxious; RLE moves less than left.  LLE: Rigid tone. Does not follow commands, but withdraws to noxious, moving more briskly than on the right.  Sensory: Responds to noxious in all 4 extremities. Deep Tendon Reflexes: Pathologically brisk, low amplitude reflexes throughout.  Plantars: Right: downgoingLeft: downgoing Cerebellar: Unable to assess Gait: Unable to assess   Labs/Imaging/Neurodiagnostic studies   CBC:  Recent Labs  Lab 2023/08/17 0434 08/15/23 0508  WBC 19.3* 14.2*  NEUTROABS 15.8* 11.8*  HGB 14.7 13.4  HCT 49.3 43.5  MCV 99.2 95.8  PLT 219 212   Basic Metabolic Panel:  Lab Results  Component Value Date   NA 154 (H) 08/15/2023   K 3.0 (L) 08/15/2023   CO2 27 08/15/2023   GLUCOSE 109 (H) 08/15/2023   BUN 24 (H)  08/15/2023   CREATININE 0.43 (L) 08/15/2023   CALCIUM 8.1 (L) 08/15/2023   GFRNONAA >60 08/15/2023     ASSESSMENT  Irl Bodie is a 83 y.o. male with recent onset of aphasia, who re-presented to the hospital with AMS in the setting of sepsis. Neurology has been consulted after a new moderate-sized right occipital lobe ischemic infarction was diagnosed by CT.  - Exam reveals an agitated elderly male with diffuse limb rigidity, brisk low amplitude reflexes and dense receptive and expressive aphasia. He blinks to threat on the right, but not on the left, consistent with the new right occipital lobe infarction seen on CT.  - CT head: A moderate volume area of hypoattenuation of the right occipital lobe in the posterior cerebral artery territory is favored to reflect chronic infarct; this is new or progressed since 06/29/2023. There is mild cerebral volume loss with associated ex vacuo dilatation. Periventricular white matter hypoattenuation likely represents chronic small vessel ischemic disease. 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 Streak artifact from a left-sided cochlear implant and motion artifact limit the sensitivity of the exam. - Labs: - WBC elevated at 14.2 - Hypernatremic with Na of 154 - Hypokalemic with K of 3 - BUN elevated at 24. Cr low at 0.43 - Ca 8.1, but albumin is also low - Mg normal.  - AST and ALT aer normal.  - TSH low at 0.308 - EKG: Sinus tachycardia, atrial premature complex, borderline prolonged QT interval - Impressions: - DDx for his AMS includes catatonia as an exacerbating factor on his underlying dementia. Seroquel could be contributing. His deranged electrolytes, with labs suggestive of volume depletion, may also be playing a role. Lack of auditory input given his deafness with reliance on a now non-functioning cochlear implant, could be contributing to a delirium. Sepsis may also have precipitated his worsening  and if that is the case, he should improve with ABX. 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  - The underlying etiology for his subacute right occipital lobe stroke most likely is either cardioembolic or atherothrombotic.   RECOMMENDATIONS  - Discontinue his Seroquel.  - Continue Sinemet. If necessary, place an NGT for administration. Abrupt withdrawal from Sinemet reduces dopaminergic signaling in the brain and as a result, can precipitate neuroleptic syndrome (as can addition or increased dosing of an antipsychotic). - Management of sepsis, dehydration and electrolyte abnormalities per primary team.  - Stroke work up and management given  his subacute right occipital lobe ischemic infarction: - MRI not likely to provide added benefit and he has hardware from left cochlear implant which likely is a contraindication.  - CTA of head and neck - TTE - Cardiac telemetry - Start ASA 81 mg po every day - Not likely to benefit from a statin given that the long term beneficial effects are most likely outweighed by short term risks given his advanced age - Hydrate well - BP management  - Glucose management - Avoid fever - PT/OT/Speech ______________________________________________________________________    Dessa Phi, Perl Folmar, MD [facility]

## 2023-08-15 NOTE — Progress Notes (Signed)
Initial Nutrition Assessment  DOCUMENTATION CODES:   Non-severe (moderate) malnutrition in context of chronic illness  INTERVENTION:   -Once NGT placement has been verified, recommend:  Initiate Osmolite 1.5 @ 20 ml/hr and increase by 10 ml every 8 hours to goal rate of 50 ml/hr.   60 ml Prosource TF daily    30 free water flush every 4 hours  Tube feeding regimen provides 1880 kcal (100% of needs), 95 grams of protein, and 914 ml of H2O. Total free water: 10964 ml daily  -Monitor Mg, K, and Phos and replete as needed secondary to high feeding risk -MVI with minerals daily via tube x 10 days -100 mg thiamine daily via tube x 7 days -Discussed concerns regarding electrolyte abnormalities with MD and RN; recommending pharmacy consult for electrolyte management  NUTRITION DIAGNOSIS:   Moderate Malnutrition related to chronic illness (Parkinson's) as evidenced by mild fat depletion, moderate fat depletion, percent weight loss, mild muscle depletion, moderate muscle depletion.  GOAL:   Patient will meet greater than or equal to 90% of their needs  MONITOR:   Diet advancement, TF tolerance  REASON FOR ASSESSMENT:   Consult Enteral/tube feeding initiation and management  ASSESSMENT:   Pt with medical history significant of hard of hearing, depression, Parkinson's disease, allergy, who presents with altered mental status.  Pt admitted with acute metabolic encephalopathy.   2/17- s/p BSE- NPO 2/18- s/p BSE- NPO  Reviewed I/O's: +2.3 L x 24 hours and +6.3 L since admission  UOP: 300 ml x 24 hours  Per H&P, at baseline pt is oriented to person and place, confused about time. Pt with increased confusion for 4 days PTA. CT of head negative for acute intracranial abnormalities.   Pt with newly diagnosed rt occipital lobe ischemic infraction; neurology consulted. Plan for stroke work-up.   Pt from [facility] PTA.   Pt lying in bed at time of visit. He did not  arouse to voice or touch. No family present.  Case discussed with SLP, RN, and MD. Pt remains NPO. Plan to place NGT to medications and feeding administration.   Reviewed wt hx; pt has experienced an 11% wt loss over the past 3 months, which is significant for time frame.   Palliative care consult pending for goals of care.   Medications reviewed and include sinemet, vitamin B-12, lovenox, miralax, and dextrose 5% with KCl 20 mEq/L infusion @ 50 ml/hr.   Labs reviewed: Na: 154, K: 3.0, Mg: 2.2, Phos: 1.7, CBGS: 96 (inpatient orders for glycemic control are none).    NUTRITION - FOCUSED PHYSICAL EXAM:  Flowsheet Row Most Recent Value  Orbital Region Mild depletion  Upper Arm Region Mild depletion  Thoracic and Lumbar Region No depletion  Buccal Region No depletion  Temple Region No depletion  Clavicle Bone Region No depletion  Clavicle and Acromion Bone Region No depletion  Scapular Bone Region No depletion  Dorsal Hand Mild depletion  Patellar Region Mild depletion  Anterior Thigh Region Mild depletion  Posterior Calf Region Mild depletion  Edema (RD Assessment) None  Hair Reviewed  Eyes Reviewed  Mouth Reviewed  Skin Reviewed  Nails Reviewed       Diet Order:   Diet Order             Diet NPO time specified  Diet effective now                   EDUCATION NEEDS:   Not appropriate for education  at this time  Skin:  Skin Assessment: Skin Integrity Issues: Skin Integrity Issues:: Other (Comment), Stage II Stage II: buttocks Other: skin tear to lt hand  Last BM:  08/14/23 (type 6)  Height:   Ht Readings from Last 1 Encounters:  08/13/23 5\' 5"  (1.651 m)    Weight:   Wt Readings from Last 1 Encounters:  08/13/23 67.8 kg    Ideal Body Weight:  61.8 kg  BMI:  Body mass index is 24.87 kg/m.  Estimated Nutritional Needs:   Kcal:  1850-2050  Protein:  90-105 grams  Fluid:  > 1.8 L    Levada Schilling, RD, LDN, CDCES Registered Dietitian  III Certified Diabetes Care and Education Specialist If unable to reach this RD, please use "RD Inpatient" group chat on secure chat between hours of 8am-4 pm daily

## 2023-08-15 NOTE — Progress Notes (Signed)
This RN acknowledged order to insert NG tube into patient. This RN requested assistance from [employer] given patient's compromised cognitive status and his inability to follow instructions for proper NG tube insertion. Patient was positioned with HOB90. Alona RN attempted to insert 18Fr NG tube into patient's right nare. Resistance was met, this RN attempted to angle NG tube differently and insert further but resistance was met again. Patient began coughing so NG tube was withdrawn and was streaked with blood. This was communicated with Dr. Joylene Igo. This RN was advised to obtain smaller NG tube and try again. A 14Fr NG tube was obtained and attempted to insert into patient's left nare. Tube passed easily to the 57cm mark. No gastric contents were returned and air injection was not audible. Stat chest xray was obtained to confirm placement which showed tube coiled in left bronchus. Tube was withdrawn and MD Agbata notified of above and that floor RNs would not attempt a 3rd time. Patient's vitals WNL and not in distress. This RN Dance movement psychotherapist of above.

## 2023-08-15 NOTE — Progress Notes (Signed)
Progress Note   Patient: Justin Bird ZOX:[id number] DOB: 05/20/1941 DOA: 08/12/2023     2 DOS: the patient was seen and examined on 08/15/2023   Brief hospital course: Justin Bird is 83 y.o. 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  Recent hospital course was complicated by urinary retention.  He has been nonverbal since his last hospitalization.  He presents on this admission with worsening mental status, fever, hypoxemia On arrival to the ED Tmax 101, BP 87/45 which improved with fluid resuscitation.  Labs reveal mild lactic acidosis, hypernatremia 158, chloride 120, glucose 138, WBC 15.4, hemoglobin 17.6.  CT head reveals chronic occipital infarct as well as right frontal, ethmoid and maxillary sinus disease.  He received 2 L LR, 500 cc NS, aztreonam, vancomycin, Flagyl.  TRH admitted the patient for sepsis.     Assessment and Plan:  Principal Problem:   Sepsis secondary to UTI [facility]) Active Problems:   Acute metabolic encephalopathy   SNHL (sensory-neural hearing loss), asymmetrical   Conjunctivitis   Parkinson's disease (HCC)         Sepsis - Criteria met on arrival with: Fever 101, hypotensive, altered mental status, leukocytosis -- Presumed urinary source, CXR showed no acute cardiopulmonary disease - Status post fluid resuscitation.  Blood pressure has improved -- Urine culture yields 40,000 colonies of gram-positive cocci --Continue empiric therapy with Levaquin.  Leukocytosis shows a downward trend. -- MRSA PCR is negative - PT/OT when he recovers      Acute metabolic encephalopathy Concern for possible CVA - Likely has baseline dementia exacerbated by hearing loss, hypernatremia and sepsis - Patient remains lethargic and has been evaluated by speech therapy --Keep n.p.o. for now until mental  status improves -- Administer free water -- Patient had a CT scan of the head on admission which showed moderate volume area of hypoattenuation of the right occipital lobe in the posterior cerebral artery territory is favored to reflect chronic infarct. This is new or progressed since 06/29/2023. Right frontal, ethmoid, and maxillary sinus disease. -- Discussed with patient's daughter, Justin Bird who is concerned that her father may have had a stroke during his last hospitalization since he left the hospital aphasic. The area of infarct should not result in aphasia. Will consult neurology.  Patient is unable to get an MRI of the brain since he has a cochlear implant -- NG tube for nutrition until patient's mental status improves   Conjunctivitis - Also has purulent discharge bilaterally - Continue with Polytrim OU 4 times daily    Sensorineural hearing loss - Chronic, has had prior cochlear implant but this has since been lost. - Needs assistance with communication   Parkinson's disease - Continue Sinemet - Neurology was consulted, but this requires outpatient medication management and slow titration.  Will need close outpatient follow-up.    Hypernatremia - Secondary to dehydration -- Continue free water administration with D5W -- NG tube to administer free water    Unclear baseline - Patient recently had prolonged hospital stay for sepsis, and his son reports during that time he was heavily sedated.  He comes to [place] now from [facility] where he is on 100 mg Seroquel 3 times daily and trazodone 150 mg.   Trazodone has been discontinued and  Seroquel has been changed to as needed since patient is sedated     Poor prognosis - Patient's age, comorbidities, and  recent hospitalization for toxic metabolic encephalopathy with repeat admission not long after discharge is concerning that patient is nearing end-of-life. - Palliative care consult           Subjective: More responsive  today.  Assessment is impaired since patient is hard of hearing  Physical Exam: Vitals:   08/15/23 0000 08/15/23 0019 08/15/23 0418 08/15/23 0815  BP: (!) 102/56 110/80 (!) 120/54 120/65  Pulse: 78 83 83 82  Resp: 18 18 18 16   Temp: 99.4 F (37.4 C) 99.1 F (37.3 C) 99.4 F (37.4 C) 98.4 F (36.9 C)  TempSrc: Axillary   Oral  SpO2: 96% 94% 94% 92%  Weight:      Height:       General exam: Lethargic.  Withdraws from painful stimuli Respiratory system: No work of breathing, symmetric chest wall expansion, mouth open Cardiovascular system: S1 & S2 heard, RRR.  Gastrointestinal system: Abdomen is nondistended, soft and nontender.  Neuro: obtunded, does not interact or follow commands, grimace to sternal rub  Extremities: Symmetric, expected ROM Skin: No rashes, lesions Psychiatry: cannot assess     Data Reviewed: Labs reviewed.  White count 14.2, sodium 154, potassium 3.0 There are no new results to review at this time.  Family Communication: Plan of care discussed with patient's daughter Justin Bird over the phone.  All questions and concerns have been addressed.  She verbalizes understanding and agrees with the plan.  Disposition: Status is: Inpatient Remains inpatient appropriate because: Evaluation of altered mental status  Planned Discharge Destination: Skilled nursing facility    Time spent: 40 minutes  Author: Lucile Shutters, MD 08/15/2023 2:03 PM  For on call review [email or web address].

## 2023-08-16 ENCOUNTER — Inpatient Hospital Stay: Payer: Medicare Other

## 2023-08-16 ENCOUNTER — Inpatient Hospital Stay (HOSPITAL_COMMUNITY)
Admit: 2023-08-16 | Discharge: 2023-08-16 | Disposition: A | Discharge status: Home / Self Care | Payer: Medicare Other | Attending: Internal Medicine | Admitting: Internal Medicine

## 2023-08-16 DIAGNOSIS — N390 Urinary tract infection, site not specified: Secondary | ICD-10-CM | POA: Diagnosis not present

## 2023-08-16 DIAGNOSIS — H1033 Unspecified acute conjunctivitis, bilateral: Secondary | ICD-10-CM | POA: Diagnosis not present

## 2023-08-16 DIAGNOSIS — Z515 Encounter for palliative care: Secondary | ICD-10-CM

## 2023-08-16 DIAGNOSIS — E440 Moderate protein-calorie malnutrition: Secondary | ICD-10-CM | POA: Diagnosis not present

## 2023-08-16 DIAGNOSIS — I6389 Other cerebral infarction: Secondary | ICD-10-CM

## 2023-08-16 DIAGNOSIS — A419 Sepsis, unspecified organism: Secondary | ICD-10-CM | POA: Diagnosis not present

## 2023-08-16 DIAGNOSIS — H903 Sensorineural hearing loss, bilateral: Secondary | ICD-10-CM

## 2023-08-16 DIAGNOSIS — G9341 Metabolic encephalopathy: Secondary | ICD-10-CM

## 2023-08-16 LAB — URINE CULTURE

## 2023-08-16 LAB — CBC WITH DIFFERENTIAL/PLATELET
Abs Immature Granulocytes: 0.07 10*3/uL (ref 0.00–0.07)
Basophils Absolute: 0 10*3/uL (ref 0.0–0.1)
Basophils Relative: 0 %
Eosinophils Absolute: 0.3 10*3/uL (ref 0.0–0.5)
Eosinophils Relative: 3 %
HCT: 42.3 % (ref 39.0–52.0)
Hemoglobin: 13.3 g/dL (ref 13.0–17.0)
Immature Granulocytes: 1 %
Lymphocytes Relative: 13 %
Lymphs Abs: 1.3 10*3/uL (ref 0.7–4.0)
MCH: 29.6 pg (ref 26.0–34.0)
MCHC: 31.4 g/dL (ref 30.0–36.0)
MCV: 94.2 fL (ref 80.0–100.0)
Monocytes Absolute: 0.6 10*3/uL (ref 0.1–1.0)
Monocytes Relative: 6 %
Neutro Abs: 7.6 10*3/uL (ref 1.7–7.7)
Neutrophils Relative %: 77 %
Platelets: 227 10*3/uL (ref 150–400)
RBC: 4.49 MIL/uL (ref 4.22–5.81)
RDW: 13.8 % (ref 11.5–15.5)
WBC: 9.8 10*3/uL (ref 4.0–10.5)
nRBC: 0 % (ref 0.0–0.2)

## 2023-08-16 LAB — ECHOCARDIOGRAM COMPLETE
AR max vel: 3.98 cm2
AV Area VTI: 4.33 cm2
AV Area mean vel: 4.03 cm2
AV Mean grad: 6.5 mm[Hg]
AV Peak grad: 11.2 mm[Hg]
Ao pk vel: 1.67 m/s
Area-P 1/2: 4.1 cm2
Height: 65 in
MV VTI: 5.04 cm2
S' Lateral: 2.3 cm
Weight: 2483.26 [oz_av]

## 2023-08-16 LAB — COMPREHENSIVE METABOLIC PANEL
ALT: 34 U/L (ref 0–44)
AST: 30 U/L (ref 15–41)
Albumin: 2.1 g/dL — ABNORMAL LOW (ref 3.5–5.0)
Alkaline Phosphatase: 49 U/L (ref 38–126)
Anion gap: 7 (ref 5–15)
BUN: 20 mg/dL (ref 8–23)
CO2: 24 mmol/L (ref 22–32)
Calcium: 8 mg/dL — ABNORMAL LOW (ref 8.9–10.3)
Chloride: 120 mmol/L — ABNORMAL HIGH (ref 98–111)
Creatinine, Ser: 0.39 mg/dL — ABNORMAL LOW (ref 0.61–1.24)
GFR, Estimated: >60 mL/min (ref >60)
Glucose, Bld: 92 mg/dL (ref 70–99)
Potassium: 3.2 mmol/L — ABNORMAL LOW (ref 3.5–5.1)
Sodium: 151 mmol/L — ABNORMAL HIGH (ref 135–145)
Total Bilirubin: 0.4 mg/dL (ref 0.0–1.2)
Total Protein: 5.1 g/dL — ABNORMAL LOW (ref 6.5–8.1)

## 2023-08-16 LAB — PROCALCITONIN: Procalcitonin: 0.11 ng/mL

## 2023-08-16 LAB — MAGNESIUM: Magnesium: 2.3 mg/dL (ref 1.7–2.4)

## 2023-08-16 LAB — GLUCOSE, CAPILLARY
Glucose-Capillary: 108 mg/dL — ABNORMAL HIGH (ref 70–99)
Glucose-Capillary: 132 mg/dL — ABNORMAL HIGH (ref 70–99)
Glucose-Capillary: 132 mg/dL — ABNORMAL HIGH (ref 70–99)

## 2023-08-16 LAB — POTASSIUM: Potassium: 3.3 mmol/L — ABNORMAL LOW (ref 3.5–5.1)

## 2023-08-16 LAB — PHOSPHORUS: Phosphorus: 2.5 mg/dL (ref 2.5–4.6)

## 2023-08-16 MED ORDER — VITAMIN B-12 1000 MCG PO TABS
1000.0000 ug | ORAL_TABLET | Freq: Every day | ORAL | Status: DC
  Administered 2023-08-17 – 2023-08-19 (×3): 1000 ug
  Filled 2023-08-16 (×3): qty 1

## 2023-08-16 MED ORDER — POTASSIUM CHLORIDE 10 MEQ/100ML IV SOLN
10.0000 meq | INTRAVENOUS | Status: AC
  Administered 2023-08-16 (×3): 10 meq via INTRAVENOUS
  Filled 2023-08-16 (×3): qty 100

## 2023-08-16 MED ORDER — ACETAMINOPHEN 650 MG RE SUPP: 650.0000 mg | Freq: Four times a day (QID) | RECTAL | Status: DC | PRN

## 2023-08-16 MED ORDER — ASPIRIN 81 MG PO CHEW
81.0000 mg | CHEWABLE_TABLET | Freq: Every day | ORAL | Status: DC
  Administered 2023-08-16 – 2023-08-19 (×4): 81 mg
  Filled 2023-08-16 (×4): qty 1

## 2023-08-16 MED ORDER — ACETAMINOPHEN 325 MG PO TABS: 650.0000 mg | ORAL_TABLET | Freq: Four times a day (QID) | ORAL | Status: DC | PRN

## 2023-08-16 MED ORDER — POLYETHYLENE GLYCOL 3350 17 G PO PACK
17.0000 g | PACK | Freq: Two times a day (BID) | ORAL | Status: DC
Start: 2023-08-16 — End: 2023-08-20
  Administered 2023-08-17 – 2023-08-19 (×5): 17 g
  Filled 2023-08-16 (×6): qty 1

## 2023-08-16 MED ORDER — BISACODYL 10 MG RE SUPP
10.0000 mg | Freq: Every day | RECTAL | Status: DC
  Administered 2023-08-16 – 2023-08-24 (×6): 10 mg via RECTAL
  Filled 2023-08-16 (×7): qty 1

## 2023-08-16 MED ORDER — DOXAZOSIN MESYLATE 1 MG PO TABS
1.0000 mg | ORAL_TABLET | Freq: Every day | ORAL | Status: DC
  Administered 2023-08-17 – 2023-08-19 (×3): 1 mg
  Filled 2023-08-16 (×3): qty 1

## 2023-08-16 MED ORDER — ESCITALOPRAM OXALATE 10 MG PO TABS
5.0000 mg | ORAL_TABLET | Freq: Every day | ORAL | Status: DC
Start: 2023-08-17 — End: 2023-08-20
  Administered 2023-08-17 – 2023-08-19 (×3): 5 mg
  Filled 2023-08-16 (×3): qty 1

## 2023-08-16 MED ORDER — DEXTROSE 5 % IV SOLN: INTRAVENOUS | Status: AC

## 2023-08-16 NOTE — Care Management Important Message (Signed)
Important Message  Patient Details  Name: Justin Bird MRN: [id number] Date of Birth: 1940/08/16   Important Message Given:  Yes - Medicare IM     Cristela Blue, CMA 08/16/2023, 10:22 AM

## 2023-08-16 NOTE — Progress Notes (Signed)
PROGRESS NOTE    Jerell Demery  XBJ:[id number] DOB: Jan 10, 1941 DOA: 08/12/2023 PCP: Pcp, No    Brief Narrative:    Vane Yapp is 83 y.o. 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  Recent hospital course was complicated by urinary retention.  He has been nonverbal since his last hospitalization.  He presents on this admission with worsening mental status, fever, hypoxemia On arrival to the ED Tmax 101, BP 87/45 which improved with fluid resuscitation.  Labs reveal mild lactic acidosis, hypernatremia 158, chloride 120, glucose 138, WBC 15.4, hemoglobin 17.6.  CT head reveals chronic occipital infarct as well as right frontal, ethmoid and maxillary sinus disease.  He received 2 L LR, 500 cc NS, aztreonam, vancomycin, Flagyl.  TRH admitted the patient for sepsis.    Assessment & Plan:   Principal Problem:   Sepsis secondary to UTI [facility]) Active Problems:   Acute metabolic encephalopathy   SNHL (sensory-neural hearing loss), asymmetrical   Conjunctivitis   Parkinson's disease (HCC)   Malnutrition of moderate degree  Sepsis - Criteria met on arrival with: Fever 101, hypotensive, altered mental status, leukocytosis -- Presumed urinary source, CXR showed no acute cardiopulmonary disease - Status post fluid resuscitation.  Blood pressure has improved -- Urine culture yields 40,000 colonies of gram-positive cocci, Aerococcus.  Unclear whether this represents true infection Plan: 5-day course of Levaquin      Acute metabolic encephalopathy Concern for possible CVA - Likely has baseline dementia exacerbated by hearing loss, hypernatremia and sepsis - Patient remains lethargic and has been evaluated by speech therapy --Suspect this is multifactorial secondary to rapid discontinuation of Sinemet, infection as above,  high dose atypical antipsychotics. --Low suspicion this patient truly did have a recurrent stroke however we are unable to do MRI due to presence of cochlear implant Plan: N.p.o. for now NG tube in place    Conjunctivitis - Also has purulent discharge bilaterally - Continue with Polytrim OU 4 times daily     Sensorineural hearing loss - Chronic, has had prior cochlear implant but this has since been lost. - Needs assistance with communication   Parkinson's disease - Diagnosis is somewhat in question.  Appears that Sinemet was initiated for tremors.  In any case the patient now needs Sinemet.  He has NG tube in place and Sinemet can be crushed     Hypernatremia - Secondary to dehydration -- Continue free water administration with D5W -- NG tube to administer free water  --Discontinue D5 water as sodium improves   Unclear baseline - Patient recently had prolonged hospital stay for sepsis, and his son reports during that time he was heavily sedated.  He comes to [place] now from [facility] where he is on 100 mg Seroquel 3 times daily and trazodone 150 mg.   Trazodone has been discontinued and  Seroquel has been changed to as needed since patient is sedated     Poor prognosis - Patient's age, comorbidities, and recent hospitalization for toxic metabolic encephalopathy with repeat admission not long after discharge is concerning.  Overall prognosis guarded..  Patient is DNR status.  Appreciate palliative care involvement in care    DVT prophylaxis: SQ Lovenox Code Status: DNR Family Communication: Daughter [name] 2/19 Disposition Plan: Status is: Inpatient Remains inpatient appropriate because: Metabolic encephalopathy of unclear etiology   Level of care: Med-Surg  Consultants:  Palliative care  Procedures:  NG tube  Antimicrobials: Levaquin   Subjective: Seen and examined.  Encephalopathic.  Lethargic.  Unable to provide history.  Objective: Vitals:   08/15/23  1807 08/15/23 2304 08/16/23 0500 08/16/23 0758  BP: 132/77 (!) 98/49  116/60  Pulse: 88 82  77  Resp: 18 18  16   Temp: 98 F (36.7 C) 98.4 F (36.9 C)  98.2 F (36.8 C)  TempSrc: Oral   Oral  SpO2: 96% 97%  95%  Weight:   70.4 kg   Height:        Intake/Output Summary (Last 24 hours) at 08/16/2023 1545 Last data filed at 08/16/2023 0346 Gross per 24 hour  Intake 735 ml  Output --  Net 735 ml   Filed Weights   08/12/23 1420 08/13/23 2100 08/16/23 0500  Weight: 68 kg 67.8 kg 70.4 kg    Examination:  General exam: Appears lethargic Respiratory system: Coarse breath sounds bilaterally.  Normal work of breathing.  Room air Cardiovascular system: S1-S2, RRR, no murmurs, no pedal edema Gastrointestinal system: Soft, NT/ND, normal bowel sounds Central nervous system: Lethargic.  Awake.  Unable to assess orientation Extremities: Decreased power symmetrically.  Unable to ambulate Skin: No rashes, lesions or ulcers Psychiatry: Unable to assess    Data Reviewed: I have personally reviewed following labs and imaging studies  CBC: Recent Labs  Lab 08/12/23 1428 08/13/23 2104 08/14/23 0434 08/15/23 0508 08/16/23 0521  WBC 15.4* 19.3* 19.3* 14.2* 9.8  NEUTROABS 12.9* 16.7* 15.8* 11.8* 7.6  HGB 17.6* 16.1 14.7 13.4 13.3  HCT 58.1* 53.2* 49.3 43.5 42.3  MCV 96.2 97.3 99.2 95.8 94.2  PLT 325 281 219 212 227   Basic Metabolic Panel: Recent Labs  Lab 08/13/23 2104 08/14/23 0434 08/14/23 1809 08/15/23 0508 08/15/23 1932 08/16/23 0521  NA 159* 158* 152* 154*  --  151*  K 3.5 4.2 2.9* 3.0*  --  3.2*  CL 120* 122* 117* 119*  --  120*  CO2 28 26 27 27   --  24  GLUCOSE 117* 106* 129* 109*  --  92  BUN 27* 26* 26* 24*  --  20  CREATININE 0.72 0.57* 0.44* 0.43*  --  0.39*  CALCIUM 8.8* 8.4* 8.2* 8.1*  --  8.0*  MG  --  2.4  --  2.2 2.3 2.3  PHOS  --  2.1*  --  1.7* 1.9* 2.5   GFR: Estimated Creatinine Clearance: 63 mL/min (A) (by C-G formula based on SCr of 0.39 mg/dL  (L)). Liver Function Tests: Recent Labs  Lab 08/12/23 1428 08/13/23 2104 08/14/23 0434 08/15/23 0508 08/16/23 0521  AST 61* 35 29 23 30   ALT 25 62* 47* 39 34  ALKPHOS 72 60 54 50 49  BILITOT 1.8* 1.1 0.9 0.8 0.4  PROT 6.8 6.4* 5.3* 5.0* 5.1*  ALBUMIN 3.5 3.1* 2.5* 2.2* 2.1*   No results for input(s): "LIPASE", "AMYLASE" in the last 168 hours. No results for input(s): "AMMONIA" in the last 168 hours. Coagulation Profile: No results for input(s): "INR", "PROTIME" in the last 168 hours. Cardiac Enzymes: No results for input(s): "CKTOTAL", "CKMB", "CKMBINDEX", "TROPONINI" in the last 168 hours. BNP (last 3 results) No results for input(s): "PROBNP" in the last 8760 hours. HbA1C: No results for input(s): "HGBA1C" in the last 72 hours. CBG: Recent Labs  Lab 08/15/23 2024 08/16/23 1127  GLUCAP 122* 108*   Lipid Profile: No results for input(s): "CHOL", "HDL", "LDLCALC", "TRIG", "CHOLHDL", "LDLDIRECT" in the last 72 hours. Thyroid Function Tests:  Recent Labs    08/13/23 2104  TSH 0.308*   Anemia Panel: No results for input(s): "VITAMINB12", "FOLATE", "FERRITIN", "TIBC", "IRON", "RETICCTPCT" in the last 72 hours. Sepsis Labs: Recent Labs  Lab 08/12/23 1428 08/12/23 1843 08/12/23 2202 08/16/23 0521  PROCALCITON  --   --   --  0.11  LATICACIDVEN 2.3* 3.3* 2.5*  --     Recent Results (from the past 240 hours)  Blood Culture (routine x 2)     Status: None (Preliminary result)   Collection Time: 08/12/23  2:23 PM   Specimen: BLOOD  Result Value Ref Range Status   Specimen Description BLOOD BLOOD LEFT ARM  Final   Special Requests   Final    BOTTLES DRAWN AEROBIC AND ANAEROBIC Blood Culture adequate volume   Culture   Final    NO GROWTH 4 DAYS Performed at [facility], [address], [place], [place] [address]    Report Status PENDING  Incomplete  Resp panel by RT-PCR (RSV, Flu A&B, Covid) Anterior Nasal Swab     Status: None   Collection Time:  08/12/23  2:28 PM   Specimen: Anterior Nasal Swab  Result Value Ref Range Status   SARS Coronavirus 2 by RT PCR NEGATIVE NEGATIVE Final    Comment: (NOTE) SARS-CoV-2 target nucleic acids are NOT DETECTED.  The SARS-CoV-2 RNA is generally detectable in upper respiratory specimens during the acute phase of infection. The lowest concentration of SARS-CoV-2 viral copies this assay can detect is 138 copies/mL. A negative result does not preclude SARS-Cov-2 infection and should not be used as the sole basis for treatment or other patient management decisions. A negative result may occur with  improper specimen collection/handling, submission of specimen other than nasopharyngeal swab, presence of viral mutation(s) within the areas targeted by this assay, and inadequate number of viral copies(<138 copies/mL). A negative result must be combined with clinical observations, patient history, and epidemiological information. The expected result is Negative.  Fact Sheet for Patients:  BloggerCourse.com  Fact Sheet for Healthcare Providers:  SeriousBroker.it  This test is no t yet approved or cleared by the [place] FDA and  has been authorized for detection and/or diagnosis of SARS-CoV-2 by FDA under an Emergency Use Authorization (EUA). This EUA will remain  in effect (meaning this test can be used) for the duration of the COVID-19 declaration under Section 564(b)(1) of the Act, 21 U.S.C.section 360bbb-3(b)(1), unless the authorization is terminated  or revoked sooner.       Influenza A by PCR NEGATIVE NEGATIVE Final   Influenza B by PCR NEGATIVE NEGATIVE Final    Comment: (NOTE) The Xpert Xpress SARS-CoV-2/FLU/RSV plus assay is intended as an aid in the diagnosis of influenza from Nasopharyngeal swab specimens and should not be used as a sole basis for treatment. Nasal washings and aspirates are unacceptable for Xpert Xpress  SARS-CoV-2/FLU/RSV testing.  Fact Sheet for Patients: BloggerCourse.com  Fact Sheet for Healthcare Providers: SeriousBroker.it  This test is not yet approved or cleared by the [place] FDA and has been authorized for detection and/or diagnosis of SARS-CoV-2 by FDA under an Emergency Use Authorization (EUA). This EUA will remain in effect (meaning this test can be used) for the duration of the COVID-19 declaration under Section 564(b)(1) of the Act, 21 U.S.C. section 360bbb-3(b)(1), unless the authorization is terminated or revoked.     Resp Syncytial Virus by PCR NEGATIVE NEGATIVE Final    Comment: (NOTE) Fact Sheet for Patients: BloggerCourse.com  Fact  Sheet for Healthcare Providers: SeriousBroker.it  This test is not yet approved or cleared by the [place] and has been authorized for detection and/or diagnosis of SARS-CoV-2 by FDA under an Emergency Use Authorization (EUA). This EUA will remain in effect (meaning this test can be used) for the duration of the COVID-19 declaration under Section 564(b)(1) of the Act, 21 U.S.C. section 360bbb-3(b)(1), unless the authorization is terminated or revoked.  Performed at [facility], [address], [place], [place] [address]   Urine Culture     Status: Abnormal   Collection Time: 08/12/23  8:55 PM   Specimen: Urine, Random  Result Value Ref Range Status   Specimen Description   Final    URINE, RANDOM Performed at [facility], [address] [place], [place] [address]    Special Requests   Final    NONE Reflexed from [id number] Performed at [facility], [address] [place], [place] [address]    Culture (A)  Final    40,000 COLONIES/mL AEROCOCCUS URINAE Standardized susceptibility testing for this organism is not available. Performed at [facility], [address]  [place], [place] [address]    Report Status 08/16/2023 FINAL  Final  Culture, blood (Routine X 2) w Reflex to ID Panel     Status: None (Preliminary result)   Collection Time: 08/13/23  9:04 PM   Specimen: BLOOD  Result Value Ref Range Status   Specimen Description BLOOD BLOOD RIGHT HAND BLOOD RIGHT ARM  Final   Special Requests   Final    Blood Culture results may not be optimal due to an inadequate volume of blood received in culture bottles   Culture   Final    NO GROWTH 3 DAYS Performed at [facility], [address] [place], [place] [address]    Report Status PENDING  Incomplete  MRSA Next Gen by PCR, Nasal     Status: None   Collection Time: 08/14/23  4:00 AM   Specimen: Nasal Mucosa; Nasal Swab  Result Value Ref Range Status   MRSA by PCR Next Gen NOT DETECTED NOT DETECTED Final    Comment: (NOTE) The GeneXpert MRSA Assay (FDA approved for NASAL specimens only), is one component of a comprehensive MRSA colonization surveillance program. It is not intended to diagnose MRSA infection nor to guide or monitor treatment for MRSA infections. Test performance is not FDA approved in patients less than 44 years old. Performed at [facility], [address], [place], [place] [address]          Radiology Studies: ECHOCARDIOGRAM COMPLETE Result Date: 08/16/2023    ECHOCARDIOGRAM REPORT   Patient Name:   ARIYAN BRISENDINE Date of Exam: 08/16/2023 Medical Rec #:  [id number]         Height:       65.0 in Accession #:    [id number]        Weight:       155.2 lb Date of Birth:  1940-11-09        BSA:          1.776 m Patient Age:    82 years          BP:           116/60 mmHg Patient Gender: M                 HR:           77 bpm. Exam Location:  [facility] Procedure: 2D Echo, Cardiac Doppler, Color Doppler and Saline Contrast Bubble            Study (Both Spectral and Color Flow Doppler were utilized during            procedure). Indications:     Stroke I63.9  History:          Patient has no prior history of Echocardiogram examinations.                  Parkiinsons.  Sonographer:     Cristela Blue Referring Phys:  [id number] AGBATA Diagnosing Phys: Julien Nordmann MD  Sonographer Comments: Suboptimal parasternal window. IMPRESSIONS  1. Left ventricular ejection fraction, by estimation, is 60 to 65%. The left ventricle has normal function. The left ventricle has no regional wall motion abnormalities. There is mild left ventricular hypertrophy. Left ventricular diastolic parameters are consistent with Grade I diastolic dysfunction (impaired relaxation).  2. Right ventricular systolic function is normal. The right ventricular size is normal. There is normal pulmonary artery systolic pressure. The estimated right ventricular systolic pressure is 35.0 mmHg.  3. A small pericardial effusion is present.  4. The mitral valve is normal in structure. No evidence of mitral valve regurgitation. No evidence of mitral stenosis.  5. The aortic valve is normal in structure. There is mild calcification of the aortic valve. Aortic valve regurgitation is not visualized. Aortic valve sclerosis/calcification is present, without any evidence of aortic stenosis. Aortic valve mean gradient measures 6.5 mmHg.  6. The inferior vena cava is normal in size with greater than 50% respiratory variability, suggesting right atrial pressure of 3 mmHg. FINDINGS  Left Ventricle: Left ventricular ejection fraction, by estimation, is 60 to 65%. The left ventricle has normal function. The left ventricle has no regional wall motion abnormalities. Strain imaging was not performed. The left ventricular internal cavity  size was normal in size. There is mild left ventricular hypertrophy. Left ventricular diastolic parameters are consistent with Grade I diastolic dysfunction (impaired relaxation). Right Ventricle: The right ventricular size is normal. No increase in right ventricular wall thickness. Right ventricular systolic  function is normal. There is normal pulmonary artery systolic pressure. The tricuspid regurgitant velocity is 2.74 m/s, and  with an assumed right atrial pressure of 5 mmHg, the estimated right ventricular systolic pressure is 35.0 mmHg. Left Atrium: Left atrial size was normal in size. Right Atrium: Right atrial size was normal in size. Pericardium: A small pericardial effusion is present. Mitral Valve: The mitral valve is normal in structure. No evidence of mitral valve regurgitation. No evidence of mitral valve stenosis. MV peak gradient, 4.8 mmHg. The mean mitral valve gradient is 3.0 mmHg. Tricuspid Valve: The tricuspid valve is normal in structure. Tricuspid valve regurgitation is not demonstrated. No evidence of tricuspid stenosis. Aortic Valve: The aortic valve is normal in structure. There is mild calcification of the aortic valve. Aortic valve regurgitation is not visualized. Aortic valve sclerosis/calcification is present, without any evidence of aortic stenosis. Aortic valve mean gradient measures 6.5 mmHg. Aortic valve peak gradient measures 11.2 mmHg. Aortic valve area, by VTI measures 4.33 cm. Pulmonic Valve: The pulmonic valve was normal in structure. Pulmonic valve regurgitation is not visualized. No evidence of pulmonic stenosis. Aorta: The aortic root is normal in size and structure. Venous: The inferior vena cava is normal in size with greater than 50% respiratory variability, suggesting right atrial pressure of 3 mmHg. IAS/Shunts: No atrial level shunt detected by color flow Doppler.  Agitated saline contrast was given intravenously to evaluate for intracardiac shunting. Additional Comments: 3D imaging was not performed.  LEFT VENTRICLE PLAX 2D LVIDd:         4.10 cm   Diastology LVIDs:         2.30 cm   LV e' medial:    7.62 cm/s LV PW:         1.20 cm   LV E/e' medial:  10.2 LV IVS:        1.10 cm   LV e' lateral:   8.16 cm/s LVOT diam:     2.10 cm   LV E/e' lateral: 9.5 LV SV:         125 LV  SV Index:   70 LVOT Area:     3.46 cm  RIGHT VENTRICLE RV Basal diam:  2.20 cm RV Mid diam:    2.00 cm RV S prime:     16.80 cm/s TAPSE (M-mode): 2.1 cm LEFT ATRIUM           Index        RIGHT ATRIUM          Index LA diam:      4.40 cm 2.48 cm/m   RA Area:     9.60 cm LA Vol (A2C): 41.1 ml 23.14 ml/m  RA Volume:   16.90 ml 9.52 ml/m LA Vol (A4C): 19.9 ml 11.20 ml/m  AORTIC VALVE AV Area (Vmax):    3.98 cm AV Area (Vmean):   4.03 cm AV Area (VTI):     4.33 cm AV Vmax:           167.00 cm/s AV Vmean:          117.000 cm/s AV VTI:            0.289 m AV Peak Grad:      11.2 mmHg AV Mean Grad:      6.5 mmHg LVOT Vmax:         192.00 cm/s LVOT Vmean:        136.000 cm/s LVOT VTI:          0.361 m LVOT/AV VTI ratio: 1.25  AORTA Ao Root diam: 3.70 cm MITRAL VALVE                TRICUSPID VALVE MV Area (PHT): 4.10 cm     TR Peak grad:   30.0 mmHg MV Area VTI:   5.04 cm     TR Vmax:        274.00 cm/s MV Peak grad:  4.8 mmHg MV Mean grad:  3.0 mmHg     SHUNTS MV Vmax:       1.09 m/s     Systemic VTI:  0.36 m MV Vmean:      77.8 cm/s    Systemic Diam: 2.10 cm MV Decel Time: 185 msec MV E velocity: 77.60 cm/s MV A velocity: 122.00 cm/s MV E/A ratio:  0.64 Julien Nordmann MD Electronically signed by Julien Nordmann MD Signature Date/Time: 08/16/2023/3:33:53 PM    Final    DG Naso G Tube Plc W/Fl W/Rad Result Date: 08/16/2023 CLINICAL DATA:  Stroke. Dysphagia. Failed feeding tube placement on the floor. EXAM: NASO G TUBE PLACEMENT WITH FL AND WITH RAD CONTRAST:  None. FLUOROSCOPY: Radiation Exposure Index (as provided by the fluoroscopic device): 44.6 mGy Kerma COMPARISON:  None Available. PROCEDURE/FINDINGS: The Dobbhoff feeding tube was lubricated with viscous lidocaine and inserted into the left nostril. Under fluoroscopic guidance, the  feeding tube was advanced into stomach and duodenum, with the tip terminating near the junction of the second and third portions of the duodenum. The tube was affixed to the  patient's nose with tape. The patient tolerated the procedure well without immediate postprocedural complication. IMPRESSION: Successful fluoroscopic guided placement of Dobbhoff feeding tube with tip in the duodenum. The tube is ready for immediate use. Electronically Signed   By: Obie Dredge M.D.   On: 08/16/2023 12:25   CT ANGIO HEAD NECK W WO CM Result Date: 08/15/2023 CLINICAL DATA:  Stroke follow-up EXAM: CT ANGIOGRAPHY HEAD AND NECK WITH AND WITHOUT CONTRAST TECHNIQUE: Multidetector CT imaging of the head and neck was performed using the standard protocol during bolus administration of intravenous contrast. Multiplanar CT image reconstructions and MIPs were obtained to evaluate the vascular anatomy. Carotid stenosis measurements (when applicable) are obtained utilizing NASCET criteria, using the distal internal carotid diameter as the denominator. RADIATION DOSE REDUCTION: This exam was performed according to the departmental dose-optimization program which includes automated exposure control, adjustment of the mA and/or kV according to patient size and/or use of iterative reconstruction technique. CONTRAST:  75mL OMNIPAQUE IOHEXOL 350 MG/ML SOLN COMPARISON:  08/12/2023 FINDINGS: Brain: Unchanged appearance of right PCA territory infarct. White matter hypoattenuation consistent with chronic small vessel disease. No acute intracranial hemorrhage. Mild volume loss. Streak artifacts from left-sided cochlear implant obscure parts of the left temporal and parietal lobes. Vascular: No hyperdense vessel or unexpected vascular calcification. Skull: The visualized skull base, calvarium and extracranial soft tissues are normal. Sinuses/Orbits: Moderate right maxillary and frontal sinus mucosal thickening. Other: None. CTA NECK FINDINGS Skeleton: No acute abnormality or high grade bony spinal canal stenosis. Other neck: Heterogeneous and enlarged thyroid gland. Upper chest: Superior left lower lobe area of  consolidation. Aortic arch: There is calcific atherosclerosis of the aortic arch. Conventional 3 vessel aortic branching pattern. RIGHT carotid system: No dissection, occlusion or aneurysm. Mild atherosclerotic calcification at the carotid bifurcation without hemodynamically significant stenosis. LEFT carotid system: No dissection, occlusion or aneurysm. Mild atherosclerotic calcification at the carotid bifurcation without hemodynamically significant stenosis. Vertebral arteries: Left dominant configuration. There is no dissection, occlusion or flow-limiting stenosis to the skull base (V1-V3 segments). CTA HEAD FINDINGS POSTERIOR CIRCULATION: Vertebral arteries are normal. No proximal occlusion of the anterior or inferior cerebellar arteries. Basilar artery is normal. Superior cerebellar arteries are normal. There is severe stenosis of the right PCA P2/P3 junction with possible short segment occlusion. Severe stenosis of the distal P3 segment of the left PCA. The left PCA is predominantly supplied by the posterior communicating artery. ANTERIOR CIRCULATION: Intracranial internal carotid arteries are normal. Anterior cerebral arteries are normal. Middle cerebral arteries are normal. Venous sinuses: As permitted by contrast timing, patent. Anatomic variants: None Review of the MIP images confirms the above findings. IMPRESSION: 1. 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 2. Severe stenosis of the distal P3 segment of the left PCA. 3. Unchanged appearance of right PCA territory infarct. 4. Superior left lower lobe area of consolidation, concerning for pneumonia. Electronically Signed   By: Deatra Robinson M.D.   On: 08/15/2023 22:56   DG Abd 1 View Result Date: 08/15/2023 CLINICAL DATA:  Check gastric catheter placement EXAM: ABDOMEN - 1 VIEW COMPARISON:  None Available. FINDINGS: Gastric catheter is noted with the tip extending into the left lower lobe. This should be  withdrawn completely and readvanced. IMPRESSION: Gastric catheter within the left lower lobe bronchus. This  should be completely withdrawn and readvanced. These results will be called to the ordering clinician or representative by the Radiologist Assistant, and communication documented in the PACS or [employer]. Electronically Signed   By: Alcide Clever M.D.   On: 08/15/2023 19:12        Scheduled Meds:  aspirin  81 mg Per Tube Daily   bisacodyl  10 mg Rectal QHS   carbidopa-levodopa  2 tablet Per NG tube TID   [START ON 08/17/2023] cyanocobalamin  1,000 mcg Per Tube Daily   [START ON 08/17/2023] doxazosin  1 mg Per Tube Daily   enoxaparin (LOVENOX) injection  40 mg Subcutaneous Q24H   [START ON 08/17/2023] escitalopram  5 mg Per Tube Daily   feeding supplement (PROSource TF20)  60 mL Per Tube Daily   free water  30 mL Per Tube Q4H   latanoprost  1 drop Both Eyes QHS   multivitamin with minerals  1 tablet Per Tube Daily   polyethylene glycol  17 g Per Tube BID   thiamine  100 mg Per Tube Daily   trimethoprim-polymyxin b  2 drop Both Eyes Q6H   Continuous Infusions:  dextrose 40 mL/hr at 08/16/23 0841   feeding supplement (OSMOLITE 1.5 CAL) 1,000 mL (08/16/23 1322)   levofloxacin (LEVAQUIN) IV Stopped (08/15/23 2109)     LOS: 3 days      Tresa Moore, MD [facility]   If 7PM-7AM, please contact night-coverage  08/16/2023, 3:45 PM

## 2023-08-16 NOTE — TOC Progression Note (Signed)
Transition of Care [facility]) - Progression Note    Patient Details  Name: Justin Bird MRN: [id number] Date of Birth: [birth year]  Transition of Care [facility]) CM/SW Contact  Garret Reddish, RN Phone Number: 08/16/2023, 3:37 PM  Clinical Narrative:     Chart reviewed.  Noted that patient is a resident of [facility].  I have left VM for Admission coordinator to confirm level of care.  TOC will continue to follow for discharge planning.         Expected Discharge Plan and Services                                               Social Determinants of Health (SDOH) Interventions SDOH Screenings   Food Insecurity: Patient Unable To Answer (08/14/2023)  Housing: Patient Unable To Answer (08/14/2023)  Transportation Needs: Patient Unable To Answer (08/14/2023)  Utilities: Patient Unable To Answer (08/14/2023)  Financial Resource Strain: Patient Declined (01/04/2023)   Received from [facility]  Social Connections: Patient Unable To Answer (08/14/2023)  Tobacco Use: Low Risk  (08/12/2023)    Readmission Risk Interventions     No data to display

## 2023-08-16 NOTE — Consult Note (Signed)
 Consultation Note Date: 08/16/2023 at 1345  Patient Name: Justin Bird  DOB: 12-19-1940  MRN: [id number]  Age / Sex: 83 y.o., male  PCP: Pcp, No Referring Physician: Tresa Moore, MD  HPI/Patient Profile: 83 y.o. male  with past medical history of depression, Parkinson's tremors, and hard of hearing (cochlear implant) admitted on 08/12/2023 with AMS, fever, and hypoxemia.  Patient is being treated for sepsis secondary to UTI (5-day course of Levaquin), acute metabolic encephalopathy with concern for possible CVA (Seroquel stopped, Sinemet restarted via NG) for which neurology has been consulted, conjunctivitis (Polytrim OU 4 times daily), hypernatremia secondary to dehydration (IVF).  PMT was consulted to support patient with goals of care discussions.  Of note, patient and family are familiar to me as I worked with him during patient's previous extended hospitalization.   Clinical Assessment and Goals of Care: Extensive chart review completed prior to meeting patient including labs, vital signs, imaging, progress notes, orders, and available advanced directive documents from current and previous encounters. I then met with patient at bedside. He is unable to participate in GOC or medical decision making independently at this time. He has mitts in place, NG to left nare, supplement [place] oxygen, in place, and is sleeping in NAD. He does not awaken to my verbal or light physical stimuli. No family or friends present during my visit.   After visiting with the patient, I spoke with patient's son [name] and his wife [name] over the phone to discuss diagnosis prognosis, GOC, EOL wishes, disposition and options.  I re-introduced Palliative Medicine as specialized medical care for people living with serious illness. It focuses on providing relief from the symptoms and stress of a serious illness. The goal is to  improve quality of life for both the patient and the family.   We discussed patient's current illness - UTI, acute metabolic encephalopathy and potential for stroke-and what it means in the larger context of patient's on-going co-morbidities -overall reduced functional status, depression.   Course of treatment and various modalities discussed extensively with family over the phone.  Space and opportunity provided for family to ask questions and voiced their concerns.  I detailed the importance of treating acute illnesses to reveal what patient's new chronic baseline will be.  I attempted to elicit values and goals of care important to the patient and family.  Family remains in agreement with DNR and limited interventions.  Patient would never want to be sustained on artificial means.  They also believe that patient's will to live and want to continue with aggressive treatment is questionable at this point.  They endorse he has waxed and waned on his wanting to continue with treatment versus excepting his medical situation.  However, they are hesitant since so many other acute issues are layered on top of his baseline depression.  Therapeutic silence, active listening, and emotional support provided.  [name] and [place] were grateful for my update and our discussion.  They asked that PMT continue to follow and support throughout his hospitalization.  I plan on following up with them tomorrow with any changes or updates.    Questions and concerns were addressed. The family was encouraged to call with questions or concerns.  PMT contact info given for patient's family to reach out with any acute palliative needs.  Primary Decision Maker NEXT OF KIN  Physical Exam Vitals reviewed.  Constitutional:      General: He is not in acute distress.    Appearance: He is normal weight. He is not ill-appearing.  HENT:     Head: Normocephalic.     Mouth/Throat:     Mouth: Mucous membranes are moist.   Cardiovascular:     Pulses: Normal pulses.  Pulmonary:     Effort: Pulmonary effort is normal.  Abdominal:     Palpations: Abdomen is soft.  Skin:    General: Skin is warm and dry.  Psychiatric:        Behavior: Behavior normal.     Palliative Assessment/Data: 30%     Thank you for this consult. Palliative medicine will continue to follow and assist holistically.   Time Total: 110 minutes  Time spent includes: Detailed review of medical records (labs, imaging, vital signs), medically appropriate exam (mental status, respiratory, cardiac, skin), discussed with treatment team, counseling and educating patient, family and staff, documenting clinical information, medication management and coordination of care.  Signed by: Georgiann Cocker, DNP, FNP-BC Palliative Medicine   Please contact Palliative Medicine Team providers via [facility] for questions and concerns.

## 2023-08-16 NOTE — Progress Notes (Signed)
                                                     Palliative Care Progress Note   Patient Name: Justin Bird       Date: 08/16/2023 DOB: 1940/07/29  Age: 82 y.o. MRN#: [id number] Attending Physician: Tresa Moore, MD Primary Care Physician: Pcp, No Admit Date: 08/12/2023  Extensive chart review completed including labs, vital signs, imaging, progress notes, orders, and available advanced directive documents from current and previous encounters.   Of note, patient/family are familiar to me as I followed patient during his previous hospitalization.  After reviewing the patient's chart, I attempted to see the patient at bedside.  Multiple attempts made throughout the day.  Patient was off the floor to procedure and not in a space, like HD suite, where I am able to visit with him at bedside.  I will attempt to see patient and continue goals of care discussions at a later date/time.  Thank you for allowing the Palliative Medicine Team to assist in the care of [facility].  Samara Deist L. Bonita Quin, DNP, FNP-BC Palliative Medicine Team  No charge

## 2023-08-16 NOTE — Progress Notes (Signed)
Speech Language Pathology Treatment: Dysphagia  Patient Details Name: Justin Bird MRN: [id number] DOB: [birth year] Today's Date: 08/16/2023 Time: 4098-1191 SLP Time Calculation (min) (ACUTE ONLY): 10 min  Assessment / Plan / Recommendation Clinical Impression  Pt seen for skilled dysphagia tx targeting PO trials. Pt able to rouse to name; however, waxing/waning LOA appreciated. Pt alert enough for safe acceptance PO trials. Non-verbal throughout session. Inability to follow commands during repositioning efforts which required max assistance from SLP or during oral care/PO trials. Pt on 2L/min O2 via [place]. Per chart, afebrile and WBC trending downward.  Oral care provided via foam toothette and oral care solution from kit at bedside. Dried white coating appreciated on lingual body with improved with oral care. Pt continues with frequent, tonic biting on swab despite verbal/tactile/visual cueing.  Pt given trials of ice chips. Pt continues with s/sx severe oral and suspected pharyngeal dysphagia. Pt exhibiting tonic bite again with ice chip trials as well as no appreciable oral motor movement to initiate oral acceptance of POs. Once ice chips placed in oral cavity via tsp by SLP, pt with significant oral holding, prolonged mastication. Pt with seemingly delayed swallow initiation and intermittent delayed throat clearing. Oral and pharyngeal s/sx did not improve with verbal/tactile/visual cues. Pt's overall swallow function likely impacted by cognitive deficits/AMS. Further PO trials deferred given severity of pt's clinical presentation and risk for aspiration/aspiration PNA.  Given pt's clinical risk factors, comorbidities, and overall presentation, a safe oral diet cannot be recommended at this time. Recommend NPO with frequent oral care. Aspiration precautions. Recommend Palliative Care consult for GOC/support. Noted RD following, DHTT placement tentatively planned for today under fluoro given  unsuccessful attempts yesterday per chart.   SLP to f/u per POC for ongoing re-assessment.   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 He was recently hospitalized for toxic metabolic encephalopathy  1/2-08/03/23. His course was complicated by urinary retention. Also noted were increased Meds for Agitation/behavior during the lengthy admission. Per chart notes, "patient had been doing well until family abruptly discontinued his Sinemet for unclear reasons, and he subsequently developed confusion/altered mental status for which he presented to ED. In the ED, MDs assessed pt d/t his increased Agitation indicating concern of "diagnosis to include early stage of Dementia, delirium, vitamin B12 deficiency and abnormal thyroid function. Patient has possibly missed some dose of Sinemet, which may have contributed partially due to withdrawal. Patient received IV Ativan, IV Haldol, IM Zyprexa in ED.". Pt had a Caregiver caring for him in the home prior to recent admissions.     He presents to [facility]-ED this admit for worsening mental status, fever and hypoxemia and dx'd w/ Sepsis.   CXR this admit: No acute cardiopulmonary abnormality.  2. Leftward deviation of the superior trachea. 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      SLP Plan  Continue with current plan of care      Recommendations for follow up therapy are one component of a multi-disciplinary discharge planning process, led by the attending physician.  Recommendations may be updated based on patient status, additional functional criteria and insurance authorization.    Recommendations  Diet recommendations: NPO Medication Administration: Via alternative means                 (Palliative Care; RD) Oral care QID;Staff/trained caregiver to provide oral care  Frequent or constant  Supervision/Assistance Dysphagia, oropharyngeal phase (R13.12)     Continue with current plan of care    Clyde Canterbury, M.S., CCC-SLP Speech-Language Pathologist [facility] [phone] (ASCOM)  Woodroe Chen  08/16/2023, 10:02 AM

## 2023-08-16 NOTE — Progress Notes (Signed)
*  PRELIMINARY RESULTS* Echocardiogram 2D Echocardiogram has been performed.  Cristela Blue 08/16/2023, 2:20 PM

## 2023-08-16 NOTE — Progress Notes (Signed)
Nutrition Follow-up  DOCUMENTATION CODES:   Non-severe (moderate) malnutrition in context of chronic illness  INTERVENTION:   -Once NGT placement has been verified, recommend:   Initiate Osmolite 1.5 @ 20 ml/hr and increase by 10 ml every 8 hours to goal rate of 50 ml/hr.    60 ml Prosource TF daily     30 free water flush every 4 hours   Tube feeding regimen provides 1880 kcal (100% of needs), 95 grams of protein, and 914 ml of H2O. Total free water: 10964 ml daily   -Monitor Mg, K, and Phos and replete as needed secondary to high feeding risk -MVI with minerals daily via tube x 10 days -100 mg thiamine daily via tube x 7 days -Pharmacy consulted for electrolyte management  NUTRITION DIAGNOSIS:   Moderate Malnutrition related to chronic illness (Parkinson's) as evidenced by mild fat depletion, moderate fat depletion, percent weight loss, mild muscle depletion, moderate muscle depletion.  Ongoing  GOAL:   Patient will meet greater than or equal to 90% of their needs  Unmet  MONITOR:   Diet advancement, TF tolerance  REASON FOR ASSESSMENT:   Consult Enteral/tube feeding initiation and management  ASSESSMENT:   Pt with medical history significant of hard of hearing, depression, Parkinson's disease, allergy, who presents with altered mental status.  2/17- s/p BSE- NPO 2/18- s/p BSE- NPO, bedside NGT placement unsuccessful 2/19- s/p BSE- NPO  Reviewed I/O's: +435 ml x 24 hours and +6.8 L since admission  UOP: 300 ml x 24 hours   Per H&P, at baseline pt is oriented to person and place, confused about time. Pt with increased confusion for 4 days PTA. CT of head negative for acute intracranial abnormalities.    Pt with newly diagnosed rt occipital lobe ischemic infraction; neurology consulted. Plan for stroke work-up.    Pt from [facility] PTA.   Pt's mental status unchanged since yesterday. Unable to provide history. No family at bedside.   RN  attempted bedside NGT placement unsuccessfully x 2. Noted orders to place NGT via fluoroscopy today. TF orders already in chart. Pharmacy following for electrolyte management secondary to high refeeding risk.   Medications reviewed and include sinemet, vitamin B-12, lovenox, miralax, thiamine, potassium chloride, and dextrose 5% solution @ 40 ml/hr.   Labs reviewed: Na: 151, K: 3.2 (on supplementation), Mg and Phos WDL, CBGS: 122 (inpatient orders for glycemic control are none).    Diet Order:   Diet Order             Diet NPO time specified  Diet effective now                   EDUCATION NEEDS:   Not appropriate for education at this time  Skin:  Skin Assessment: Skin Integrity Issues: Skin Integrity Issues:: Other (Comment), Stage II Stage II: buttocks Other: skin tear to lt hand  Last BM:  08/15/23  Height:   Ht Readings from Last 1 Encounters:  08/13/23 5\' 5"  (1.651 m)    Weight:   Wt Readings from Last 1 Encounters:  08/16/23 70.4 kg    Ideal Body Weight:  61.8 kg  BMI:  Body mass index is 25.83 kg/m.  Estimated Nutritional Needs:   Kcal:  1850-2050  Protein:  90-105 grams  Fluid:  > 1.8 L    Levada Schilling, RD, LDN, CDCES Registered Dietitian III Certified Diabetes Care and Education Specialist If unable to reach this RD, please use "RD Inpatient" group  chat on secure chat between hours of 8am-4 pm daily

## 2023-08-16 NOTE — Consult Note (Signed)
PHARMACY CONSULT NOTE - FOLLOW UP  Pharmacy Consult for Electrolyte Monitoring and Replacement   Recent Labs: Potassium (mmol/L)  Date Value  08/16/2023 3.3 (L)   Magnesium (mg/dL)  Date Value  16/03/9603 2.3   Calcium (mg/dL)  Date Value  54/02/8118 8.0 (L)   Albumin (g/dL)  Date Value  14/78/2956 2.1 (L)   Phosphorus (mg/dL)  Date Value  21/30/8657 2.5   Sodium (mmol/L)  Date Value  08/16/2023 151 (H)   Corrected Ca: 9.5             Ca 8.0   Albumin 2.1  Assessment: 83 y.o. 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  Recent hospital course was complicated by urinary retention.  He has been nonverbal since his last hospitalization.  He presents on this admission with worsening mental status, fever, hypoxemia   MIVF: D5@40  ml/hr Diet: Unable to get NG tube in on 2/18, currently NPO Free water 30 ml q4H   Goal of Therapy:  WNL  Plan:  K 3.3, give Kcl 10 mEq IV x 3 F/u with AM labs.   Barrie Folk ,PharmD Clinical Pharmacist 08/16/2023 5:51 PM

## 2023-08-16 NOTE — Consult Note (Addendum)
PHARMACY CONSULT NOTE - FOLLOW UP  Pharmacy Consult for Electrolyte Monitoring and Replacement   Recent Labs: Potassium (mmol/L)  Date Value  08/16/2023 3.2 (L)   Magnesium (mg/dL)  Date Value  40/98/1191 2.3   Calcium (mg/dL)  Date Value  47/82/9562 8.0 (L)   Albumin (g/dL)  Date Value  13/01/6577 2.1 (L)   Phosphorus (mg/dL)  Date Value  46/96/2952 2.5   Sodium (mmol/L)  Date Value  08/16/2023 151 (H)   Corrected Ca: 9.5             Ca 8.0   Albumin 2.1  Assessment: 83 y.o. 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  Recent hospital course was complicated by urinary retention.  He has been nonverbal since his last hospitalization.  He presents on this admission with worsening mental status, fever, hypoxemia   MIVF: D5@40  ml/hr Diet: Unable to get NG tube in on 2/18, currently NPO Free water 30 ml q4H   Goal of Therapy:  WNL  Plan:  K 3.2    Will order KCL 10 meq IV q2h x 3 as pt NPO and NG tube was unable to be placed on 2/18 Mag and Phos ordered 5A&5P thru 2/20 am per dietary/MD F/u with AM labs.   Angelique Blonder ,PharmD Clinical Pharmacist 08/16/2023 7:35 AM

## 2023-08-16 NOTE — Plan of Care (Signed)

## 2023-08-17 ENCOUNTER — Inpatient Hospital Stay: Payer: Medicare Other

## 2023-08-17 DIAGNOSIS — A419 Sepsis, unspecified organism: Secondary | ICD-10-CM | POA: Diagnosis not present

## 2023-08-17 DIAGNOSIS — H903 Sensorineural hearing loss, bilateral: Secondary | ICD-10-CM | POA: Diagnosis not present

## 2023-08-17 DIAGNOSIS — H1033 Unspecified acute conjunctivitis, bilateral: Secondary | ICD-10-CM | POA: Diagnosis not present

## 2023-08-17 DIAGNOSIS — G9341 Metabolic encephalopathy: Secondary | ICD-10-CM | POA: Diagnosis not present

## 2023-08-17 DIAGNOSIS — N390 Urinary tract infection, site not specified: Secondary | ICD-10-CM | POA: Diagnosis not present

## 2023-08-17 LAB — CBC WITH DIFFERENTIAL/PLATELET
Abs Immature Granulocytes: 0.1 10*3/uL — ABNORMAL HIGH (ref 0.00–0.07)
Basophils Absolute: 0.1 10*3/uL (ref 0.0–0.1)
Basophils Relative: 1 %
Eosinophils Absolute: 0.3 10*3/uL (ref 0.0–0.5)
Eosinophils Relative: 4 %
HCT: 43.6 % (ref 39.0–52.0)
Hemoglobin: 13 g/dL (ref 13.0–17.0)
Immature Granulocytes: 1 %
Lymphocytes Relative: 13 %
Lymphs Abs: 1 10*3/uL (ref 0.7–4.0)
MCH: 29.3 pg (ref 26.0–34.0)
MCHC: 29.8 g/dL — ABNORMAL LOW (ref 30.0–36.0)
MCV: 98.4 fL (ref 80.0–100.0)
Monocytes Absolute: 0.6 10*3/uL (ref 0.1–1.0)
Monocytes Relative: 8 %
Neutro Abs: 5.7 10*3/uL (ref 1.7–7.7)
Neutrophils Relative %: 73 %
Platelets: 156 10*3/uL (ref 150–400)
RBC: 4.43 MIL/uL (ref 4.22–5.81)
RDW: 13.9 % (ref 11.5–15.5)
WBC: 7.8 10*3/uL (ref 4.0–10.5)
nRBC: 0 % (ref 0.0–0.2)

## 2023-08-17 LAB — COMPREHENSIVE METABOLIC PANEL
ALT: 17 U/L (ref 0–44)
AST: 31 U/L (ref 15–41)
Albumin: 2 g/dL — ABNORMAL LOW (ref 3.5–5.0)
Alkaline Phosphatase: 47 U/L (ref 38–126)
Anion gap: 5 (ref 5–15)
BUN: 18 mg/dL (ref 8–23)
CO2: 23 mmol/L (ref 22–32)
Calcium: 7.7 mg/dL — ABNORMAL LOW (ref 8.9–10.3)
Chloride: 118 mmol/L — ABNORMAL HIGH (ref 98–111)
Creatinine, Ser: 0.39 mg/dL — ABNORMAL LOW (ref 0.61–1.24)
GFR, Estimated: >60 mL/min (ref >60)
Glucose, Bld: 142 mg/dL — ABNORMAL HIGH (ref 70–99)
Potassium: 3.7 mmol/L (ref 3.5–5.1)
Sodium: 146 mmol/L — ABNORMAL HIGH (ref 135–145)
Total Bilirubin: 0.5 mg/dL (ref 0.0–1.2)
Total Protein: 4.5 g/dL — ABNORMAL LOW (ref 6.5–8.1)

## 2023-08-17 LAB — GLUCOSE, CAPILLARY
Glucose-Capillary: 140 mg/dL — ABNORMAL HIGH (ref 70–99)
Glucose-Capillary: 161 mg/dL — ABNORMAL HIGH (ref 70–99)
Glucose-Capillary: 148 mg/dL — ABNORMAL HIGH (ref 70–99)
Glucose-Capillary: 134 mg/dL — ABNORMAL HIGH (ref 70–99)
Glucose-Capillary: 122 mg/dL — ABNORMAL HIGH (ref 70–99)

## 2023-08-17 LAB — CULTURE, BLOOD (ROUTINE X 2)
Culture: NO GROWTH
Special Requests: ADEQUATE

## 2023-08-17 LAB — MAGNESIUM: Magnesium: 2.3 mg/dL (ref 1.7–2.4)

## 2023-08-17 LAB — URINALYSIS, COMPLETE (UACMP) WITH MICROSCOPIC
Bacteria, UA: NONE SEEN
Bilirubin Urine: NEGATIVE
Glucose, UA: NEGATIVE mg/dL
Hgb urine dipstick: NEGATIVE
Ketones, ur: NEGATIVE mg/dL
Nitrite: NEGATIVE
Protein, ur: NEGATIVE mg/dL
Specific Gravity, Urine: 1.028 (ref 1.005–1.030)
pH: 6 (ref 5.0–8.0)

## 2023-08-17 LAB — PHOSPHORUS: Phosphorus: 2.3 mg/dL — ABNORMAL LOW (ref 2.5–4.6)

## 2023-08-17 MED ORDER — ADULT MULTIVITAMIN W/MINERALS CH
1.0000 | ORAL_TABLET | Freq: Every day | ORAL | Status: DC
  Administered 2023-08-17 – 2023-08-19 (×3): 1
  Filled 2023-08-17 (×2): qty 1

## 2023-08-17 MED ORDER — VITAMIN C 500 MG PO TABS
500.0000 mg | ORAL_TABLET | Freq: Two times a day (BID) | ORAL | Status: DC
  Administered 2023-08-17 – 2023-08-19 (×6): 500 mg
  Filled 2023-08-17 (×6): qty 1

## 2023-08-17 MED ORDER — ZINC SULFATE 220 (50 ZN) MG PO CAPS
220.0000 mg | ORAL_CAPSULE | Freq: Every day | ORAL | Status: DC
  Administered 2023-08-17 – 2023-08-19 (×3): 220 mg
  Filled 2023-08-17 (×3): qty 1

## 2023-08-17 MED ORDER — DEXTROSE 5 % IV SOLN: INTRAVENOUS | Status: DC

## 2023-08-17 NOTE — Plan of Care (Addendum)
TTE shows normal LVEF of 60 to 65% and no LV regional wall motion abnormalities. No mural thrombus or valvular vegetation is mentioned in the report.    A/R: 83 y.o. male with recent onset of aphasia, who re-presented to the hospital with AMS in the setting of sepsis. 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  - Stroke work up is now complete - Continue ASA 81 mg po every day - Continue his Sinemet as abrupt withdrawal can precipitate NMS. May benefit from an outpatient Neurology assessment by a Movement Disorders specialist for a second opinion.  - Seroquel has been stopped due to concern that it may have contributed to or caused his rigidity.  - Outpatient Neurology follow up for his stroke - Neurohospitalist service will sign off. Please call if there are additional questions.   Electronically signed: Dr. Caryl Pina

## 2023-08-17 NOTE — Progress Notes (Signed)
PROGRESS NOTE    Justin Bird  UJW:[id number] DOB: 1940/11/15 DOA: 08/12/2023 PCP: Pcp, No    Brief Narrative:   Justin Bird is 83 y.o. 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  Recent hospital course was complicated by urinary retention.  He has been nonverbal since his last hospitalization.  He presents on this admission with worsening mental status, fever, hypoxemia On arrival to the ED Tmax 101, BP 87/45 which improved with fluid resuscitation.  Labs reveal mild lactic acidosis, hypernatremia 158, chloride 120, glucose 138, WBC 15.4, hemoglobin 17.6.  CT head reveals chronic occipital infarct as well as right frontal, ethmoid and maxillary sinus disease.  He received 2 L LR, 500 cc NS, aztreonam, vancomycin, Flagyl.  TRH admitted the patient for sepsis.    Assessment & Plan:   Principal Problem:   Sepsis secondary to UTI [facility]) Active Problems:   Acute metabolic encephalopathy   SNHL (sensory-neural hearing loss), asymmetrical   Conjunctivitis   Parkinson's disease (HCC)   Malnutrition of moderate degree  Sepsis - Criteria met on arrival with: Fever 101, hypotensive, altered mental status, leukocytosis -- Presumed urinary source, CXR showed no acute cardiopulmonary disease - Status post fluid resuscitation.  Blood pressure has improved -- Urine culture yields 40,000 colonies of gram-positive cocci, Aerococcus.  Unclear whether this represents true infection Plan: 5-day course of Levaquin, last dose 2/21      Acute metabolic encephalopathy Concern for possible CVA - Likely has baseline dementia exacerbated by hearing loss, hypernatremia and sepsis - Patient remains lethargic and has been evaluated by speech therapy --Suspect this is multifactorial secondary to rapid discontinuation of Sinemet,  infection as above, high dose atypical antipsychotics. --Low suspicion this patient truly did have a recurrent stroke however we are unable to do MRI due to presence of cochlear implant Plan: N.p.o. for now NG tube in place Sinemet via tube Seroquel on hold    Conjunctivitis - Also has purulent discharge bilaterally - Continue with Polytrim OU 4 times daily -- Improved over interval     Sensorineural hearing loss - Chronic, has had prior cochlear implant but this has since been lost. - Needs assistance with communication --Caregiver brought in hearing aids   Parkinson's disease - Diagnosis is somewhat in question.  Appears that Sinemet was initiated for tremors.  In any case the patient now needs Sinemet.  He has NG tube in place and Sinemet can be crushed     Hypernatremia - Secondary to dehydration -- Continue free water administration with D5W -- NG tube to administer free water  --Discontinue D5 water as sodium improves --Anticipate dc D5W on 2/21   Unclear baseline - Patient recently had prolonged hospital stay for sepsis, and his son reports during that time he was heavily sedated.  He comes to [place] now from [facility] where he is on 100 mg Seroquel 3 times daily and trazodone 150 mg.   Trazodone has been discontinued and  Seroquel has been changed to as needed since patient is sedated     Poor prognosis - Patient's age, comorbidities, and recent hospitalization for toxic metabolic encephalopathy with repeat admission not long after discharge is concerning.  Overall prognosis guarded..  Patient is DNR status.  Appreciate palliative care involvement in care    DVT prophylaxis: SQ Lovenox Code Status: DNR Family Communication: Daughter [name] [phone] 2/19, voicemail left on 2/20  Disposition Plan: Status is: Inpatient Remains inpatient appropriate because: Metabolic encephalopathy of unclear etiology   Level of care: Med-Surg  Consultants:  Palliative  care  Procedures:  NG tube  Antimicrobials: Levaquin   Subjective: Examined.  Caregiver [name] at bedside.  Slightly more responsive today.  Objective: Vitals:   08/16/23 0500 08/16/23 0758 08/16/23 1620 08/16/23 2000  BP:  116/60 109/61 108/64  Pulse:  77 91 86  Resp:  16 18   Temp:  98.2 F (36.8 C) 98.3 F (36.8 C) 98.1 F (36.7 C)  TempSrc:  Oral Oral Axillary  SpO2:  95% 98% 98%  Weight: 70.4 kg     Height:        Intake/Output Summary (Last 24 hours) at 08/17/2023 1300 Last data filed at 08/17/2023 0500 Gross per 24 hour  Intake 2093.25 ml  Output --  Net 2093.25 ml   Filed Weights   08/12/23 1420 08/13/23 2100 08/16/23 0500  Weight: 68 kg 67.8 kg 70.4 kg    Examination:  General exam: Appears fatigued and chronically ill Respiratory system: Bibasilar crackles.  Normal work of breathing.  Room air Cardiovascular system: S1-S2, RRR, no murmurs, no pedal edema Gastrointestinal system: Soft, NT/ND, normal bowel sounds Central nervous system: Lethargic.  Awake.  Unable to assess orientation Extremities: Decreased power symmetrically.  Unable to ambulate Skin: No rashes, lesions or ulcers Psychiatry: Unable to assess    Data Reviewed: I have personally reviewed following labs and imaging studies  CBC: Recent Labs  Lab 08/13/23 2104 08/14/23 0434 08/15/23 0508 08/16/23 0521 08/17/23 0510  WBC 19.3* 19.3* 14.2* 9.8 7.8  NEUTROABS 16.7* 15.8* 11.8* 7.6 5.7  HGB 16.1 14.7 13.4 13.3 13.0  HCT 53.2* 49.3 43.5 42.3 43.6  MCV 97.3 99.2 95.8 94.2 98.4  PLT 281 219 212 227 156   Basic Metabolic Panel: Recent Labs  Lab 08/14/23 0434 08/14/23 1809 08/15/23 0508 08/15/23 1932 08/16/23 0521 08/16/23 1714 08/17/23 0510  NA 158* 152* 154*  --  151*  --  146*  K 4.2 2.9* 3.0*  --  3.2* 3.3* 3.7  CL 122* 117* 119*  --  120*  --  118*  CO2 26 27 27   --  24  --  23  GLUCOSE 106* 129* 109*  --  92  --  142*  BUN 26* 26* 24*  --  20  --  18  CREATININE  0.57* 0.44* 0.43*  --  0.39*  --  0.39*  CALCIUM 8.4* 8.2* 8.1*  --  8.0*  --  7.7*  MG 2.4  --  2.2 2.3 2.3  --  2.3  PHOS 2.1*  --  1.7* 1.9* 2.5  --  2.3*   GFR: Estimated Creatinine Clearance: 63 mL/min (A) (by C-G formula based on SCr of 0.39 mg/dL (L)). Liver Function Tests: Recent Labs  Lab 08/13/23 2104 08/14/23 0434 08/15/23 0508 08/16/23 0521 08/17/23 0510  AST 35 29 23 30 31   ALT 62* 47* 39 34 17  ALKPHOS 60 54 50 49 47  BILITOT 1.1 0.9 0.8 0.4 0.5  PROT 6.4* 5.3* 5.0* 5.1* 4.5*  ALBUMIN 3.1* 2.5* 2.2* 2.1* 2.0*   No results for input(s): "LIPASE", "AMYLASE" in the last 168 hours. No results for input(s): "AMMONIA" in the last 168 hours. Coagulation Profile: No results for input(s): "INR", "PROTIME" in the last 168 hours. Cardiac Enzymes: No results for input(s): "CKTOTAL", "CKMB", "CKMBINDEX", "TROPONINI" in the last 168 hours. BNP (last 3 results) No results for  input(s): "PROBNP" in the last 8760 hours. HbA1C: No results for input(s): "HGBA1C" in the last 72 hours. CBG: Recent Labs  Lab 08/16/23 2110 08/16/23 2321 08/17/23 0455 08/17/23 0759 08/17/23 1154  GLUCAP 132* 132* 140* 161* 148*   Lipid Profile: No results for input(s): "CHOL", "HDL", "LDLCALC", "TRIG", "CHOLHDL", "LDLDIRECT" in the last 72 hours. Thyroid Function Tests: No results for input(s): "TSH", "T4TOTAL", "FREET4", "T3FREE", "THYROIDAB" in the last 72 hours.  Anemia Panel: No results for input(s): "VITAMINB12", "FOLATE", "FERRITIN", "TIBC", "IRON", "RETICCTPCT" in the last 72 hours. Sepsis Labs: Recent Labs  Lab 08/12/23 1428 08/12/23 1843 08/12/23 2202 08/16/23 0521  PROCALCITON  --   --   --  0.11  LATICACIDVEN 2.3* 3.3* 2.5*  --     Recent Results (from the past 240 hours)  Blood Culture (routine x 2)     Status: None   Collection Time: 08/12/23  2:23 PM   Specimen: BLOOD  Result Value Ref Range Status   Specimen Description BLOOD BLOOD LEFT ARM  Final   Special  Requests   Final    BOTTLES DRAWN AEROBIC AND ANAEROBIC Blood Culture adequate volume   Culture   Final    NO GROWTH 5 DAYS Performed at [facility], [address] [place], [place] [address]    Report Status 08/17/2023 FINAL  Final  Resp panel by RT-PCR (RSV, Flu A&B, Covid) Anterior Nasal Swab     Status: None   Collection Time: 08/12/23  2:28 PM   Specimen: Anterior Nasal Swab  Result Value Ref Range Status   SARS Coronavirus 2 by RT PCR NEGATIVE NEGATIVE Final    Comment: (NOTE) SARS-CoV-2 target nucleic acids are NOT DETECTED.  The SARS-CoV-2 RNA is generally detectable in upper respiratory specimens during the acute phase of infection. The lowest concentration of SARS-CoV-2 viral copies this assay can detect is 138 copies/mL. A negative result does not preclude SARS-Cov-2 infection and should not be used as the sole basis for treatment or other patient management decisions. A negative result may occur with  improper specimen collection/handling, submission of specimen other than nasopharyngeal swab, presence of viral mutation(s) within the areas targeted by this assay, and inadequate number of viral copies(<138 copies/mL). A negative result must be combined with clinical observations, patient history, and epidemiological information. The expected result is Negative.  Fact Sheet for Patients:  BloggerCourse.com  Fact Sheet for Healthcare Providers:  SeriousBroker.it  This test is no t yet approved or cleared by the [place] FDA and  has been authorized for detection and/or diagnosis of SARS-CoV-2 by FDA under an Emergency Use Authorization (EUA). This EUA will remain  in effect (meaning this test can be used) for the duration of the COVID-19 declaration under Section 564(b)(1) of the Act, 21 U.S.C.section 360bbb-3(b)(1), unless the authorization is terminated  or revoked sooner.       Influenza A by PCR  NEGATIVE NEGATIVE Final   Influenza B by PCR NEGATIVE NEGATIVE Final    Comment: (NOTE) The Xpert Xpress SARS-CoV-2/FLU/RSV plus assay is intended as an aid in the diagnosis of influenza from Nasopharyngeal swab specimens and should not be used as a sole basis for treatment. Nasal washings and aspirates are unacceptable for Xpert Xpress SARS-CoV-2/FLU/RSV testing.  Fact Sheet for Patients: BloggerCourse.com  Fact Sheet for Healthcare Providers: SeriousBroker.it  This test is not yet approved or cleared by the [place] FDA and has been authorized for detection and/or diagnosis of SARS-CoV-2 by FDA under an  Emergency Use Authorization (EUA). This EUA will remain in effect (meaning this test can be used) for the duration of the COVID-19 declaration under Section 564(b)(1) of the Act, 21 U.S.C. section 360bbb-3(b)(1), unless the authorization is terminated or revoked.     Resp Syncytial Virus by PCR NEGATIVE NEGATIVE Final    Comment: (NOTE) Fact Sheet for Patients: BloggerCourse.com  Fact Sheet for Healthcare Providers: SeriousBroker.it  This test is not yet approved or cleared by the [place] FDA and has been authorized for detection and/or diagnosis of SARS-CoV-2 by FDA under an Emergency Use Authorization (EUA). This EUA will remain in effect (meaning this test can be used) for the duration of the COVID-19 declaration under Section 564(b)(1) of the Act, 21 U.S.C. section 360bbb-3(b)(1), unless the authorization is terminated or revoked.  Performed at [facility], [address] [place], [place] [address]   Urine Culture     Status: Abnormal   Collection Time: 08/12/23  8:55 PM   Specimen: Urine, Random  Result Value Ref Range Status   Specimen Description   Final    URINE, RANDOM Performed at [facility], [address] [place],  [place] [address]    Special Requests   Final    NONE Reflexed from [id number] Performed at [facility], [address] [place], [place] [address]    Culture (A)  Final    40,000 COLONIES/mL AEROCOCCUS URINAE Standardized susceptibility testing for this organism is not available. Performed at [facility], [address] [place], [place] [address]    Report Status 08/16/2023 FINAL  Final  Culture, blood (Routine X 2) w Reflex to ID Panel     Status: None (Preliminary result)   Collection Time: 08/13/23  9:04 PM   Specimen: BLOOD  Result Value Ref Range Status   Specimen Description BLOOD BLOOD RIGHT HAND BLOOD RIGHT ARM  Final   Special Requests   Final    Blood Culture results may not be optimal due to an inadequate volume of blood received in culture bottles   Culture   Final    NO GROWTH 4 DAYS Performed at [facility], [address] [place], [place] [address]    Report Status PENDING  Incomplete  MRSA Next Gen by PCR, Nasal     Status: None   Collection Time: 08/14/23  4:00 AM   Specimen: Nasal Mucosa; Nasal Swab  Result Value Ref Range Status   MRSA by PCR Next Gen NOT DETECTED NOT DETECTED Final    Comment: (NOTE) The GeneXpert MRSA Assay (FDA approved for NASAL specimens only), is one component of a comprehensive MRSA colonization surveillance program. It is not intended to diagnose MRSA infection nor to guide or monitor treatment for MRSA infections. Test performance is not FDA approved in patients less than 45 years old. Performed at [facility], [address] [place], [place] [address]          Radiology Studies: ECHOCARDIOGRAM COMPLETE Result Date: 08/16/2023    ECHOCARDIOGRAM REPORT   Patient Name:   KYLLE LALL Date of Exam: 08/16/2023 Medical Rec #:  [id number]         Height:       65.0 in Accession #:    [id number]        Weight:       155.2 lb Date of Birth:  1941-06-18        BSA:          1.776  m Patient Age:    82  years          BP:           116/60 mmHg Patient Gender: M                 HR:           77 bpm. Exam Location:  [facility] Procedure: 2D Echo, Cardiac Doppler, Color Doppler and Saline Contrast Bubble            Study (Both Spectral and Color Flow Doppler were utilized during            procedure). Indications:     Stroke I63.9  History:         Patient has no prior history of Echocardiogram examinations.                  Parkiinsons.  Sonographer:     Cristela Blue Referring Phys:  [id number] AGBATA Diagnosing Phys: Julien Nordmann MD  Sonographer Comments: Suboptimal parasternal window. IMPRESSIONS  1. Left ventricular ejection fraction, by estimation, is 60 to 65%. The left ventricle has normal function. The left ventricle has no regional wall motion abnormalities. There is mild left ventricular hypertrophy. Left ventricular diastolic parameters are consistent with Grade I diastolic dysfunction (impaired relaxation).  2. Right ventricular systolic function is normal. The right ventricular size is normal. There is normal pulmonary artery systolic pressure. The estimated right ventricular systolic pressure is 35.0 mmHg.  3. A small pericardial effusion is present.  4. The mitral valve is normal in structure. No evidence of mitral valve regurgitation. No evidence of mitral stenosis.  5. The aortic valve is normal in structure. There is mild calcification of the aortic valve. Aortic valve regurgitation is not visualized. Aortic valve sclerosis/calcification is present, without any evidence of aortic stenosis. Aortic valve mean gradient measures 6.5 mmHg.  6. The inferior vena cava is normal in size with greater than 50% respiratory variability, suggesting right atrial pressure of 3 mmHg. FINDINGS  Left Ventricle: Left ventricular ejection fraction, by estimation, is 60 to 65%. The left ventricle has normal function. The left ventricle has no regional wall motion abnormalities. Strain imaging was not performed. The left  ventricular internal cavity  size was normal in size. There is mild left ventricular hypertrophy. Left ventricular diastolic parameters are consistent with Grade I diastolic dysfunction (impaired relaxation). Right Ventricle: The right ventricular size is normal. No increase in right ventricular wall thickness. Right ventricular systolic function is normal. There is normal pulmonary artery systolic pressure. The tricuspid regurgitant velocity is 2.74 m/s, and  with an assumed right atrial pressure of 5 mmHg, the estimated right ventricular systolic pressure is 35.0 mmHg. Left Atrium: Left atrial size was normal in size. Right Atrium: Right atrial size was normal in size. Pericardium: A small pericardial effusion is present. Mitral Valve: The mitral valve is normal in structure. No evidence of mitral valve regurgitation. No evidence of mitral valve stenosis. MV peak gradient, 4.8 mmHg. The mean mitral valve gradient is 3.0 mmHg. Tricuspid Valve: The tricuspid valve is normal in structure. Tricuspid valve regurgitation is not demonstrated. No evidence of tricuspid stenosis. Aortic Valve: The aortic valve is normal in structure. There is mild calcification of the aortic valve. Aortic valve regurgitation is not visualized. Aortic valve sclerosis/calcification is present, without any evidence of aortic stenosis. Aortic valve mean gradient measures 6.5 mmHg. Aortic valve peak gradient measures 11.2 mmHg. Aortic valve area, by VTI  measures 4.33 cm. Pulmonic Valve: The pulmonic valve was normal in structure. Pulmonic valve regurgitation is not visualized. No evidence of pulmonic stenosis. Aorta: The aortic root is normal in size and structure. Venous: The inferior vena cava is normal in size with greater than 50% respiratory variability, suggesting right atrial pressure of 3 mmHg. IAS/Shunts: No atrial level shunt detected by color flow Doppler. Agitated saline contrast was given intravenously to evaluate for intracardiac  shunting. Additional Comments: 3D imaging was not performed.  LEFT VENTRICLE PLAX 2D LVIDd:         4.10 cm   Diastology LVIDs:         2.30 cm   LV e' medial:    7.62 cm/s LV PW:         1.20 cm   LV E/e' medial:  10.2 LV IVS:        1.10 cm   LV e' lateral:   8.16 cm/s LVOT diam:     2.10 cm   LV E/e' lateral: 9.5 LV SV:         125 LV SV Index:   70 LVOT Area:     3.46 cm  RIGHT VENTRICLE RV Basal diam:  2.20 cm RV Mid diam:    2.00 cm RV S prime:     16.80 cm/s TAPSE (M-mode): 2.1 cm LEFT ATRIUM           Index        RIGHT ATRIUM          Index LA diam:      4.40 cm 2.48 cm/m   RA Area:     9.60 cm LA Vol (A2C): 41.1 ml 23.14 ml/m  RA Volume:   16.90 ml 9.52 ml/m LA Vol (A4C): 19.9 ml 11.20 ml/m  AORTIC VALVE AV Area (Vmax):    3.98 cm AV Area (Vmean):   4.03 cm AV Area (VTI):     4.33 cm AV Vmax:           167.00 cm/s AV Vmean:          117.000 cm/s AV VTI:            0.289 m AV Peak Grad:      11.2 mmHg AV Mean Grad:      6.5 mmHg LVOT Vmax:         192.00 cm/s LVOT Vmean:        136.000 cm/s LVOT VTI:          0.361 m LVOT/AV VTI ratio: 1.25  AORTA Ao Root diam: 3.70 cm MITRAL VALVE                TRICUSPID VALVE MV Area (PHT): 4.10 cm     TR Peak grad:   30.0 mmHg MV Area VTI:   5.04 cm     TR Vmax:        274.00 cm/s MV Peak grad:  4.8 mmHg MV Mean grad:  3.0 mmHg     SHUNTS MV Vmax:       1.09 m/s     Systemic VTI:  0.36 m MV Vmean:      77.8 cm/s    Systemic Diam: 2.10 cm MV Decel Time: 185 msec MV E velocity: 77.60 cm/s MV A velocity: 122.00 cm/s MV E/A ratio:  0.64 Julien Nordmann MD Electronically signed by Julien Nordmann MD Signature Date/Time: 08/16/2023/3:33:53 PM    Final    DG Naso G Tube Plc W/Fl W/Rad  Result Date: 08/16/2023 CLINICAL DATA:  Stroke. Dysphagia. Failed feeding tube placement on the floor. EXAM: NASO G TUBE PLACEMENT WITH FL AND WITH RAD CONTRAST:  None. FLUOROSCOPY: Radiation Exposure Index (as provided by the fluoroscopic device): 44.6 mGy Kerma COMPARISON:  None  Available. PROCEDURE/FINDINGS: The Dobbhoff feeding tube was lubricated with viscous lidocaine and inserted into the left nostril. Under fluoroscopic guidance, the feeding tube was advanced into stomach and duodenum, with the tip terminating near the junction of the second and third portions of the duodenum. The tube was affixed to the patient's nose with tape. The patient tolerated the procedure well without immediate postprocedural complication. IMPRESSION: Successful fluoroscopic guided placement of Dobbhoff feeding tube with tip in the duodenum. The tube is ready for immediate use. Electronically Signed   By: Obie Dredge M.D.   On: 08/16/2023 12:25   CT ANGIO HEAD NECK W WO CM Result Date: 08/15/2023 CLINICAL DATA:  Stroke follow-up EXAM: CT ANGIOGRAPHY HEAD AND NECK WITH AND WITHOUT CONTRAST TECHNIQUE: Multidetector CT imaging of the head and neck was performed using the standard protocol during bolus administration of intravenous contrast. Multiplanar CT image reconstructions and MIPs were obtained to evaluate the vascular anatomy. Carotid stenosis measurements (when applicable) are obtained utilizing NASCET criteria, using the distal internal carotid diameter as the denominator. RADIATION DOSE REDUCTION: This exam was performed according to the departmental dose-optimization program which includes automated exposure control, adjustment of the mA and/or kV according to patient size and/or use of iterative reconstruction technique. CONTRAST:  75mL OMNIPAQUE IOHEXOL 350 MG/ML SOLN COMPARISON:  08/12/2023 FINDINGS: Brain: Unchanged appearance of right PCA territory infarct. White matter hypoattenuation consistent with chronic small vessel disease. No acute intracranial hemorrhage. Mild volume loss. Streak artifacts from left-sided cochlear implant obscure parts of the left temporal and parietal lobes. Vascular: No hyperdense vessel or unexpected vascular calcification. Skull: The visualized skull base,  calvarium and extracranial soft tissues are normal. Sinuses/Orbits: Moderate right maxillary and frontal sinus mucosal thickening. Other: None. CTA NECK FINDINGS Skeleton: No acute abnormality or high grade bony spinal canal stenosis. Other neck: Heterogeneous and enlarged thyroid gland. Upper chest: Superior left lower lobe area of consolidation. Aortic arch: There is calcific atherosclerosis of the aortic arch. Conventional 3 vessel aortic branching pattern. RIGHT carotid system: No dissection, occlusion or aneurysm. Mild atherosclerotic calcification at the carotid bifurcation without hemodynamically significant stenosis. LEFT carotid system: No dissection, occlusion or aneurysm. Mild atherosclerotic calcification at the carotid bifurcation without hemodynamically significant stenosis. Vertebral arteries: Left dominant configuration. There is no dissection, occlusion or flow-limiting stenosis to the skull base (V1-V3 segments). CTA HEAD FINDINGS POSTERIOR CIRCULATION: Vertebral arteries are normal. No proximal occlusion of the anterior or inferior cerebellar arteries. Basilar artery is normal. Superior cerebellar arteries are normal. There is severe stenosis of the right PCA P2/P3 junction with possible short segment occlusion. Severe stenosis of the distal P3 segment of the left PCA. The left PCA is predominantly supplied by the posterior communicating artery. ANTERIOR CIRCULATION: Intracranial internal carotid arteries are normal. Anterior cerebral arteries are normal. Middle cerebral arteries are normal. Venous sinuses: As permitted by contrast timing, patent. Anatomic variants: None Review of the MIP images confirms the above findings. IMPRESSION: 1. 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 2. Severe stenosis of the distal P3 segment of the left PCA. 3. Unchanged appearance of right PCA territory infarct. 4. Superior left lower lobe area of consolidation,  concerning for pneumonia. Electronically Signed   By:  Deatra Robinson M.D.   On: 08/15/2023 22:56   DG Abd 1 View Result Date: 08/15/2023 CLINICAL DATA:  Check gastric catheter placement EXAM: ABDOMEN - 1 VIEW COMPARISON:  None Available. FINDINGS: Gastric catheter is noted with the tip extending into the left lower lobe. This should be withdrawn completely and readvanced. IMPRESSION: Gastric catheter within the left lower lobe bronchus. This should be completely withdrawn and readvanced. These results will be called to the ordering clinician or representative by the Radiologist Assistant, and communication documented in the PACS or [employer]. Electronically Signed   By: Alcide Clever M.D.   On: 08/15/2023 19:12        Scheduled Meds:  vitamin C  500 mg Per Tube BID   aspirin  81 mg Per Tube Daily   bisacodyl  10 mg Rectal QHS   carbidopa-levodopa  2 tablet Per NG tube TID   cyanocobalamin  1,000 mcg Per Tube Daily   doxazosin  1 mg Per Tube Daily   enoxaparin (LOVENOX) injection  40 mg Subcutaneous Q24H   escitalopram  5 mg Per Tube Daily   feeding supplement (PROSource TF20)  60 mL Per Tube Daily   free water  30 mL Per Tube Q4H   latanoprost  1 drop Both Eyes QHS   multivitamin with minerals  1 tablet Per Tube Daily   polyethylene glycol  17 g Per Tube BID   thiamine  100 mg Per Tube Daily   trimethoprim-polymyxin b  2 drop Both Eyes Q6H   zinc sulfate (50mg  elemental zinc)  220 mg Per Tube Daily   Continuous Infusions:  dextrose 40 mL/hr at 08/17/23 1045   feeding supplement (OSMOLITE 1.5 CAL) 50 mL/hr at 08/17/23 0500   levofloxacin (LEVAQUIN) IV Stopped (08/16/23 2036)     LOS: 4 days      Tresa Moore, MD [facility]   If 7PM-7AM, please contact night-coverage  08/17/2023, 1:00 PM

## 2023-08-17 NOTE — Plan of Care (Signed)

## 2023-08-17 NOTE — Progress Notes (Signed)
Palliative Care Progress Note, Assessment & Plan   Patient Name: Justin Bird       Date: 08/17/2023 DOB: 06/13/1941  Age: 83 y.o. MRN#: [id number] Attending Physician: Tresa Moore, MD Primary Care Physician: Pcp, No Admit Date: 08/12/2023  Subjective: Patient is lying in bed, awake and alert.  He acknowledges my presence and is able to track me with his eyes.  His friend/caregiver Justin Bird is at bedside during my visit.  No signs of distress noted.  HPI: 83 y.o. male  with past medical history of depression, Parkinson's tremors, and hard of hearing (cochlear implant) admitted on 08/12/2023 with AMS, fever, and hypoxemia.   Patient is being treated for sepsis secondary to UTI (5-day course of Levaquin), acute metabolic encephalopathy with concern for possible CVA (Seroquel stopped, Sinemet restarted via NG) for which neurology has been consulted, conjunctivitis (Polytrim OU 4 times daily), hypernatremia secondary to dehydration (IVF).   PMT was consulted to support patient with goals of care discussions.   Of note, patient and family are familiar to me as I worked with him during patient's previous extended hospitalization.  Summary of counseling/coordination of care: Extensive chart review completed prior to meeting patient including labs, vital signs, imaging, progress notes, orders, and available advanced directive documents from current and previous encounters.   After reviewing the patient's chart and assessing the patient at bedside, I spoke with patient and caregiver in regards to symptom management and goals of care.  Patient is able to shake his head yes and no but does not do it consistently during my visit.  He was able to confirm he is not in pain.  Caregiver also shared with me that  the patient does not like to be touched.  When caregiver shared this information, patient shook his head adamantly back and forth.  Shared this was positive to see him have more interaction and be more engaged.  No adjustment to [facility] needed at this time.  After visiting with the patient, I spoke with his son/HCPOA Justin Bird over the phone.  Brief medical update given.  Reviewed neurology's workup, remaining low albumin, need for PO intake once SLP is able to evaluate, and overall patient's current mental status.  Space and opportunity provided for Justin Bird to ask questions.  Discussed that continues to be a day by day thing evaluation of his swallow and mentation.  Reviewed treating the treatable and Justin Bird is in agreement with this plan.  No change to plan of care at this time.  PMT will continue to follow and support patient and family throughout his hospitalization.  Physical Exam Vitals reviewed.  Constitutional:      General: He is not in acute distress.    Appearance: He is normal weight.  HENT:     Head: Normocephalic.     Mouth/Throat:     Mouth: Mucous membranes are moist.  Eyes:     Comments: Mildly pink conjunctivae - improved from yesterday  Cardiovascular:     Pulses: Normal pulses.  Pulmonary:     Effort: Pulmonary effort is normal.  Abdominal:     Palpations: Abdomen is soft.  Skin:    General: Skin is warm and dry.  Neurological:  Mental Status: He is alert.  Psychiatric:        Mood and Affect: Mood normal.        Behavior: Behavior normal.             Total Time 35 minutes   Time spent includes: Detailed review of medical records (labs, imaging, vital signs), medically appropriate exam (mental status, respiratory, cardiac, skin), discussed with treatment team, counseling and educating patient, family and staff, documenting clinical information, medication management and coordination of care.  Samara Deist L. Bonita Quin, DNP, FNP-BC Palliative Medicine Team

## 2023-08-17 NOTE — Plan of Care (Signed)

## 2023-08-17 NOTE — Progress Notes (Signed)
Nutrition Follow-up  DOCUMENTATION CODES:   Non-severe (moderate) malnutrition in context of chronic illness  INTERVENTION:   -Continue TF via NGT:   Initiate Osmolite 1.5 @ 50 ml/hr    60 ml Prosource TF daily     30 free water flush every 4 hours   Tube feeding regimen provides 1880 kcal (100% of needs), 95 grams of protein, and 914 ml of H2O. Total free water: 10964 ml daily   -Monitor Mg, K, and Phos and replete as needed secondary to high feeding risk -Continue MVI with minerals daily via tube -100 mg thiamine daily via tube x 7 days -Pharmacy consulted for electrolyte management -500 mg vitamin C BID -220 mg zinc sulfate daily x 14 days  NUTRITION DIAGNOSIS:   Moderate Malnutrition related to chronic illness (Parkinson's) as evidenced by mild fat depletion, moderate fat depletion, percent weight loss, mild muscle depletion, moderate muscle depletion.  Ongoing  GOAL:   Patient will meet greater than or equal to 90% of their needs  Progressing  MONITOR:   Diet advancement, TF tolerance  REASON FOR ASSESSMENT:   Consult Enteral/tube feeding initiation and management  ASSESSMENT:   Pt with medical history significant of hard of hearing, depression, Parkinson's disease, allergy, who presents with altered mental status.  2/17- s/p BSE- NPO 2/18- s/p BSE- NPO, bedside NGT placement unsuccessful 2/19- s/p BSE- NPO, NGT placed by fluoroscopy (gastric), TF initiated  Reviewed I/O's: +2.1 L x 24 hours and +8.9 L since admission  Pt with enteral access for feeding via NGT: Osmolite 1.5 infusing at 50 ml/hr (goals rate).   Pharmacy has been consulted for electrolyte management.  No wt loss noted.  Palliative care following for goals of care; pt family reports pt would not to live on artifical means. Plan for DNR with limited interventions.   Medications reviewed and include dulcolax, sinemet, vitamin B-12, lovenox, miralax, and thiamine.   No results found  for: "HGBA1C" PTA DM medications are .   Labs reviewed: Na: 146, K, and Mg WDL. Phos: 2.3, CBGS: 132-161 (inpatient orders for glycemic control are none).    Diet Order:   Diet Order             Diet NPO time specified  Diet effective now                   EDUCATION NEEDS:   Not appropriate for education at this time  Skin:  Skin Assessment: Skin Integrity Issues: Skin Integrity Issues:: Other (Comment), Stage II Stage II: buttocks Other: skin tear to lt hand  Last BM:  08/15/23  Height:   Ht Readings from Last 1 Encounters:  08/13/23 5\' 5"  (1.651 m)    Weight:   Wt Readings from Last 1 Encounters:  08/16/23 70.4 kg    Ideal Body Weight:  61.8 kg  BMI:  Body mass index is 25.83 kg/m.  Estimated Nutritional Needs:   Kcal:  1850-2050  Protein:  90-105 grams  Fluid:  > 1.8 L    Levada Schilling, RD, LDN, CDCES Registered Dietitian III Certified Diabetes Care and Education Specialist If unable to reach this RD, please use "RD Inpatient" group chat on secure chat between hours of 8am-4 pm daily

## 2023-08-17 NOTE — Progress Notes (Signed)
SLP Cancellation Note  Patient Details Name: Justin Bird MRN: [id number] DOB: 03/08/1941   Cancelled treatment:       Reason Eval/Treat Not Completed: Medical issues which prohibited therapy (Per chart review, no appreciable change in mental status which would lend itself for improvements in oropharyngeal swallow function. Will plan to re-assess pt next date, as appropriate. Per chart, pt now with DHT which was placed under fluoro, 2/19.)  Clyde Canterbury, M.S., CCC-SLP Speech-Language Pathologist [facility] [phone] Arnette Felts)  Woodroe Chen 08/17/2023, 10:12 AM

## 2023-08-17 NOTE — TOC Initial Note (Signed)
Transition of Care [facility]) - Discharge Note   Patient Details  Name: Justin Bird MRN: [id number] Date of Birth: 07/30/1940  Transition of Care [facility]) CM/SW Contact:  Garret Reddish, RN Phone Number: 08/17/2023, 3:25 PM   Clinical Narrative:    Chart reviewed.  Noted that patient was admitted with Sepsis secondary due to UTI. Patient is currently on IV Levaquin.  Neurology also consulted for stroke work-up.  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  Theodoro Grist reports that patient is able to return when stable for discharge.    TOC will continue to follow for discharge planning.      Final next level of care:  (To be determined.) Barriers to Discharge: No Barriers Identified   Patient Goals and CMS Choice   CMS Medicare.gov Compare Post Acute Care list provided to:: Patient Represenative (must comment) (Patient's son [name] [facility])) Choice offered to / list presented to : Adult Children      Discharge Placement                       Discharge Plan and Services Additional resources added to the After Visit Summary for     Discharge Planning Services: CM Consult Post Acute Care Choice: Nursing Home                               Social Drivers of Health (SDOH) Interventions SDOH Screenings   Food Insecurity: Patient Unable To Answer (08/14/2023)  Housing: Patient Unable To Answer (08/14/2023)  Transportation Needs: Patient Unable To Answer (08/14/2023)  Utilities: Patient Unable To Answer (08/14/2023)  Financial Resource Strain: Patient Declined (01/04/2023)   Received from [facility]  Social Connections: Patient Unable To Answer (08/14/2023)  Tobacco Use: Low Risk  (08/12/2023)     Readmission Risk Interventions     No data to display

## 2023-08-17 NOTE — Consult Note (Addendum)
PHARMACY CONSULT NOTE - FOLLOW UP  Pharmacy Consult for Electrolyte Monitoring and Replacement   Recent Labs: Potassium (mmol/L)  Date Value  08/17/2023 3.7   Magnesium (mg/dL)  Date Value  16/03/9603 2.3   Calcium (mg/dL)  Date Value  54/02/8118 7.7 (L)   Albumin (g/dL)  Date Value  14/78/2956 2.0 (L)   Phosphorus (mg/dL)  Date Value  21/30/8657 2.3 (L)   Sodium (mmol/L)  Date Value  08/17/2023 146 (H)   Corrected Ca: 9.3  Assessment: 83 y.o. 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  Recent hospital course was complicated by urinary retention.  He has been nonverbal since his last hospitalization.  He presents on this admission with worsening mental status, fever, hypoxemia   MIVF: D5@40  ml/hr Diet: Currently NPO Free water 30 ml q4H   Goal of Therapy:  WNL  Plan: No electrolyte replacement currently warranted F/U BMP, Phos with AM labs  Will M. Dareen Piano, PharmD Clinical Pharmacist 08/17/2023 10:19 AM\

## 2023-08-18 ENCOUNTER — Inpatient Hospital Stay: Payer: Medicare Other

## 2023-08-18 DIAGNOSIS — Z515 Encounter for palliative care: Secondary | ICD-10-CM | POA: Diagnosis not present

## 2023-08-18 DIAGNOSIS — A419 Sepsis, unspecified organism: Secondary | ICD-10-CM | POA: Diagnosis not present

## 2023-08-18 DIAGNOSIS — H903 Sensorineural hearing loss, bilateral: Secondary | ICD-10-CM | POA: Diagnosis not present

## 2023-08-18 DIAGNOSIS — N390 Urinary tract infection, site not specified: Secondary | ICD-10-CM | POA: Diagnosis not present

## 2023-08-18 DIAGNOSIS — H1033 Unspecified acute conjunctivitis, bilateral: Secondary | ICD-10-CM | POA: Diagnosis not present

## 2023-08-18 LAB — CBC WITH DIFFERENTIAL/PLATELET
Abs Immature Granulocytes: 0.22 10*3/uL — ABNORMAL HIGH (ref 0.00–0.07)
Basophils Absolute: 0.1 10*3/uL (ref 0.0–0.1)
Basophils Relative: 1 %
Eosinophils Absolute: 0.5 10*3/uL (ref 0.0–0.5)
Eosinophils Relative: 5 %
HCT: 40.2 % (ref 39.0–52.0)
Hemoglobin: 13 g/dL (ref 13.0–17.0)
Immature Granulocytes: 3 %
Lymphocytes Relative: 16 %
Lymphs Abs: 1.4 10*3/uL (ref 0.7–4.0)
MCH: 29.5 pg (ref 26.0–34.0)
MCHC: 32.3 g/dL (ref 30.0–36.0)
MCV: 91.2 fL (ref 80.0–100.0)
Monocytes Absolute: 0.8 10*3/uL (ref 0.1–1.0)
Monocytes Relative: 9 %
Neutro Abs: 5.8 10*3/uL (ref 1.7–7.7)
Neutrophils Relative %: 66 %
Platelets: 228 10*3/uL (ref 150–400)
RBC: 4.41 MIL/uL (ref 4.22–5.81)
RDW: 13.5 % (ref 11.5–15.5)
WBC: 8.7 10*3/uL (ref 4.0–10.5)
nRBC: 0 % (ref 0.0–0.2)

## 2023-08-18 LAB — COMPREHENSIVE METABOLIC PANEL
ALT: 19 U/L (ref 0–44)
AST: 42 U/L — ABNORMAL HIGH (ref 15–41)
Albumin: 2.1 g/dL — ABNORMAL LOW (ref 3.5–5.0)
Alkaline Phosphatase: 49 U/L (ref 38–126)
Anion gap: 9 (ref 5–15)
BUN: 14 mg/dL (ref 8–23)
CO2: 22 mmol/L (ref 22–32)
Calcium: 7.8 mg/dL — ABNORMAL LOW (ref 8.9–10.3)
Chloride: 110 mmol/L (ref 98–111)
Creatinine, Ser: 0.44 mg/dL — ABNORMAL LOW (ref 0.61–1.24)
GFR, Estimated: >60 mL/min (ref >60)
Glucose, Bld: 148 mg/dL — ABNORMAL HIGH (ref 70–99)
Potassium: 5.3 mmol/L — ABNORMAL HIGH (ref 3.5–5.1)
Sodium: 141 mmol/L (ref 135–145)
Total Bilirubin: 1 mg/dL (ref 0.0–1.2)
Total Protein: 4.7 g/dL — ABNORMAL LOW (ref 6.5–8.1)

## 2023-08-18 LAB — GLUCOSE, CAPILLARY
Glucose-Capillary: 109 mg/dL — ABNORMAL HIGH (ref 70–99)
Glucose-Capillary: 135 mg/dL — ABNORMAL HIGH (ref 70–99)
Glucose-Capillary: 142 mg/dL — ABNORMAL HIGH (ref 70–99)
Glucose-Capillary: 145 mg/dL — ABNORMAL HIGH (ref 70–99)
Glucose-Capillary: 146 mg/dL — ABNORMAL HIGH (ref 70–99)
Glucose-Capillary: 151 mg/dL — ABNORMAL HIGH (ref 70–99)

## 2023-08-18 LAB — PHOSPHORUS: Phosphorus: 2.5 mg/dL (ref 2.5–4.6)

## 2023-08-18 LAB — CULTURE, BLOOD (ROUTINE X 2): Culture: NO GROWTH

## 2023-08-18 LAB — MAGNESIUM: Magnesium: 2.3 mg/dL (ref 1.7–2.4)

## 2023-08-18 NOTE — Progress Notes (Signed)
Speech Language Pathology Treatment: Dysphagia  Patient Details Name: Justin Bird MRN: [id number] DOB: [birth year] Today's Date: 08/18/2023 Time: 8295-6213 SLP Time Calculation (min) (ACUTE ONLY): 45 min  Assessment / Plan / Recommendation Clinical Impression  Pt seen for ongoing assessment of swallowing and trials to upgrade to an oral diet if appropriate today. Pt lying in bed w/ eyes closed but opened/alerted w/ verbal/tactile/visual stim. Few phonations intermittently but nonverbal. He looked to this SLP when his name was called and followed through w/ po tasks given light stim at lips from spoon. He exhibited min oral confusion/defensiveness to oral care but allowed the oral care. Baseline Dementia, Parkinson's Dis. Confusion but less agitation noted w/ oral care and more engagement overall today. Noted per chart, the plan is to "continue with Sinemet and to reevaluate early next week to see if his mentation has improved" and oral intake can be initiated.   No Family present.  On [place] o2 2-3L; afebrile. WBC WNL.   Pt appears to present w/ MOD-SEVERE oropharyngeal phase dysphagia in setting of declined Cognitive status; declined mental status. Pt has Baseline Dementia; Parkinson's Dis per chart notes. ANY Cognitive decline can impact overall awareness/timing of swallow and safety during po tasks which increases risk for aspiration, choking. Pt is at HIGH risk for aspiration/aspiration pneumonia w/ oral intake at this time. Pt required MOD-MAX cues to follow through w/ po tasks, oral care -- though much improved from 2 days ago. Pt engaged more appropriately w/ increased awareness in po trials presented today. NGT present.        Pt was given oral care then small tsp trials of ice chips(single) and Nectar liquids. Pt exhibited improved oral awareness w/ more timely opening of mouth for bolus acceptance. W/ small tsp trials of single ice chip and Puree placed anteriorly in mouth, labial  seal/closure noted on utensil to accept boluses. Bolus management and control noted w/ lingual movements for bolus A-P transfer/swallowing. Pharyngeal swallowing appreciated w/ fair+ laryngeal excursion. Pharyngeal swallowing appeared timely. No overt coughing occurred. Oral clearing of Nectar liquids noted; no anterior leakage nor oral residue occurred. Pt consumed ~3 ozs of Nectar liquids for intake.     In setting of pt's presentation at this time, recommend continue NPO status w/ frequent oral care for hygiene and stimulation of swallowing. Aspiration precautions. ST services will f/u w/ ongoing assessment of swallowing and therapeutic po trials in attempt to establish a least restrictive po diet.   NSG/MD updated on pt's status and presentation; POC. Palliative Care is following for GOC, support.      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 He was recently hospitalized for toxic metabolic encephalopathy  1/2-08/03/23. His course was complicated by urinary retention. Also noted were increased Meds for Agitation/behavior during the lengthy admission. Per chart notes, "patient had been doing well until family abruptly discontinued his Sinemet for unclear reasons, and he subsequently developed confusion/altered mental status for which he presented to ED. In the ED, MDs assessed pt d/t his increased Agitation indicating concern of "diagnosis to include early stage of Dementia, delirium, vitamin B12 deficiency and abnormal thyroid function. Patient has possibly missed some dose of Sinemet, which may have contributed partially due to withdrawal. Patient received IV Ativan, IV Haldol, IM Zyprexa in ED.". Pt had a Caregiver caring for him in the home prior to  recent admissions.     He presents to [facility]-ED this admit for worsening mental status, fever and hypoxemia and  dx'd w/ Sepsis.   CXR this admit: No acute cardiopulmonary abnormality.  2. Leftward deviation of the superior trachea. 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      SLP Plan  Continue with current plan of care      Recommendations for follow up therapy are one component of a multi-disciplinary discharge planning process, led by the attending physician.  Recommendations may be updated based on patient status, additional functional criteria and insurance authorization.    Recommendations  Diet recommendations: NPO (ongoing assessment) Medication Administration: Via alternative means (NGT)                 (Palliative Care for GOC; Dietician) Oral care QID;Staff/trained caregiver to provide oral care   Frequent or constant Supervision/Assistance Dysphagia, oropharyngeal phase (R13.12) (baseline Dementia)     Continue with current plan of care       Jerilynn Som, MS, CCC-SLP Speech Language Pathologist Rehab Services; [facility] [phone] (ascom) Jon Kasparek  08/18/2023, 5:29 PM

## 2023-08-18 NOTE — Progress Notes (Signed)
PROGRESS NOTE    Justin Bird  ZOX:[id number] DOB: [birth year] DOA: 08/12/2023 PCP: Pcp, No    Brief Narrative:   Justin Bird is 83 y.o. 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  Recent hospital course was complicated by urinary retention.  He has been nonverbal since his last hospitalization.  He presents on this admission with worsening mental status, fever, hypoxemia On arrival to the ED Tmax 101, BP 87/45 which improved with fluid resuscitation.  Labs reveal mild lactic acidosis, hypernatremia 158, chloride 120, glucose 138, WBC 15.4, hemoglobin 17.6.  CT head reveals chronic occipital infarct as well as right frontal, ethmoid and maxillary sinus disease.  He received 2 L LR, 500 cc NS, aztreonam, vancomycin, Flagyl.  TRH admitted the patient for sepsis.    Assessment & Plan:   Principal Problem:   Sepsis secondary to UTI [facility]) Active Problems:   Acute metabolic encephalopathy   SNHL (sensory-neural hearing loss), asymmetrical   Conjunctivitis   Parkinson's disease (HCC)   Malnutrition of moderate degree  Sepsis - Criteria met on arrival with: Fever 101, hypotensive, altered mental status, leukocytosis -- Presumed urinary source, CXR showed no acute cardiopulmonary disease - Status post fluid resuscitation.  Blood pressure has improved -- Urine culture yields 40,000 colonies of gram-positive cocci, Aerococcus.  Unclear whether this represents true infection Plan: Completing course of Levaquin today.  No indication to resume antibiotics.  Continue to monitor vitals and fever curve.      Acute metabolic encephalopathy Concern for possible CVA - Likely has baseline dementia exacerbated by hearing loss, hypernatremia and sepsis - Patient remains lethargic and has been evaluated by speech  therapy --Suspect this is multifactorial secondary to rapid discontinuation of Sinemet, infection as above, high dose atypical antipsychotics. --Low suspicion this patient truly did have a recurrent stroke however we are unable to do MRI due to presence of cochlear implant Plan: Continue NPO.  Continue NG tube in place with tube feedings.  Sinemet 3 times daily via tube.  Seroquel remains on hold.    Conjunctivitis - Also has purulent discharge bilaterally - Continue with Polytrim OU 4 times daily -- Improved over interval     Sensorineural hearing loss - Chronic, has had prior cochlear implant but this has since been lost. - Needs assistance with communication --Caregiver brought in hearing aids   Parkinson's disease - Diagnosis is somewhat in question.  Appears that Sinemet was initiated for tremors.  In any case the patient now needs Sinemet.  He has NG tube in place and Sinemet can be crushed     Hypernatremia Appears secondary to dehydration/intravascular volume depletion.  Sodium improved to 141 on 2/21.  Discontinue D5 water.  Continue free water flushes via NG tube.  Unclear baseline - Patient recently had prolonged hospital stay for sepsis, and his son reports during that time he was heavily sedated.  He comes to [place] now from [facility] where he is on 100 mg Seroquel 3 times daily and trazodone 150 mg.   Trazodone has been discontinued and  Seroquel has been changed to as needed since patient is sedated     Poor prognosis - Patient's age, comorbidities, and recent hospitalization for toxic metabolic encephalopathy with repeat admission not long after discharge is concerning.  Overall prognosis guarded..  Patient is DNR status.  Appreciate palliative care involvement in care    DVT  prophylaxis: SQ Lovenox Code Status: DNR Family Communication: Daughter [name] [phone] 2/19, voicemail left on 2/20 Disposition Plan: Status is: Inpatient Remains inpatient  appropriate because: Metabolic encephalopathy of unclear etiology   Level of care: Med-Surg  Consultants:  Palliative care  Procedures:  NG tube  Antimicrobials: Levaquin   Subjective: Seen and examined.  No family numbers at bedside today.  Patient grunting weakly in response to questions.  Objective: Vitals:   08/17/23 1800 08/17/23 2352 08/18/23 0600 08/18/23 0700  BP: 121/77 (!) 112/54  (!) 140/67  Pulse: 86 99  87  Resp: 16 19  18   Temp:  98.1 F (36.7 C)  98 F (36.7 C)  TempSrc:  Oral  Oral  SpO2:  97%  100%  Weight:   70.9 kg   Height:        Intake/Output Summary (Last 24 hours) at 08/18/2023 1047 Last data filed at 08/18/2023 0424 Gross per 24 hour  Intake 849.93 ml  Output --  Net 849.93 ml   Filed Weights   08/13/23 2100 08/16/23 0500 08/18/23 0600  Weight: 67.8 kg 70.4 kg 70.9 kg    Examination:  General exam: Chronically ill-appearing Respiratory system: Bibasilar crackles.  Normal work of breathing.  Room air Cardiovascular system: S1-S2, RRR, no murmurs, no pedal edema Gastrointestinal system: Soft, NT/ND, normal bowel sounds Central nervous system: Lethargic.  Awake.  Aphasic.  Unable to assess orientation Extremities: Decreased power symmetrically.  Unable to ambulate Skin: No rashes, lesions or ulcers Psychiatry: Unable to assess    Data Reviewed: I have personally reviewed following labs and imaging studies  CBC: Recent Labs  Lab 08/14/23 0434 08/15/23 0508 08/16/23 0521 08/17/23 0510 08/18/23 0828  WBC 19.3* 14.2* 9.8 7.8 8.7  NEUTROABS 15.8* 11.8* 7.6 5.7 5.8  HGB 14.7 13.4 13.3 13.0 13.0  HCT 49.3 43.5 42.3 43.6 40.2  MCV 99.2 95.8 94.2 98.4 91.2  PLT 219 212 227 156 228   Basic Metabolic Panel: Recent Labs  Lab 08/14/23 1809 08/15/23 0508 08/15/23 1932 08/16/23 0521 08/16/23 1714 08/17/23 0510 08/18/23 0532  NA 152* 154*  --  151*  --  146* 141  K 2.9* 3.0*  --  3.2* 3.3* 3.7 5.3*  CL 117* 119*  --  120*  --   118* 110  CO2 27 27  --  24  --  23 22  GLUCOSE 129* 109*  --  92  --  142* 148*  BUN 26* 24*  --  20  --  18 14  CREATININE 0.44* 0.43*  --  0.39*  --  0.39* 0.44*  CALCIUM 8.2* 8.1*  --  8.0*  --  7.7* 7.8*  MG  --  2.2 2.3 2.3  --  2.3 2.3  PHOS  --  1.7* 1.9* 2.5  --  2.3* 2.5   GFR: Estimated Creatinine Clearance: 63 mL/min (A) (by C-G formula based on SCr of 0.44 mg/dL (L)). Liver Function Tests: Recent Labs  Lab 08/14/23 0434 08/15/23 0508 08/16/23 0521 08/17/23 0510 08/18/23 0532  AST 29 23 30 31  42*  ALT 47* 39 34 17 19  ALKPHOS 54 50 49 47 49  BILITOT 0.9 0.8 0.4 0.5 1.0  PROT 5.3* 5.0* 5.1* 4.5* 4.7*  ALBUMIN 2.5* 2.2* 2.1* 2.0* 2.1*   No results for input(s): "LIPASE", "AMYLASE" in the last 168 hours. No results for input(s): "AMMONIA" in the last 168 hours. Coagulation Profile: No results for input(s): "INR", "PROTIME" in the last 168 hours. Cardiac  Enzymes: No results for input(s): "CKTOTAL", "CKMB", "CKMBINDEX", "TROPONINI" in the last 168 hours. BNP (last 3 results) No results for input(s): "PROBNP" in the last 8760 hours. HbA1C: No results for input(s): "HGBA1C" in the last 72 hours. CBG: Recent Labs  Lab 08/17/23 1617 08/17/23 1957 08/18/23 0017 08/18/23 0405 08/18/23 0816  GLUCAP 134* 122* 145* 142* 151*   Lipid Profile: No results for input(s): "CHOL", "HDL", "LDLCALC", "TRIG", "CHOLHDL", "LDLDIRECT" in the last 72 hours. Thyroid Function Tests: No results for input(s): "TSH", "T4TOTAL", "FREET4", "T3FREE", "THYROIDAB" in the last 72 hours.  Anemia Panel: No results for input(s): "VITAMINB12", "FOLATE", "FERRITIN", "TIBC", "IRON", "RETICCTPCT" in the last 72 hours. Sepsis Labs: Recent Labs  Lab 08/12/23 1428 08/12/23 1843 08/12/23 2202 08/16/23 0521  PROCALCITON  --   --   --  0.11  LATICACIDVEN 2.3* 3.3* 2.5*  --     Recent Results (from the past 240 hours)  Blood Culture (routine x 2)     Status: None   Collection Time: 08/12/23   2:23 PM   Specimen: BLOOD  Result Value Ref Range Status   Specimen Description BLOOD BLOOD LEFT ARM  Final   Special Requests   Final    BOTTLES DRAWN AEROBIC AND ANAEROBIC Blood Culture adequate volume   Culture   Final    NO GROWTH 5 DAYS Performed at [facility], [address] [place], [place] [address]    Report Status 08/17/2023 FINAL  Final  Resp panel by RT-PCR (RSV, Flu A&B, Covid) Anterior Nasal Swab     Status: None   Collection Time: 08/12/23  2:28 PM   Specimen: Anterior Nasal Swab  Result Value Ref Range Status   SARS Coronavirus 2 by RT PCR NEGATIVE NEGATIVE Final    Comment: (NOTE) SARS-CoV-2 target nucleic acids are NOT DETECTED.  The SARS-CoV-2 RNA is generally detectable in upper respiratory specimens during the acute phase of infection. The lowest concentration of SARS-CoV-2 viral copies this assay can detect is 138 copies/mL. A negative result does not preclude SARS-Cov-2 infection and should not be used as the sole basis for treatment or other patient management decisions. A negative result may occur with  improper specimen collection/handling, submission of specimen other than nasopharyngeal swab, presence of viral mutation(s) within the areas targeted by this assay, and inadequate number of viral copies(<138 copies/mL). A negative result must be combined with clinical observations, patient history, and epidemiological information. The expected result is Negative.  Fact Sheet for Patients:  BloggerCourse.com  Fact Sheet for Healthcare Providers:  SeriousBroker.it  This test is no t yet approved or cleared by the [place] FDA and  has been authorized for detection and/or diagnosis of SARS-CoV-2 by FDA under an Emergency Use Authorization (EUA). This EUA will remain  in effect (meaning this test can be used) for the duration of the COVID-19 declaration under Section 564(b)(1) of the Act,  21 U.S.C.section 360bbb-3(b)(1), unless the authorization is terminated  or revoked sooner.       Influenza A by PCR NEGATIVE NEGATIVE Final   Influenza B by PCR NEGATIVE NEGATIVE Final    Comment: (NOTE) The Xpert Xpress SARS-CoV-2/FLU/RSV plus assay is intended as an aid in the diagnosis of influenza from Nasopharyngeal swab specimens and should not be used as a sole basis for treatment. Nasal washings and aspirates are unacceptable for Xpert Xpress SARS-CoV-2/FLU/RSV testing.  Fact Sheet for Patients: BloggerCourse.com  Fact Sheet for Healthcare Providers: SeriousBroker.it  This test is not yet approved  or cleared by the [place] and has been authorized for detection and/or diagnosis of SARS-CoV-2 by FDA under an Emergency Use Authorization (EUA). This EUA will remain in effect (meaning this test can be used) for the duration of the COVID-19 declaration under Section 564(b)(1) of the Act, 21 U.S.C. section 360bbb-3(b)(1), unless the authorization is terminated or revoked.     Resp Syncytial Virus by PCR NEGATIVE NEGATIVE Final    Comment: (NOTE) Fact Sheet for Patients: BloggerCourse.com  Fact Sheet for Healthcare Providers: SeriousBroker.it  This test is not yet approved or cleared by the [place] FDA and has been authorized for detection and/or diagnosis of SARS-CoV-2 by FDA under an Emergency Use Authorization (EUA). This EUA will remain in effect (meaning this test can be used) for the duration of the COVID-19 declaration under Section 564(b)(1) of the Act, 21 U.S.C. section 360bbb-3(b)(1), unless the authorization is terminated or revoked.  Performed at [facility], [address] [place], [place] [address]   Urine Culture     Status: Abnormal   Collection Time: 08/12/23  8:55 PM   Specimen: Urine, Random  Result Value Ref Range Status    Specimen Description   Final    URINE, RANDOM Performed at [facility], [address], [place], [place] [address]    Special Requests   Final    NONE Reflexed from [phone] Performed at [facility], [address] [place], [place] [address]    Culture (A)  Final    40,000 COLONIES/mL AEROCOCCUS URINAE Standardized susceptibility testing for this organism is not available. Performed at [facility], [address], [place], [place] [address]    Report Status 08/16/2023 FINAL  Final  Culture, blood (Routine X 2) w Reflex to ID Panel     Status: None   Collection Time: 08/13/23  9:04 PM   Specimen: BLOOD  Result Value Ref Range Status   Specimen Description BLOOD BLOOD RIGHT HAND BLOOD RIGHT ARM  Final   Special Requests   Final    Blood Culture results may not be optimal due to an inadequate volume of blood received in culture bottles   Culture   Final    NO GROWTH 5 DAYS Performed at [facility], [address], [place], [place] [address]    Report Status 08/18/2023 FINAL  Final  MRSA Next Gen by PCR, Nasal     Status: None   Collection Time: 08/14/23  4:00 AM   Specimen: Nasal Mucosa; Nasal Swab  Result Value Ref Range Status   MRSA by PCR Next Gen NOT DETECTED NOT DETECTED Final    Comment: (NOTE) The GeneXpert MRSA Assay (FDA approved for NASAL specimens only), is one component of a comprehensive MRSA colonization surveillance program. It is not intended to diagnose MRSA infection nor to guide or monitor treatment for MRSA infections. Test performance is not FDA approved in patients less than 2 years old. Performed at [facility], [address] [place], [place] [address]          Radiology Studies: DG Abd 1 View Result Date: 08/17/2023 CLINICAL DATA:  [id number] Nasogastric tube present [id number] EXAM: ABDOMEN - 1 VIEW COMPARISON:  x-ray abdomen 08/15/2023 FINDINGS: Patient is rotated. Enteric tube coursing below the  hemidiaphragm. Tip likely within the fourth portion of the duodenum. The bowel gas pattern is normal. No radio-opaque calculi or other significant radiographic abnormality are seen. IMPRESSION: Enteric tube with tip likely within the  fourth portion of the duodenum. Slightly limited evaluation due to patient rotation. Electronically Signed   By: Tish Frederickson M.D.   On: 08/17/2023 18:20   ECHOCARDIOGRAM COMPLETE Result Date: 08/16/2023    ECHOCARDIOGRAM REPORT   Patient Name:   HAJI DELAINE Date of Exam: 08/16/2023 Medical Rec #:  [id number]         Height:       65.0 in Accession #:    [id number]        Weight:       155.2 lb Date of Birth:  03-19-1941        BSA:          1.776 m Patient Age:    82 years          BP:           116/60 mmHg Patient Gender: M                 HR:           77 bpm. Exam Location:  [facility] Procedure: 2D Echo, Cardiac Doppler, Color Doppler and Saline Contrast Bubble            Study (Both Spectral and Color Flow Doppler were utilized during            procedure). Indications:     Stroke I63.9  History:         Patient has no prior history of Echocardiogram examinations.                  Parkiinsons.  Sonographer:     Cristela Blue Referring Phys:  [id number] AGBATA Diagnosing Phys: Julien Nordmann MD  Sonographer Comments: Suboptimal parasternal window. IMPRESSIONS  1. Left ventricular ejection fraction, by estimation, is 60 to 65%. The left ventricle has normal function. The left ventricle has no regional wall motion abnormalities. There is mild left ventricular hypertrophy. Left ventricular diastolic parameters are consistent with Grade I diastolic dysfunction (impaired relaxation).  2. Right ventricular systolic function is normal. The right ventricular size is normal. There is normal pulmonary artery systolic pressure. The estimated right ventricular systolic pressure is 35.0 mmHg.  3. A small pericardial effusion is present.  4. The mitral valve is normal in structure. No  evidence of mitral valve regurgitation. No evidence of mitral stenosis.  5. The aortic valve is normal in structure. There is mild calcification of the aortic valve. Aortic valve regurgitation is not visualized. Aortic valve sclerosis/calcification is present, without any evidence of aortic stenosis. Aortic valve mean gradient measures 6.5 mmHg.  6. The inferior vena cava is normal in size with greater than 50% respiratory variability, suggesting right atrial pressure of 3 mmHg. FINDINGS  Left Ventricle: Left ventricular ejection fraction, by estimation, is 60 to 65%. The left ventricle has normal function. The left ventricle has no regional wall motion abnormalities. Strain imaging was not performed. The left ventricular internal cavity  size was normal in size. There is mild left ventricular hypertrophy. Left ventricular diastolic parameters are consistent with Grade I diastolic dysfunction (impaired relaxation). Right Ventricle: The right ventricular size is normal. No increase in right ventricular wall thickness. Right ventricular systolic function is normal. There is normal pulmonary artery systolic pressure. The tricuspid regurgitant velocity is 2.74 m/s, and  with an assumed right atrial pressure of 5 mmHg, the estimated right ventricular systolic pressure is 35.0 mmHg. Left Atrium: Left atrial size was normal in size. Right Atrium: Right atrial size was normal  in size. Pericardium: A small pericardial effusion is present. Mitral Valve: The mitral valve is normal in structure. No evidence of mitral valve regurgitation. No evidence of mitral valve stenosis. MV peak gradient, 4.8 mmHg. The mean mitral valve gradient is 3.0 mmHg. Tricuspid Valve: The tricuspid valve is normal in structure. Tricuspid valve regurgitation is not demonstrated. No evidence of tricuspid stenosis. Aortic Valve: The aortic valve is normal in structure. There is mild calcification of the aortic valve. Aortic valve regurgitation is not  visualized. Aortic valve sclerosis/calcification is present, without any evidence of aortic stenosis. Aortic valve mean gradient measures 6.5 mmHg. Aortic valve peak gradient measures 11.2 mmHg. Aortic valve area, by VTI measures 4.33 cm. Pulmonic Valve: The pulmonic valve was normal in structure. Pulmonic valve regurgitation is not visualized. No evidence of pulmonic stenosis. Aorta: The aortic root is normal in size and structure. Venous: The inferior vena cava is normal in size with greater than 50% respiratory variability, suggesting right atrial pressure of 3 mmHg. IAS/Shunts: No atrial level shunt detected by color flow Doppler. Agitated saline contrast was given intravenously to evaluate for intracardiac shunting. Additional Comments: 3D imaging was not performed.  LEFT VENTRICLE PLAX 2D LVIDd:         4.10 cm   Diastology LVIDs:         2.30 cm   LV e' medial:    7.62 cm/s LV PW:         1.20 cm   LV E/e' medial:  10.2 LV IVS:        1.10 cm   LV e' lateral:   8.16 cm/s LVOT diam:     2.10 cm   LV E/e' lateral: 9.5 LV SV:         125 LV SV Index:   70 LVOT Area:     3.46 cm  RIGHT VENTRICLE RV Basal diam:  2.20 cm RV Mid diam:    2.00 cm RV S prime:     16.80 cm/s TAPSE (M-mode): 2.1 cm LEFT ATRIUM           Index        RIGHT ATRIUM          Index LA diam:      4.40 cm 2.48 cm/m   RA Area:     9.60 cm LA Vol (A2C): 41.1 ml 23.14 ml/m  RA Volume:   16.90 ml 9.52 ml/m LA Vol (A4C): 19.9 ml 11.20 ml/m  AORTIC VALVE AV Area (Vmax):    3.98 cm AV Area (Vmean):   4.03 cm AV Area (VTI):     4.33 cm AV Vmax:           167.00 cm/s AV Vmean:          117.000 cm/s AV VTI:            0.289 m AV Peak Grad:      11.2 mmHg AV Mean Grad:      6.5 mmHg LVOT Vmax:         192.00 cm/s LVOT Vmean:        136.000 cm/s LVOT VTI:          0.361 m LVOT/AV VTI ratio: 1.25  AORTA Ao Root diam: 3.70 cm MITRAL VALVE                TRICUSPID VALVE MV Area (PHT): 4.10 cm     TR Peak grad:   30.0 mmHg MV Area VTI:  5.04 cm      TR Vmax:        274.00 cm/s MV Peak grad:  4.8 mmHg MV Mean grad:  3.0 mmHg     SHUNTS MV Vmax:       1.09 m/s     Systemic VTI:  0.36 m MV Vmean:      77.8 cm/s    Systemic Diam: 2.10 cm MV Decel Time: 185 msec MV E velocity: 77.60 cm/s MV A velocity: 122.00 cm/s MV E/A ratio:  0.64 Julien Nordmann MD Electronically signed by Julien Nordmann MD Signature Date/Time: 08/16/2023/3:33:53 PM    Final    DG Naso G Tube Plc W/Fl W/Rad Result Date: 08/16/2023 CLINICAL DATA:  Stroke. Dysphagia. Failed feeding tube placement on the floor. EXAM: NASO G TUBE PLACEMENT WITH FL AND WITH RAD CONTRAST:  None. FLUOROSCOPY: Radiation Exposure Index (as provided by the fluoroscopic device): 44.6 mGy Kerma COMPARISON:  None Available. PROCEDURE/FINDINGS: The Dobbhoff feeding tube was lubricated with viscous lidocaine and inserted into the left nostril. Under fluoroscopic guidance, the feeding tube was advanced into stomach and duodenum, with the tip terminating near the junction of the second and third portions of the duodenum. The tube was affixed to the patient's nose with tape. The patient tolerated the procedure well without immediate postprocedural complication. IMPRESSION: Successful fluoroscopic guided placement of Dobbhoff feeding tube with tip in the duodenum. The tube is ready for immediate use. Electronically Signed   By: Obie Dredge M.D.   On: 08/16/2023 12:25        Scheduled Meds:  vitamin C  500 mg Per Tube BID   aspirin  81 mg Per Tube Daily   bisacodyl  10 mg Rectal QHS   carbidopa-levodopa  2 tablet Per NG tube TID   cyanocobalamin  1,000 mcg Per Tube Daily   doxazosin  1 mg Per Tube Daily   enoxaparin (LOVENOX) injection  40 mg Subcutaneous Q24H   escitalopram  5 mg Per Tube Daily   feeding supplement (PROSource TF20)  60 mL Per Tube Daily   free water  30 mL Per Tube Q4H   latanoprost  1 drop Both Eyes QHS   multivitamin with minerals  1 tablet Per Tube Daily   polyethylene glycol  17 g  Per Tube BID   thiamine  100 mg Per Tube Daily   trimethoprim-polymyxin b  2 drop Both Eyes Q6H   zinc sulfate (50mg  elemental zinc)  220 mg Per Tube Daily   Continuous Infusions:  feeding supplement (OSMOLITE 1.5 CAL) 50 mL/hr at 08/17/23 0500   levofloxacin (LEVAQUIN) IV Stopped (08/17/23 1950)     LOS: 5 days      Tresa Moore, MD [facility]   If 7PM-7AM, please contact night-coverage  08/18/2023, 10:47 AM

## 2023-08-18 NOTE — Consult Note (Signed)
PHARMACY CONSULT NOTE  Pharmacy Consult for Electrolyte Monitoring and Replacement   Recent Labs: Potassium (mmol/L)  Date Value  08/18/2023 5.3 (H)   Magnesium (mg/dL)  Date Value  16/03/9603 2.3   Calcium (mg/dL)  Date Value  54/02/8118 7.8 (L)   Albumin (g/dL)  Date Value  14/78/2956 2.1 (L)   Phosphorus (mg/dL)  Date Value  21/30/8657 2.5   Sodium (mmol/L)  Date Value  08/18/2023 141   Assessment: 83 y.o. 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  Recent hospital course was complicated by urinary retention.  He has been nonverbal since his last hospitalization.  He presents on this admission with worsening mental status, fever, hypoxemia   MIVF: D5w at 40 ml/hr Diet: Currently NPO, NG tube on tube feeds  Goal of Therapy:  WNL  Plan: K 5.3, hemolyzed sample Follow-up electrolytes with AM labs tomorrow  Tressie Ellis 08/18/2023 8:24 AM

## 2023-08-18 NOTE — Plan of Care (Signed)
  Problem: Health Behavior/Discharge Planning: Goal: Ability to manage health-related needs will improve Outcome: Progressing   Problem: Clinical Measurements: Goal: Ability to maintain clinical measurements within normal limits will improve Outcome: Progressing   Problem: Clinical Measurements: Goal: Will remain free from infection Outcome: Progressing   Problem: Clinical Measurements: Goal: Respiratory complications will improve Outcome: Progressing   Problem: Clinical Measurements: Goal: Cardiovascular complication will be avoided Outcome: Progressing   

## 2023-08-18 NOTE — Progress Notes (Signed)
Palliative Care Progress Note, Assessment & Plan   Patient Name: Justin Bird       Date: 08/18/2023 DOB: February 26, 1941  Age: 83 y.o. MRN#: [id number] Attending Physician: Justin Moore, MD Primary Care Physician: Pcp, No Admit Date: 08/12/2023  Subjective: Patient is sitting up in bed in no apparent distress.  NG tube is at left nare and patient has mittens on.  His friend/caregiver Justin Bird is at bedside.  RN and nursing student are at bedside flushing NG tube.  HPI: 83 y.o. male  with past medical history of depression, Parkinson's tremors, and hard of hearing (cochlear implant) admitted on 08/12/2023 with AMS, fever, and hypoxemia.   Patient is being treated for sepsis secondary to UTI (5-day course of Levaquin), acute metabolic encephalopathy with concern for possible CVA (Seroquel stopped, Sinemet restarted via NG) for which neurology has been consulted, conjunctivitis (Polytrim OU 4 times daily), hypernatremia secondary to dehydration (IVF).   PMT was consulted to support patient with goals of care discussions.   Of note, patient and family are familiar to me as I worked with him during patient's previous extended hospitalization.  Summary of counseling/coordination of care: Extensive chart review completed prior to meeting patient including labs, vital signs, imaging, progress notes, orders, and available advanced directive documents from current and previous encounters.   After reviewing the patient's chart and assessing the patient at bedside, I spoke with patient in regards to symptom management and goals of care.   Symptoms assessed.  Patient is unable to participate in assessment.  Signs of distress such as brow furrowing, grimacing, moaning, and fidgeting not noted.  Caregiver at bedside  shares that patient is agitated with NG tube in his nose but that he is easily redirected.  Patient meets my gaze and tracks me with his eyes.  He appropriately looks at whoever is talking in the room.  He seems much more awake and alert as compared to yesterday.  No adjustment to Justin Bird needed at this time.  After meeting with the patient, I spoke with attending Dr. Gustavus Bird.  Plan remains to continue current course with NG to administer Sinemet to see how patient's mentation improves with hopes that p.o. intake can be safely attempted in the near future.  After counseling with medical team and seeing patient, spoke with patient's son/HC POA Justin Bird over the phone.  Updated Justin Bird on the above.  Discussed that while patient is showing improvements his new baseline may not be his old baseline.  Time will tell.  Reviewed that plan is to continue with Sinemet and to reevaluate early next week to see if his mentation has improved at all.  Justin Bird was appreciative of my phone call and is in agreement with this plan.  Space and opportunity provided for Justin Bird to ask questions.  Questions and concerns addressed.  PMT will continue to follow and support patient and family throughout his hospitalization.  Physical Exam Constitutional:      General: He is not in acute distress.    Appearance: He is normal weight.  HENT:     Head: Normocephalic.     Nose: Nose normal.     Comments: NG to left nare  Mouth/Throat:     Mouth: Mucous membranes are moist.  Eyes:     Pupils: Pupils are equal, round, and reactive to light.  Pulmonary:     Effort: Pulmonary effort is normal.  Abdominal:     Palpations: Abdomen is soft.  Skin:    General: Skin is warm and dry.  Neurological:     Mental Status: He is alert.  Psychiatric:        Mood and Affect: Mood normal.        Behavior: Behavior normal.             Total Time 35 minutes   Time spent includes: Detailed review of medical records (labs, imaging, vital  signs), medically appropriate exam (mental status, respiratory, cardiac, skin), discussed with treatment team, counseling and educating patient, family and staff, documenting clinical information, medication management and coordination of care.  Justin Bird L. Justin Quin, DNP, FNP-BC Palliative Medicine Team

## 2023-08-19 DIAGNOSIS — Z515 Encounter for palliative care: Secondary | ICD-10-CM | POA: Diagnosis not present

## 2023-08-19 DIAGNOSIS — H903 Sensorineural hearing loss, bilateral: Secondary | ICD-10-CM | POA: Diagnosis not present

## 2023-08-19 DIAGNOSIS — E440 Moderate protein-calorie malnutrition: Secondary | ICD-10-CM | POA: Diagnosis not present

## 2023-08-19 DIAGNOSIS — A419 Sepsis, unspecified organism: Secondary | ICD-10-CM | POA: Diagnosis not present

## 2023-08-19 DIAGNOSIS — N390 Urinary tract infection, site not specified: Secondary | ICD-10-CM | POA: Diagnosis not present

## 2023-08-19 LAB — BASIC METABOLIC PANEL
Anion gap: 8 (ref 5–15)
BUN: 14 mg/dL (ref 8–23)
CO2: 24 mmol/L (ref 22–32)
Calcium: 7.7 mg/dL — ABNORMAL LOW (ref 8.9–10.3)
Chloride: 108 mmol/L (ref 98–111)
Creatinine, Ser: 0.39 mg/dL — ABNORMAL LOW (ref 0.61–1.24)
GFR, Estimated: >60 mL/min (ref >60)
Glucose, Bld: 138 mg/dL — ABNORMAL HIGH (ref 70–99)
Potassium: 3.6 mmol/L (ref 3.5–5.1)
Sodium: 140 mmol/L (ref 135–145)

## 2023-08-19 LAB — GLUCOSE, CAPILLARY
Glucose-Capillary: 117 mg/dL — ABNORMAL HIGH (ref 70–99)
Glucose-Capillary: 122 mg/dL — ABNORMAL HIGH (ref 70–99)
Glucose-Capillary: 128 mg/dL — ABNORMAL HIGH (ref 70–99)
Glucose-Capillary: 134 mg/dL — ABNORMAL HIGH (ref 70–99)
Glucose-Capillary: 136 mg/dL — ABNORMAL HIGH (ref 70–99)
Glucose-Capillary: 137 mg/dL — ABNORMAL HIGH (ref 70–99)
Glucose-Capillary: 142 mg/dL — ABNORMAL HIGH (ref 70–99)
Glucose-Capillary: 146 mg/dL — ABNORMAL HIGH (ref 70–99)

## 2023-08-19 NOTE — Progress Notes (Signed)
 PROGRESS NOTE    Justin Bird  WUJ:[id number] DOB: [birth year] DOA: 08/12/2023 PCP: Pcp, No    Brief Narrative:   Justin Bird is 83 y.o. 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  Recent hospital course was complicated by urinary retention.  He has been nonverbal since his last hospitalization.  He presents on this admission with worsening mental status, fever, hypoxemia On arrival to the ED Tmax 101, BP 87/45 which improved with fluid resuscitation.  Labs reveal mild lactic acidosis, hypernatremia 158, chloride 120, glucose 138, WBC 15.4, hemoglobin 17.6.  CT head reveals chronic occipital infarct as well as right frontal, ethmoid and maxillary sinus disease.  He received 2 L LR, 500 cc NS, aztreonam, vancomycin, Flagyl.  TRH admitted the patient for sepsis.    Assessment & Plan:   Principal Problem:   Sepsis secondary to UTI [facility]) Active Problems:   Acute metabolic encephalopathy   SNHL (sensory-neural hearing loss), asymmetrical   Conjunctivitis   Parkinson's disease (HCC)   Malnutrition of moderate degree  Sepsis - Criteria met on arrival with: Fever 101, hypotensive, altered mental status, leukocytosis -- Presumed urinary source, CXR showed no acute cardiopulmonary disease - Status post fluid resuscitation.  Blood pressure has improved -- Urine culture yields 40,000 colonies of gram-positive cocci, Aerococcus.  Unclear whether this represents true infection Plan: Completed course of levaquin No further abx at this time Monitor vitals and fever curve      Acute metabolic encephalopathy Concern for possible CVA - Likely has baseline dementia exacerbated by hearing loss, hypernatremia and sepsis - Patient remains lethargic and has been evaluated by speech therapy --Suspect this is multifactorial  secondary to rapid discontinuation of Sinemet, infection as above, high dose atypical antipsychotics. --Low suspicion this patient truly did have a recurrent stroke however we are unable to do MRI due to presence of cochlear implant Plan: Continue NPO.  Continue NG tube in place with tube feedings.  Sinemet 3 times daily via tube.  Seroquel remains on hold.  Monitor mental status closely    Conjunctivitis - Also has purulent discharge bilaterally - Continue with Polytrim OU 4 times daily -- Improved over interval     Sensorineural hearing loss - Chronic, has had prior cochlear implant but this has since been lost. - Needs assistance with communication --Caregiver brought in hearing aids   Parkinson's disease - Diagnosis is somewhat in question.  Appears that Sinemet was initiated for tremors.  In any case the patient now needs Sinemet.  He has NG tube in place and Sinemet can be crushed     Hypernatremia Appears secondary to dehydration/intravascular volume depletion.  Sodium improved to 141 on 2/21.  Discontinued D5 water 2/21.  Continue free water flushes via NG tube.  Unclear baseline - Patient recently had prolonged hospital stay for sepsis, and his son reports during that time he was heavily sedated.  He comes to [place] now from [facility] where he is on 100 mg Seroquel 3 times daily and trazodone 150 mg.   Trazodone has been discontinued and  Seroquel has been changed to as needed since patient is sedated     Poor prognosis - Patient's age, comorbidities, and recent hospitalization for toxic metabolic encephalopathy with repeat admission not long after discharge is concerning.  Overall prognosis guarded..  Patient is DNR status.  Appreciate palliative care involvement in care  DVT prophylaxis: SQ Lovenox Code Status: DNR Family Communication: Daughter [name] [phone] 2/19, voicemail left on 2/20 Disposition Plan: Status is: Inpatient Remains inpatient appropriate  because: Metabolic encephalopathy of unclear etiology   Level of care: Med-Surg  Consultants:  Palliative care  Procedures:  NG tube  Antimicrobials:   Subjective: Seen and examined.  No family bedside this morning.  Patient appears fatigued.  Correcting weakly in response to questions.  Objective: Vitals:   08/18/23 1700 08/18/23 2254 08/19/23 0410 08/19/23 0809  BP: 129/80 96/63  112/67  Pulse: 95 89  87  Resp: 18 18  16   Temp: 99.2 F (37.3 C) 98.5 F (36.9 C)  97.6 F (36.4 C)  TempSrc: Axillary   Oral  SpO2: 98% 98%  96%  Weight:   72.8 kg   Height:        Intake/Output Summary (Last 24 hours) at 08/19/2023 1025 Last data filed at 08/18/2023 1300 Gross per 24 hour  Intake --  Output 151 ml  Net -151 ml   Filed Weights   08/16/23 0500 08/18/23 0600 08/19/23 0410  Weight: 70.4 kg 70.9 kg 72.8 kg    Examination:  General exam: Lethargic, fatigued, chronically ill-appearing Respiratory system: Poor respiratory effort.  Scattered crackles.  Normal work of breathing.  Room air Cardiovascular system: S1-S2, RRR, no murmurs, no pedal edema Gastrointestinal system: Soft, NT/ND, normal bowel sounds Central nervous system: Lethargic.  Awake.  Aphasic.  Unable to assess orientation Extremities: Decreased power symmetrically.  Unable to ambulate Skin: No rashes, lesions or ulcers Psychiatry: Unable to assess    Data Reviewed: I have personally reviewed following labs and imaging studies  CBC: Recent Labs  Lab 08/14/23 0434 08/15/23 0508 08/16/23 0521 08/17/23 0510 08/18/23 0828  WBC 19.3* 14.2* 9.8 7.8 8.7  NEUTROABS 15.8* 11.8* 7.6 5.7 5.8  HGB 14.7 13.4 13.3 13.0 13.0  HCT 49.3 43.5 42.3 43.6 40.2  MCV 99.2 95.8 94.2 98.4 91.2  PLT 219 212 227 156 228   Basic Metabolic Panel: Recent Labs  Lab 08/15/23 0508 08/15/23 1932 08/16/23 0521 08/16/23 1714 08/17/23 0510 08/18/23 0532 08/19/23 0403  NA 154*  --  151*  --  146* 141 140  K 3.0*  --   3.2* 3.3* 3.7 5.3* 3.6  CL 119*  --  120*  --  118* 110 108  CO2 27  --  24  --  23 22 24   GLUCOSE 109*  --  92  --  142* 148* 138*  BUN 24*  --  20  --  18 14 14   CREATININE 0.43*  --  0.39*  --  0.39* 0.44* 0.39*  CALCIUM 8.1*  --  8.0*  --  7.7* 7.8* 7.7*  MG 2.2 2.3 2.3  --  2.3 2.3  --   PHOS 1.7* 1.9* 2.5  --  2.3* 2.5  --    GFR: Estimated Creatinine Clearance: 63 mL/min (A) (by C-G formula based on SCr of 0.39 mg/dL (L)). Liver Function Tests: Recent Labs  Lab 08/14/23 0434 08/15/23 0508 08/16/23 0521 08/17/23 0510 08/18/23 0532  AST 29 23 30 31  42*  ALT 47* 39 34 17 19  ALKPHOS 54 50 49 47 49  BILITOT 0.9 0.8 0.4 0.5 1.0  PROT 5.3* 5.0* 5.1* 4.5* 4.7*  ALBUMIN 2.5* 2.2* 2.1* 2.0* 2.1*   No results for input(s): "LIPASE", "AMYLASE" in the last 168 hours. No results for input(s): "AMMONIA" in the last 168 hours. Coagulation Profile: No results for  input(s): "INR", "PROTIME" in the last 168 hours. Cardiac Enzymes: No results for input(s): "CKTOTAL", "CKMB", "CKMBINDEX", "TROPONINI" in the last 168 hours. BNP (last 3 results) No results for input(s): "PROBNP" in the last 8760 hours. HbA1C: No results for input(s): "HGBA1C" in the last 72 hours. CBG: Recent Labs  Lab 08/18/23 1712 08/18/23 2004 08/19/23 0032 08/19/23 0409 08/19/23 0822  GLUCAP 109* 135* 122* 136* 146*   Lipid Profile: No results for input(s): "CHOL", "HDL", "LDLCALC", "TRIG", "CHOLHDL", "LDLDIRECT" in the last 72 hours. Thyroid Function Tests: No results for input(s): "TSH", "T4TOTAL", "FREET4", "T3FREE", "THYROIDAB" in the last 72 hours.  Anemia Panel: No results for input(s): "VITAMINB12", "FOLATE", "FERRITIN", "TIBC", "IRON", "RETICCTPCT" in the last 72 hours. Sepsis Labs: Recent Labs  Lab 08/12/23 1428 08/12/23 1843 08/12/23 2202 08/16/23 0521  PROCALCITON  --   --   --  0.11  LATICACIDVEN 2.3* 3.3* 2.5*  --     Recent Results (from the past 240 hours)  Blood Culture (routine  x 2)     Status: None   Collection Time: 08/12/23  2:23 PM   Specimen: BLOOD  Result Value Ref Range Status   Specimen Description BLOOD BLOOD LEFT ARM  Final   Special Requests   Final    BOTTLES DRAWN AEROBIC AND ANAEROBIC Blood Culture adequate volume   Culture   Final    NO GROWTH 5 DAYS Performed at [facility], [address] [place], [place] [address]    Report Status 08/17/2023 FINAL  Final  Resp panel by RT-PCR (RSV, Flu A&B, Covid) Anterior Nasal Swab     Status: None   Collection Time: 08/12/23  2:28 PM   Specimen: Anterior Nasal Swab  Result Value Ref Range Status   SARS Coronavirus 2 by RT PCR NEGATIVE NEGATIVE Final    Comment: (NOTE) SARS-CoV-2 target nucleic acids are NOT DETECTED.  The SARS-CoV-2 RNA is generally detectable in upper respiratory specimens during the acute phase of infection. The lowest concentration of SARS-CoV-2 viral copies this assay can detect is 138 copies/mL. A negative result does not preclude SARS-Cov-2 infection and should not be used as the sole basis for treatment or other patient management decisions. A negative result may occur with  improper specimen collection/handling, submission of specimen other than nasopharyngeal swab, presence of viral mutation(s) within the areas targeted by this assay, and inadequate number of viral copies(<138 copies/mL). A negative result must be combined with clinical observations, patient history, and epidemiological information. The expected result is Negative.  Fact Sheet for Patients:  BloggerCourse.com  Fact Sheet for Healthcare Providers:  SeriousBroker.it  This test is no t yet approved or cleared by the [place] FDA and  has been authorized for detection and/or diagnosis of SARS-CoV-2 by FDA under an Emergency Use Authorization (EUA). This EUA will remain  in effect (meaning this test can be used) for the duration of  the COVID-19 declaration under Section 564(b)(1) of the Act, 21 U.S.C.section 360bbb-3(b)(1), unless the authorization is terminated  or revoked sooner.       Influenza A by PCR NEGATIVE NEGATIVE Final   Influenza B by PCR NEGATIVE NEGATIVE Final    Comment: (NOTE) The Xpert Xpress SARS-CoV-2/FLU/RSV plus assay is intended as an aid in the diagnosis of influenza from Nasopharyngeal swab specimens and should not be used as a sole basis for treatment. Nasal washings and aspirates are unacceptable for Xpert Xpress SARS-CoV-2/FLU/RSV testing.  Fact Sheet for Patients: BloggerCourse.com  Fact Sheet for Healthcare  Providers: SeriousBroker.it  This test is not yet approved or cleared by the [place] and has been authorized for detection and/or diagnosis of SARS-CoV-2 by FDA under an Emergency Use Authorization (EUA). This EUA will remain in effect (meaning this test can be used) for the duration of the COVID-19 declaration under Section 564(b)(1) of the Act, 21 U.S.C. section 360bbb-3(b)(1), unless the authorization is terminated or revoked.     Resp Syncytial Virus by PCR NEGATIVE NEGATIVE Final    Comment: (NOTE) Fact Sheet for Patients: BloggerCourse.com  Fact Sheet for Healthcare Providers: SeriousBroker.it  This test is not yet approved or cleared by the [place] FDA and has been authorized for detection and/or diagnosis of SARS-CoV-2 by FDA under an Emergency Use Authorization (EUA). This EUA will remain in effect (meaning this test can be used) for the duration of the COVID-19 declaration under Section 564(b)(1) of the Act, 21 U.S.C. section 360bbb-3(b)(1), unless the authorization is terminated or revoked.  Performed at [facility], [address] [place], [place] [address]   Urine Culture     Status: Abnormal   Collection Time: 08/12/23  8:55  PM   Specimen: Urine, Random  Result Value Ref Range Status   Specimen Description   Final    URINE, RANDOM Performed at [facility], [address] [place], [place] [address]    Special Requests   Final    NONE Reflexed from [id number] Performed at [facility], [address] [place], [place] [address]    Culture (A)  Final    40,000 COLONIES/mL AEROCOCCUS URINAE Standardized susceptibility testing for this organism is not available. Performed at [facility], [address], [place], [place] [address]    Report Status 08/16/2023 FINAL  Final  Culture, blood (Routine X 2) w Reflex to ID Panel     Status: None   Collection Time: 08/13/23  9:04 PM   Specimen: BLOOD  Result Value Ref Range Status   Specimen Description BLOOD BLOOD RIGHT HAND BLOOD RIGHT ARM  Final   Special Requests   Final    Blood Culture results may not be optimal due to an inadequate volume of blood received in culture bottles   Culture   Final    NO GROWTH 5 DAYS Performed at [facility], [address] [place], [place] [address]    Report Status 08/18/2023 FINAL  Final  MRSA Next Gen by PCR, Nasal     Status: None   Collection Time: 08/14/23  4:00 AM   Specimen: Nasal Mucosa; Nasal Swab  Result Value Ref Range Status   MRSA by PCR Next Gen NOT DETECTED NOT DETECTED Final    Comment: (NOTE) The GeneXpert MRSA Assay (FDA approved for NASAL specimens only), is one component of a comprehensive MRSA colonization surveillance program. It is not intended to diagnose MRSA infection nor to guide or monitor treatment for MRSA infections. Test performance is not FDA approved in patients less than 69 years old. Performed at [facility], [address] [place], [place] [address]          Radiology Studies: DG Abd 1 View Result Date: 08/18/2023 CLINICAL DATA:  Enteric catheter placement EXAM: ABDOMEN - 1 VIEW COMPARISON:  08/17/2023 FINDINGS: Frontal view of  the lower chest and upper abdomen demonstrates enteric catheter passing below diaphragm, extending along the expected course of the duodenum. Weighted tip projects to the left of midline at the region of the duodenal-jejunal  junction. No change since earlier exam. Bowel gas pattern is unremarkable. Patchy consolidation at the left lung base. IMPRESSION: 1. Stable enteric catheter, weighted tip projecting over the region of the duodenal-jejunal junction. Electronically Signed   By: Sharlet Salina M.D.   On: 08/18/2023 21:07   DG Abd 1 View Result Date: 08/17/2023 CLINICAL DATA:  [id number] Nasogastric tube present [id number] EXAM: ABDOMEN - 1 VIEW COMPARISON:  x-ray abdomen 08/15/2023 FINDINGS: Patient is rotated. Enteric tube coursing below the hemidiaphragm. Tip likely within the fourth portion of the duodenum. The bowel gas pattern is normal. No radio-opaque calculi or other significant radiographic abnormality are seen. IMPRESSION: Enteric tube with tip likely within the fourth portion of the duodenum. Slightly limited evaluation due to patient rotation. Electronically Signed   By: Tish Frederickson M.D.   On: 08/17/2023 18:20        Scheduled Meds:  vitamin C  500 mg Per Tube BID   aspirin  81 mg Per Tube Daily   bisacodyl  10 mg Rectal QHS   carbidopa-levodopa  2 tablet Per NG tube TID   cyanocobalamin  1,000 mcg Per Tube Daily   doxazosin  1 mg Per Tube Daily   enoxaparin (LOVENOX) injection  40 mg Subcutaneous Q24H   escitalopram  5 mg Per Tube Daily   feeding supplement (PROSource TF20)  60 mL Per Tube Daily   free water  30 mL Per Tube Q4H   latanoprost  1 drop Both Eyes QHS   multivitamin with minerals  1 tablet Per Tube Daily   polyethylene glycol  17 g Per Tube BID   thiamine  100 mg Per Tube Daily   trimethoprim-polymyxin b  2 drop Both Eyes Q6H   zinc sulfate (50mg  elemental zinc)  220 mg Per Tube Daily   Continuous Infusions:  feeding supplement (OSMOLITE 1.5 CAL) 50 mL/hr at  08/17/23 0500     LOS: 6 days      Tresa Moore, MD [facility]   If 7PM-7AM, please contact night-coverage  08/19/2023, 10:25 AM

## 2023-08-19 NOTE — Progress Notes (Addendum)
 Palliative Care Progress Note, Assessment & Plan   Patient Name: Justin Bird       Date: 08/19/2023 DOB: 1940/08/12  Age: 83 y.o. MRN#: [id number] Attending Physician: Tresa Moore, MD Primary Care Physician: Pcp, No Admit Date: 08/12/2023  Subjective: Patient is sleeping comfortably with mittens in place.  NG remains in left nare.  No family or friends present during my visit.  He awakens to my presence by opening his right eye.  His gaze does not meet mine.  HPI: 83 y.o. male  with past medical history of depression, Parkinson's tremors, and hard of hearing (cochlear implant) admitted on 08/12/2023 with AMS, fever, and hypoxemia.   Patient is being treated for sepsis secondary to UTI (5-day course of Levaquin), acute metabolic encephalopathy with concern for possible CVA (Seroquel stopped, Sinemet restarted via NG) for which neurology has been consulted, conjunctivitis (Polytrim OU 4 times daily), hypernatremia secondary to dehydration (IVF).   PMT was consulted to support patient with goals of care discussions.   Of note, patient and family are familiar to me as I worked with him during patient's previous extended hospitalization.  Summary of counseling/coordination of care: Extensive chart review completed prior to meeting patient including labs, vital signs, imaging, progress notes, orders, and available advanced directive documents from current and previous encounters.   After reviewing the patient's chart and assessing the patient at bedside, I attempted to speak with patient regards to his plan of care.  He is awake but not able to participate in goals of care medical decision making independently at this time.  He is resting comfortably.  Nonverbal signs of discomfort such as brow  furrowing, grimacing, moaning, and fidgeting not noted.  No adjustment to [facility] needed at this time.  No changes to plan of care at this time.  Plan remains for patient to continue to receive Sinemet via NG tube over the weekend and reassess patient's mentation/functional abilities on Monday.  Given that patient will likely transfer back to his long-term care facility, I think it is important to outlines patient's/family's boundaries and goals of care and a MOST form.  Copy of MOST form left at bedside for patient and family to review.  PMT remains available to patient and family throughout his hospitalization.  I will follow-up with patient and family upon my return on Tuesday.  Should acute needs arise before then, please utilize [employer] for available PMT providers.  Physical Exam Vitals reviewed.  Constitutional:      General: He is not in acute distress.    Appearance: He is normal weight.  HENT:     Head: Normocephalic.     Nose: Nose normal.     Mouth/Throat:     Mouth: Mucous membranes are moist.  Eyes:     Pupils: Pupils are equal, round, and reactive to light.  Pulmonary:     Effort: Pulmonary effort is normal.  Abdominal:     Palpations: Abdomen is soft.  Skin:    General: Skin is warm and dry.  Neurological:     Mental Status: He is alert.     Comments: Non verbal  Psychiatric:        Mood and Affect:  Mood normal.        Behavior: Behavior normal.             Total Time 25 minutes   Time spent includes: Detailed review of medical records (labs, imaging, vital signs), medically appropriate exam (mental status, respiratory, cardiac, skin), discussed with treatment team, counseling and educating patient, family and staff, documenting clinical information, medication management and coordination of care.  Justin Deist L. Bonita Quin, DNP, FNP-BC Palliative Medicine Team

## 2023-08-19 NOTE — Progress Notes (Addendum)
 Speech Language Pathology Treatment: Dysphagia  Patient Details Name: Justin Bird MRN: [id number] DOB: August 05, 1940 Today's Date: 08/19/2023 Time: 1000-1030 SLP Time Calculation (min) (ACUTE ONLY): 30 min  Assessment / Plan / Recommendation Clinical Impression  Pt seen for dysphagia intervention targeting pt's PO readiness for diet initiation. Pt with eyes opened upon therapist entrance. Personal caregiver, [name], present and engaged for duration of session. Pt with continued confusion- mumbled speech. Oral completed with pt continuing to bite on swab, redirected with verbal and tactile cues. Pt seen with trials of ice, thin liquids (via straw), nectar thick liquids (via cup/straw), and puree solids. Total assist provided for feeding- therapist attempted hand over hand assist for holding cup, with pt gripping therapist hand without attempt to hold cup. Verbal cues and removal of straw utilized to monitor straw sips. Oral phase notable for increased oral acceptance and manipulation across trials. Moderate verbal and tactile cure provided for visual inspection to ensure oral clearance. Pt completing total trials x20 with consistent acceptance and clearance and sustained engagement with verbal/tactile redirection. No overt or subtle s/sx pharyngeal dysphagia noted. No change to vocal quality across trials. No clinical difference in nectar thick vs thin liquids.   Pt remains at significant risk for aspiration based on medical hx (dementia and Parkinson's disease), current respiratory function, debility, dependency with feeding, and altered mental status. Recommend tentative initiation of thin liquid and puree solids diet- with strict aspiration precautions (slow rate, small bites, elevated HOB), monitor alertness for PO intake. Full supervision for meals and total assist for intake. MD and RN aware of recommendations. Sign posted with recommendations and caregiver updated on precautions and diet.SLP will  continue to follow for dysphagia intervention.      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 He was recently hospitalized for toxic metabolic encephalopathy  1/2-08/03/23. His course was complicated by urinary retention. Also noted were increased Meds for Agitation/behavior during the lengthy admission. Per chart notes, "patient had been doing well until family abruptly discontinued his Sinemet for unclear reasons, and he subsequently developed confusion/altered mental status for which he presented to ED. In the ED, MDs assessed pt d/t his increased Agitation indicating concern of "diagnosis to include early stage of Dementia, delirium, vitamin B12 deficiency and abnormal thyroid function. Patient has possibly missed some dose of Sinemet, which may have contributed partially due to withdrawal. Patient received IV Ativan, IV Haldol, IM Zyprexa in ED.". Pt had a Caregiver caring for him in the home prior to recent admissions.     He presents to [facility]-ED this admit for worsening mental status, fever and hypoxemia and dx'd w/ Sepsis.   CXR this admit: No acute cardiopulmonary abnormality.  2. Leftward deviation of the superior trachea. 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      SLP Plan  Continue with current plan of care      Recommendations for follow up therapy are one component of a multi-disciplinary discharge planning process, led by the attending physician.  Recommendations may be updated based on patient status, additional functional criteria and insurance authorization.    Recommendations  Diet recommendations: Dysphagia 1 (puree);Thin liquid Liquids provided via: Teaspoon;Straw (monitor straw use) Medication Administration: Via alternative means Supervision: Full supervision/cueing for compensatory strategies;Staff to assist with self  feeding Compensations: Minimize environmental distractions;Slow rate;Small sips/bites;Follow solids with liquid Postural Changes and/or Swallow  Maneuvers: Seated upright 90 degrees                  Oral care BID;Oral care before and after PO   Frequent or constant Supervision/Assistance Dysphagia, oropharyngeal phase (R13.12)     Continue with current plan of care    [place] Tylor Courtwright Clapp, MS, CCC-SLP Speech Language Pathologist Rehab Services; [facility] [phone] (ascom)   [place] J Clapp  08/19/2023, 10:55 AM

## 2023-08-19 NOTE — Plan of Care (Signed)
  Problem: Activity: Goal: Risk for activity intolerance will decrease Outcome: Progressing   Problem: Safety: Goal: Ability to remain free from injury will improve Outcome: Progressing   

## 2023-08-19 NOTE — Consult Note (Signed)
 PHARMACY CONSULT NOTE  Pharmacy Consult for Electrolyte Monitoring and Replacement   Recent Labs: Potassium (mmol/L)  Date Value  08/19/2023 3.6   Magnesium (mg/dL)  Date Value  16/03/9603 2.3   Calcium (mg/dL)  Date Value  54/02/8118 7.7 (L)   Albumin (g/dL)  Date Value  14/78/2956 2.1 (L)   Phosphorus (mg/dL)  Date Value  21/30/8657 2.5   Sodium (mmol/L)  Date Value  08/19/2023 140   Assessment: 83 y.o. 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  Recent hospital course was complicated by urinary retention.  He has been nonverbal since his last hospitalization.  He presents on this admission with worsening mental status, fever, hypoxemia   Diet: Currently NPO, NG tube on tube feeds  Goal of Therapy:  WNL  Plan: No electrolyte replacement indicated at this time. Electrolytes have been stable. Will discontinue consult. Defer further ordering of labs and electrolyte replacement to primary team  Tressie Ellis 08/19/2023 7:24 AM

## 2023-08-19 NOTE — Plan of Care (Signed)
  Problem: Education: Goal: Knowledge of General Education information will improve Description: Including pain rating scale, medication(s)/side effects and non-pharmacologic comfort measures Outcome: Progressing   Problem: Clinical Measurements: Goal: Ability to maintain clinical measurements within normal limits will improve Outcome: Progressing Goal: Will remain free from infection Outcome: Progressing   Problem: Activity: Goal: Risk for activity intolerance will decrease Outcome: Progressing   Problem: Nutrition: Goal: Adequate nutrition will be maintained Outcome: Progressing   Problem: Safety: Goal: Ability to remain free from injury will improve 08/19/2023 1955 by Frann Rider D, LPN Outcome: Progressing 08/19/2023 1954 by Jomarie Longs, LPN Outcome: Progressing

## 2023-08-19 NOTE — Plan of Care (Signed)
  Problem: Education: Goal: Knowledge of General Education information will improve Description: Including pain rating scale, medication(s)/side effects and non-pharmacologic comfort measures Outcome: Progressing   Problem: Health Behavior/Discharge Planning: Goal: Ability to manage health-related needs will improve Outcome: Progressing   Problem: Activity: Goal: Risk for activity intolerance will decrease Outcome: Progressing   Problem: Nutrition: Goal: Adequate nutrition will be maintained Outcome: Progressing   Problem: Elimination: Goal: Will not experience complications related to bowel motility Outcome: Progressing Goal: Will not experience complications related to urinary retention Outcome: Progressing   Problem: Safety: Goal: Ability to remain free from injury will improve Outcome: Progressing

## 2023-08-20 DIAGNOSIS — N390 Urinary tract infection, site not specified: Secondary | ICD-10-CM | POA: Diagnosis not present

## 2023-08-20 DIAGNOSIS — A419 Sepsis, unspecified organism: Secondary | ICD-10-CM | POA: Diagnosis not present

## 2023-08-20 LAB — GLUCOSE, CAPILLARY
Glucose-Capillary: 123 mg/dL — ABNORMAL HIGH (ref 70–99)
Glucose-Capillary: 120 mg/dL — ABNORMAL HIGH (ref 70–99)
Glucose-Capillary: 138 mg/dL — ABNORMAL HIGH (ref 70–99)
Glucose-Capillary: 106 mg/dL — ABNORMAL HIGH (ref 70–99)
Glucose-Capillary: 126 mg/dL — ABNORMAL HIGH (ref 70–99)

## 2023-08-20 LAB — CREATININE, SERUM
Creatinine, Ser: 0.33 mg/dL — ABNORMAL LOW (ref 0.61–1.24)
GFR, Estimated: >60 mL/min (ref >60)

## 2023-08-20 MED ORDER — ADULT MULTIVITAMIN W/MINERALS CH
1.0000 | ORAL_TABLET | Freq: Every day | ORAL | Status: DC
  Administered 2023-08-20 – 2023-08-25 (×6): 1 via ORAL
  Filled 2023-08-20 (×6): qty 1

## 2023-08-20 MED ORDER — VITAMIN C 500 MG PO TABS
500.0000 mg | ORAL_TABLET | Freq: Two times a day (BID) | ORAL | Status: DC
  Administered 2023-08-20 – 2023-08-25 (×11): 500 mg via ORAL
  Filled 2023-08-20 (×11): qty 1

## 2023-08-20 MED ORDER — ACETAMINOPHEN 650 MG RE SUPP: 650.0000 mg | Freq: Four times a day (QID) | RECTAL | Status: DC | PRN

## 2023-08-20 MED ORDER — ACETAMINOPHEN 325 MG PO TABS
650.0000 mg | ORAL_TABLET | Freq: Four times a day (QID) | ORAL | Status: DC | PRN
  Administered 2023-08-20 – 2023-08-25 (×4): 650 mg via ORAL
  Filled 2023-08-20 (×5): qty 2

## 2023-08-20 MED ORDER — THIAMINE MONONITRATE 100 MG PO TABS
100.0000 mg | ORAL_TABLET | Freq: Every day | ORAL | Status: AC
Start: 2023-08-20 — End: 2023-08-22
  Administered 2023-08-20 – 2023-08-21 (×2): 100 mg via ORAL
  Filled 2023-08-20 (×2): qty 1

## 2023-08-20 MED ORDER — ASPIRIN 81 MG PO CHEW
81.0000 mg | CHEWABLE_TABLET | Freq: Every day | ORAL | Status: DC
  Administered 2023-08-20 – 2023-08-25 (×6): 81 mg via ORAL
  Filled 2023-08-20 (×6): qty 1

## 2023-08-20 MED ORDER — ZINC SULFATE 220 (50 ZN) MG PO CAPS
220.0000 mg | ORAL_CAPSULE | Freq: Every day | ORAL | Status: DC
  Administered 2023-08-20: 220 mg via ORAL
  Filled 2023-08-20: qty 1

## 2023-08-20 MED ORDER — GERHARDT'S BUTT CREAM
TOPICAL_CREAM | Freq: Two times a day (BID) | CUTANEOUS | Status: DC
  Filled 2023-08-20: qty 60

## 2023-08-20 MED ORDER — VITAMIN B-12 1000 MCG PO TABS
1000.0000 ug | ORAL_TABLET | Freq: Every day | ORAL | Status: DC
  Administered 2023-08-20 – 2023-08-25 (×6): 1000 ug via ORAL
  Filled 2023-08-20 (×7): qty 1

## 2023-08-20 MED ORDER — POLYETHYLENE GLYCOL 3350 17 G PO PACK
17.0000 g | PACK | Freq: Two times a day (BID) | ORAL | Status: DC
  Administered 2023-08-20 – 2023-08-25 (×9): 17 g via ORAL
  Filled 2023-08-20 (×9): qty 1

## 2023-08-20 MED ORDER — DOXAZOSIN MESYLATE 1 MG PO TABS
1.0000 mg | ORAL_TABLET | Freq: Every day | ORAL | Status: DC
  Administered 2023-08-20 – 2023-08-25 (×6): 1 mg via ORAL
  Filled 2023-08-20 (×6): qty 1

## 2023-08-20 MED ORDER — ESCITALOPRAM OXALATE 10 MG PO TABS
5.0000 mg | ORAL_TABLET | Freq: Every day | ORAL | Status: DC
  Administered 2023-08-20 – 2023-08-25 (×6): 5 mg via ORAL
  Filled 2023-08-20 (×6): qty 1

## 2023-08-20 MED ORDER — CARBIDOPA-LEVODOPA 25-100 MG PO TABS
2.0000 | ORAL_TABLET | Freq: Three times a day (TID) | ORAL | Status: DC
  Administered 2023-08-20 – 2023-08-25 (×17): 2 via ORAL
  Filled 2023-08-20 (×17): qty 2

## 2023-08-20 NOTE — Consult Note (Addendum)
 WOC Nurse Consult Note: Reason for Consult:Stage 2 pressure injury and MASD Patient with dementia, hx parkinson's, admitted for UTI Wound type: Stage 2 Pressure injury Irritant Contact Dermatitis  Pressure Injury POA: Yes Measurement:see nursing flow sheets Wound bed:see nursing flow sheets Drainage (amount, consistency, odor) see nursing flow sheets Periwound: intact Dressing procedure/placement/frequency: Implemented nursing skin care order set for Stage 2 Pressure injury; MASD (Now ICD).    Re consult if needed, will not follow at this time. Thanks  Sheikh Leverich [employer], RN,CWOCN, CNS, CWON-AP [phone])

## 2023-08-20 NOTE — Progress Notes (Addendum)
 PROGRESS NOTE    Justin Bird  JXB:[id number] DOB: 09/22/1940 DOA: 08/12/2023 PCP: Pcp, No    Brief Narrative:   Justin Bird is 83 y.o. 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  Recent hospital course was complicated by urinary retention.  He has been nonverbal since his last hospitalization.  He presents on this admission with worsening mental status, fever, hypoxemia On arrival to the ED Tmax 101, BP 87/45 which improved with fluid resuscitation.  Labs reveal mild lactic acidosis, hypernatremia 158, chloride 120, glucose 138, WBC 15.4, hemoglobin 17.6.  CT head reveals chronic occipital infarct as well as right frontal, ethmoid and maxillary sinus disease.  He received 2 L LR, 500 cc NS, aztreonam, vancomycin, Flagyl.  TRH admitted the patient for sepsis.    Assessment & Plan:   Principal Problem:   Sepsis secondary to UTI [facility]) Active Problems:   Acute metabolic encephalopathy   SNHL (sensory-neural hearing loss), asymmetrical   Conjunctivitis   Parkinson's disease (HCC)   Malnutrition of moderate degree  Sepsis - Criteria met on arrival with: Fever 101, hypotensive, altered mental status, leukocytosis -- Presumed urinary source, CXR showed no acute cardiopulmonary disease - Status post fluid resuscitation.  Blood pressure has improved -- Urine culture yields 40,000 colonies of gram-positive cocci, Aerococcus.  Unclear whether this represents true infection Plan: Completed course of levaquin No further abx at this time Monitor vitals and fever curve No fever noted since discontinuation of abx      Acute metabolic encephalopathy Concern for possible CVA - Likely has baseline dementia exacerbated by hearing loss, hypernatremia and sepsis - Patient remains lethargic and has been evaluated by speech  therapy --Suspect this is multifactorial secondary to rapid discontinuation of Sinemet, infection as above, high dose atypical antipsychotics. --Low suspicion this patient truly did have a recurrent stroke however we are unable to do MRI due to presence of cochlear implant Plan: Diet was advanced to dysphagia 1 with thin liquids on 2/22.  Oral intake has been minimal however.  Will need to continue NGT placement with 3 times daily Sinemet.  Avoid any nonessential sedatives.   Conjunctivitis - Also has purulent discharge bilaterally - Continue with Polytrim OU 4 times daily -- Improved over interval     Sensorineural hearing loss - Chronic, has had prior cochlear implant but this has since been lost. - Needs assistance with communication --Caregiver brought in hearing aids   Parkinson's disease - Diagnosis is somewhat in question.  Appears that Sinemet was initiated for tremors.  In any case the patient now needs Sinemet.  He has NG tube in place and Sinemet can be crushed     Hypernatremia Appears secondary to dehydration/intravascular volume depletion.  Sodium improved to 141 on 2/21.  Discontinued D5 water 2/21.  Continue free water flushes via NG tube.  Check intermittent laboratory work of  Unclear baseline - Patient recently had prolonged hospital stay for sepsis, and his son reports during that time he was heavily sedated.  He comes to [place] now from [facility] where he is on 100 mg Seroquel 3 times daily and trazodone 150 mg.   Trazodone has been discontinued and  Seroquel has been changed to as needed since patient is sedated     Poor prognosis - Patient's age, comorbidities, and recent hospitalization for toxic metabolic encephalopathy with repeat admission not long after discharge is  concerning.  Overall prognosis guarded..  Patient is DNR status.  Appreciate palliative care involvement in care    DVT prophylaxis: SQ Lovenox Code Status: DNR Family Communication:  Daughter [name] [phone] 2/19, voicemail left on 2/20, VM left on 2/23 Disposition Plan: Status is: Inpatient Remains inpatient appropriate because: Metabolic encephalopathy of unclear etiology   Level of care: Med-Surg  Consultants:  Palliative care  Procedures:  NG tube  Antimicrobials:   Subjective: Seen and examined.  No family at bedside this morning.  Patient appears fatigued.  Grunting weakly in response to questions.  Objective: Vitals:   08/19/23 1539 08/19/23 2148 08/20/23 0146 08/20/23 0811  BP: 110/63 120/70  125/63  Pulse: 86 92  85  Resp: 16 18  16   Temp: 98.3 F (36.8 C) 97.6 F (36.4 C)  98.5 F (36.9 C)  TempSrc: Oral Oral  Axillary  SpO2: 100% 98%  97%  Weight:   72.8 kg   Height:        Intake/Output Summary (Last 24 hours) at 08/20/2023 1023 Last data filed at 08/20/2023 1009 Gross per 24 hour  Intake --  Output 850 ml  Net -850 ml   Filed Weights   08/18/23 0600 08/19/23 0410 08/20/23 0146  Weight: 70.9 kg 72.8 kg 72.8 kg    Examination:  General exam: Lethargic.  Fatigue. Respiratory system: Bibasilar crackles.  No more breathing.  Room air Cardiovascular system: S1-S2, RRR, no murmurs, no pedal edema Gastrointestinal system: Soft, NT/ND, normal bowel sounds Central nervous system: Lethargic.  Awake.  Aphasic.  Unable to assess orientation Extremities: Decreased power symmetrically.  Unable to ambulate Skin: No rashes, lesions or ulcers Psychiatry: Unable to assess    Data Reviewed: I have personally reviewed following labs and imaging studies  CBC: Recent Labs  Lab 08/14/23 0434 08/15/23 0508 08/16/23 0521 08/17/23 0510 08/18/23 0828  WBC 19.3* 14.2* 9.8 7.8 8.7  NEUTROABS 15.8* 11.8* 7.6 5.7 5.8  HGB 14.7 13.4 13.3 13.0 13.0  HCT 49.3 43.5 42.3 43.6 40.2  MCV 99.2 95.8 94.2 98.4 91.2  PLT 219 212 227 156 228   Basic Metabolic Panel: Recent Labs  Lab 08/15/23 0508 08/15/23 1932 08/16/23 0521 08/16/23 1714  08/17/23 0510 08/18/23 0532 08/19/23 0403 08/20/23 0456  NA 154*  --  151*  --  146* 141 140  --   K 3.0*  --  3.2* 3.3* 3.7 5.3* 3.6  --   CL 119*  --  120*  --  118* 110 108  --   CO2 27  --  24  --  23 22 24   --   GLUCOSE 109*  --  92  --  142* 148* 138*  --   BUN 24*  --  20  --  18 14 14   --   CREATININE 0.43*  --  0.39*  --  0.39* 0.44* 0.39* 0.33*  CALCIUM 8.1*  --  8.0*  --  7.7* 7.8* 7.7*  --   MG 2.2 2.3 2.3  --  2.3 2.3  --   --   PHOS 1.7* 1.9* 2.5  --  2.3* 2.5  --   --    GFR: Estimated Creatinine Clearance: 63 mL/min (A) (by C-G formula based on SCr of 0.33 mg/dL (L)). Liver Function Tests: Recent Labs  Lab 08/14/23 0434 08/15/23 0508 08/16/23 0521 08/17/23 0510 08/18/23 0532  AST 29 23 30 31  42*  ALT 47* 39 34 17 19  ALKPHOS 54 50 49 47 49  BILITOT 0.9 0.8 0.4 0.5 1.0  PROT 5.3* 5.0* 5.1* 4.5* 4.7*  ALBUMIN 2.5* 2.2* 2.1* 2.0* 2.1*   No results for input(s): "LIPASE", "AMYLASE" in the last 168 hours. No results for input(s): "AMMONIA" in the last 168 hours. Coagulation Profile: No results for input(s): "INR", "PROTIME" in the last 168 hours. Cardiac Enzymes: No results for input(s): "CKTOTAL", "CKMB", "CKMBINDEX", "TROPONINI" in the last 168 hours. BNP (last 3 results) No results for input(s): "PROBNP" in the last 8760 hours. HbA1C: No results for input(s): "HGBA1C" in the last 72 hours. CBG: Recent Labs  Lab 08/19/23 1633 08/19/23 2018 08/19/23 2329 08/20/23 0355 08/20/23 0804  GLUCAP 134* 128* 137* 123* 120*   Lipid Profile: No results for input(s): "CHOL", "HDL", "LDLCALC", "TRIG", "CHOLHDL", "LDLDIRECT" in the last 72 hours. Thyroid Function Tests: No results for input(s): "TSH", "T4TOTAL", "FREET4", "T3FREE", "THYROIDAB" in the last 72 hours.  Anemia Panel: No results for input(s): "VITAMINB12", "FOLATE", "FERRITIN", "TIBC", "IRON", "RETICCTPCT" in the last 72 hours. Sepsis Labs: Recent Labs  Lab 08/16/23 0521  PROCALCITON 0.11     Recent Results (from the past 240 hours)  Blood Culture (routine x 2)     Status: None   Collection Time: 08/12/23  2:23 PM   Specimen: BLOOD  Result Value Ref Range Status   Specimen Description BLOOD BLOOD LEFT ARM  Final   Special Requests   Final    BOTTLES DRAWN AEROBIC AND ANAEROBIC Blood Culture adequate volume   Culture   Final    NO GROWTH 5 DAYS Performed at [facility], [address] [place], [place] [address]    Report Status 08/17/2023 FINAL  Final  Resp panel by RT-PCR (RSV, Flu A&B, Covid) Anterior Nasal Swab     Status: None   Collection Time: 08/12/23  2:28 PM   Specimen: Anterior Nasal Swab  Result Value Ref Range Status   SARS Coronavirus 2 by RT PCR NEGATIVE NEGATIVE Final    Comment: (NOTE) SARS-CoV-2 target nucleic acids are NOT DETECTED.  The SARS-CoV-2 RNA is generally detectable in upper respiratory specimens during the acute phase of infection. The lowest concentration of SARS-CoV-2 viral copies this assay can detect is 138 copies/mL. A negative result does not preclude SARS-Cov-2 infection and should not be used as the sole basis for treatment or other patient management decisions. A negative result may occur with  improper specimen collection/handling, submission of specimen other than nasopharyngeal swab, presence of viral mutation(s) within the areas targeted by this assay, and inadequate number of viral copies(<138 copies/mL). A negative result must be combined with clinical observations, patient history, and epidemiological information. The expected result is Negative.  Fact Sheet for Patients:  BloggerCourse.com  Fact Sheet for Healthcare Providers:  SeriousBroker.it  This test is no t yet approved or cleared by the [place] FDA and  has been authorized for detection and/or diagnosis of SARS-CoV-2 by FDA under an Emergency Use Authorization (EUA). This EUA will remain   in effect (meaning this test can be used) for the duration of the COVID-19 declaration under Section 564(b)(1) of the Act, 21 U.S.C.section 360bbb-3(b)(1), unless the authorization is terminated  or revoked sooner.       Influenza A by PCR NEGATIVE NEGATIVE Final   Influenza B by PCR NEGATIVE NEGATIVE Final    Comment: (NOTE) The Xpert Xpress SARS-CoV-2/FLU/RSV plus assay is intended as an aid in the diagnosis of influenza from Nasopharyngeal swab specimens and should not be used as a sole  basis for treatment. Nasal washings and aspirates are unacceptable for Xpert Xpress SARS-CoV-2/FLU/RSV testing.  Fact Sheet for Patients: BloggerCourse.com  Fact Sheet for Healthcare Providers: SeriousBroker.it  This test is not yet approved or cleared by the [place] FDA and has been authorized for detection and/or diagnosis of SARS-CoV-2 by FDA under an Emergency Use Authorization (EUA). This EUA will remain in effect (meaning this test can be used) for the duration of the COVID-19 declaration under Section 564(b)(1) of the Act, 21 U.S.C. section 360bbb-3(b)(1), unless the authorization is terminated or revoked.     Resp Syncytial Virus by PCR NEGATIVE NEGATIVE Final    Comment: (NOTE) Fact Sheet for Patients: BloggerCourse.com  Fact Sheet for Healthcare Providers: SeriousBroker.it  This test is not yet approved or cleared by the [place] FDA and has been authorized for detection and/or diagnosis of SARS-CoV-2 by FDA under an Emergency Use Authorization (EUA). This EUA will remain in effect (meaning this test can be used) for the duration of the COVID-19 declaration under Section 564(b)(1) of the Act, 21 U.S.C. section 360bbb-3(b)(1), unless the authorization is terminated or revoked.  Performed at [facility], [address], [place], [place] [address]   Urine  Culture     Status: Abnormal   Collection Time: 08/12/23  8:55 PM   Specimen: Urine, Random  Result Value Ref Range Status   Specimen Description   Final    URINE, RANDOM Performed at [facility], [address], [place], [place] [address]    Special Requests   Final    NONE Reflexed from [phone] Performed at [facility], [address] [place], [place] [address]    Culture (A)  Final    40,000 COLONIES/mL AEROCOCCUS URINAE Standardized susceptibility testing for this organism is not available. Performed at [facility], [address], [place], [place] [address]    Report Status 08/16/2023 FINAL  Final  Culture, blood (Routine X 2) w Reflex to ID Panel     Status: None   Collection Time: 08/13/23  9:04 PM   Specimen: BLOOD  Result Value Ref Range Status   Specimen Description BLOOD BLOOD RIGHT HAND BLOOD RIGHT ARM  Final   Special Requests   Final    Blood Culture results may not be optimal due to an inadequate volume of blood received in culture bottles   Culture   Final    NO GROWTH 5 DAYS Performed at [facility], [address] [place], [place] [address]    Report Status 08/18/2023 FINAL  Final  MRSA Next Gen by PCR, Nasal     Status: None   Collection Time: 08/14/23  4:00 AM   Specimen: Nasal Mucosa; Nasal Swab  Result Value Ref Range Status   MRSA by PCR Next Gen NOT DETECTED NOT DETECTED Final    Comment: (NOTE) The GeneXpert MRSA Assay (FDA approved for NASAL specimens only), is one component of a comprehensive MRSA colonization surveillance program. It is not intended to diagnose MRSA infection nor to guide or monitor treatment for MRSA infections. Test performance is not FDA approved in patients less than 28 years old. Performed at [facility], [address], [place], [place] [address]          Radiology Studies: DG Abd 1 View Result Date: 08/18/2023 CLINICAL DATA:  Enteric catheter placement EXAM:  ABDOMEN - 1 VIEW COMPARISON:  08/17/2023 FINDINGS: Frontal view of the lower chest and upper abdomen demonstrates enteric catheter  passing below diaphragm, extending along the expected course of the duodenum. Weighted tip projects to the left of midline at the region of the duodenal-jejunal junction. No change since earlier exam. Bowel gas pattern is unremarkable. Patchy consolidation at the left lung base. IMPRESSION: 1. Stable enteric catheter, weighted tip projecting over the region of the duodenal-jejunal junction. Electronically Signed   By: Sharlet Salina M.D.   On: 08/18/2023 21:07        Scheduled Meds:  vitamin C  500 mg Oral BID   aspirin  81 mg Oral Daily   bisacodyl  10 mg Rectal QHS   carbidopa-levodopa  2 tablet Oral TID   cyanocobalamin  1,000 mcg Oral Daily   doxazosin  1 mg Oral Daily   enoxaparin (LOVENOX) injection  40 mg Subcutaneous Q24H   escitalopram  5 mg Oral Daily   feeding supplement (PROSource TF20)  60 mL Per Tube Daily   free water  30 mL Per Tube Q4H   latanoprost  1 drop Both Eyes QHS   multivitamin with minerals  1 tablet Oral Daily   polyethylene glycol  17 g Oral BID   thiamine  100 mg Oral Daily   trimethoprim-polymyxin b  2 drop Both Eyes Q6H   zinc sulfate (50mg  elemental zinc)  220 mg Oral Daily   Continuous Infusions:  feeding supplement (OSMOLITE 1.5 CAL) 1,000 mL (08/18/23 1730)     LOS: 7 days      Tresa Moore, MD [facility]   If 7PM-7AM, please contact night-coverage  08/20/2023, 10:23 AM

## 2023-08-21 DIAGNOSIS — A419 Sepsis, unspecified organism: Secondary | ICD-10-CM | POA: Diagnosis not present

## 2023-08-21 DIAGNOSIS — N390 Urinary tract infection, site not specified: Secondary | ICD-10-CM | POA: Diagnosis not present

## 2023-08-21 LAB — BASIC METABOLIC PANEL
Anion gap: 6 (ref 5–15)
BUN: 15 mg/dL (ref 8–23)
CO2: 26 mmol/L (ref 22–32)
Calcium: 8 mg/dL — ABNORMAL LOW (ref 8.9–10.3)
Chloride: 111 mmol/L (ref 98–111)
Creatinine, Ser: 0.3 mg/dL — ABNORMAL LOW (ref 0.61–1.24)
GFR, Estimated: >60 mL/min (ref >60)
Glucose, Bld: 143 mg/dL — ABNORMAL HIGH (ref 70–99)
Potassium: 4 mmol/L (ref 3.5–5.1)
Sodium: 143 mmol/L (ref 135–145)

## 2023-08-21 LAB — GLUCOSE, CAPILLARY
Glucose-Capillary: 130 mg/dL — ABNORMAL HIGH (ref 70–99)
Glucose-Capillary: 120 mg/dL — ABNORMAL HIGH (ref 70–99)
Glucose-Capillary: 136 mg/dL — ABNORMAL HIGH (ref 70–99)
Glucose-Capillary: 145 mg/dL — ABNORMAL HIGH (ref 70–99)
Glucose-Capillary: 141 mg/dL — ABNORMAL HIGH (ref 70–99)
Glucose-Capillary: 119 mg/dL — ABNORMAL HIGH (ref 70–99)

## 2023-08-21 NOTE — Progress Notes (Signed)
 PROGRESS NOTE    Justin Bird  ZOX:[id number] DOB: [birth year] DOA: 08/12/2023 PCP: Pcp, No    Brief Narrative:   Justin Bird is 83 y.o. 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  Recent hospital course was complicated by urinary retention.  He has been nonverbal since his last hospitalization.  He presents on this admission with worsening mental status, fever, hypoxemia On arrival to the ED Tmax 101, BP 87/45 which improved with fluid resuscitation.  Labs reveal mild lactic acidosis, hypernatremia 158, chloride 120, glucose 138, WBC 15.4, hemoglobin 17.6.  CT head reveals chronic occipital infarct as well as right frontal, ethmoid and maxillary sinus disease.  He received 2 L LR, 500 cc NS, aztreonam, vancomycin, Flagyl.  TRH admitted the patient for sepsis.    Assessment & Plan:   Principal Problem:   Sepsis secondary to UTI [facility]) Active Problems:   Acute metabolic encephalopathy   SNHL (sensory-neural hearing loss), asymmetrical   Conjunctivitis   Parkinson's disease (HCC)   Malnutrition of moderate degree  Sepsis - Criteria met on arrival with: Fever 101, hypotensive, altered mental status, leukocytosis -- Presumed urinary source, CXR showed no acute cardiopulmonary disease - Status post fluid resuscitation.  Blood pressure has improved -- Urine culture yields 40,000 colonies of gram-positive cocci, Aerococcus.  Unclear whether this represents true infection Plan: Completed course of levaquin No further abx at this time Monitor vitals and fever curve No fever noted since discontinuation of abx      Acute metabolic encephalopathy Concern for possible CVA - Likely has baseline dementia exacerbated by hearing loss, hypernatremia and sepsis - Patient remains lethargic and has been evaluated by speech  therapy --Suspect this is multifactorial secondary to rapid discontinuation of Sinemet, infection as above, high dose atypical antipsychotics. --Low suspicion this patient truly did have a recurrent stroke however we are unable to do MRI due to presence of cochlear implant Plan: Diet was advanced to dysphagia 1 with thin liquids on 2/22.  Oral intake has been minimal however.  Will need to continue NGT placement with 3 times daily Sinemet.  Avoid any nonessential sedatives.  Attempt to discontinue the NGT within 7-10 days of placement   Conjunctivitis - Also has purulent discharge bilaterally - Continue with Polytrim OU 4 times daily -- Improved over interval     Sensorineural hearing loss - Chronic, has had prior cochlear implant but this has since been lost. - Needs assistance with communication --Caregiver brought in hearing aids   Parkinson's disease - Diagnosis is somewhat in question.  Appears that Sinemet was initiated for tremors.  In any case the patient now needs Sinemet.  He has NG tube in place and Sinemet can be crushed.  Has been receiving TID since placement of NGT     Hypernatremia Appears secondary to dehydration/intravascular volume depletion.  Sodium improved to 141 on 2/21.  Discontinued D5 water 2/21.  Continue free water flushes via NG tube.  Check intermittent laboratory workup  Unclear baseline - Patient recently had prolonged hospital stay for sepsis, and his son reports during that time he was heavily sedated.  He comes to [place] now from [facility] where he is on 100 mg Seroquel 3 times daily and trazodone 150 mg.   Trazodone has been discontinued and  Seroquel has been changed to as needed since patient is sedated     Poor prognosis -  Patient's age, comorbidities, and recent hospitalization for toxic metabolic encephalopathy with repeat admission not long after discharge is concerning.  Overall prognosis guarded..  Patient is DNR status.  Appreciate  palliative care involvement in care    DVT prophylaxis: SQ Lovenox Code Status: DNR Family Communication: Daughter [name] [phone] 2/19, voicemail left on 2/20, VM left on 2/23; caregiver [name] at bedside 2/24 Disposition Plan: Status is: Inpatient Remains inpatient appropriate because: Metabolic encephalopathy of unclear etiology   Level of care: Med-Surg  Consultants:  Palliative care  Procedures:  NG tube  Antimicrobials:   Subjective: Seen and examined.  Caregiver [name] at bedside.  No acute events overnight.  Patient does respond to questioning with weak grunting  Objective: Vitals:   08/20/23 2357 08/21/23 0610 08/21/23 0725 08/21/23 0806  BP: 119/63  112/62 139/66  Pulse: 90  76 88  Resp: 17  18 17   Temp: 98.2 F (36.8 C)  98.3 F (36.8 C) 98.1 F (36.7 C)  TempSrc: Oral  Oral Oral  SpO2: 96%  97% 96%  Weight:  71.8 kg    Height:        Intake/Output Summary (Last 24 hours) at 08/21/2023 1045 Last data filed at 08/21/2023 0500 Gross per 24 hour  Intake 3558.34 ml  Output 650 ml  Net 2908.34 ml   Filed Weights   08/19/23 0410 08/20/23 0146 08/21/23 0610  Weight: 72.8 kg 72.8 kg 71.8 kg    Examination:  General exam: Appears lethargic and fatigued.  Weak grunting Respiratory system: Bibasilar crackles.  Normal work of breathing.  Room air Cardiovascular system: S1-S2, RRR, no murmurs, no pedal edema Gastrointestinal system: Soft, NT/ND, normal bowel sounds Central nervous system: Lethargic.  Awake.  Aphasic.  Unable to assess orientation Extremities: Decreased power symmetrically.  Unable to ambulate Skin: No rashes, lesions or ulcers Psychiatry: Unable to assess    Data Reviewed: I have personally reviewed following labs and imaging studies  CBC: Recent Labs  Lab 08/15/23 0508 08/16/23 0521 08/17/23 0510 08/18/23 0828  WBC 14.2* 9.8 7.8 8.7  NEUTROABS 11.8* 7.6 5.7 5.8  HGB 13.4 13.3 13.0 13.0  HCT 43.5 42.3 43.6 40.2  MCV 95.8  94.2 98.4 91.2  PLT 212 227 156 228   Basic Metabolic Panel: Recent Labs  Lab 08/15/23 0508 08/15/23 1932 08/16/23 0521 08/16/23 1714 08/17/23 0510 08/18/23 0532 08/19/23 0403 08/20/23 0456 08/21/23 0842  NA 154*  --  151*  --  146* 141 140  --  143  K 3.0*  --  3.2* 3.3* 3.7 5.3* 3.6  --  4.0  CL 119*  --  120*  --  118* 110 108  --  111  CO2 27  --  24  --  23 22 24   --  26  GLUCOSE 109*  --  92  --  142* 148* 138*  --  143*  BUN 24*  --  20  --  18 14 14   --  15  CREATININE 0.43*  --  0.39*  --  0.39* 0.44* 0.39* 0.33* 0.30*  CALCIUM 8.1*  --  8.0*  --  7.7* 7.8* 7.7*  --  8.0*  MG 2.2 2.3 2.3  --  2.3 2.3  --   --   --   PHOS 1.7* 1.9* 2.5  --  2.3* 2.5  --   --   --    GFR: Estimated Creatinine Clearance: 63 mL/min (A) (by C-G formula based on SCr of 0.3 mg/dL (L)).  Liver Function Tests: Recent Labs  Lab 08/15/23 0508 08/16/23 0521 08/17/23 0510 08/18/23 0532  AST 23 30 31  42*  ALT 39 34 17 19  ALKPHOS 50 49 47 49  BILITOT 0.8 0.4 0.5 1.0  PROT 5.0* 5.1* 4.5* 4.7*  ALBUMIN 2.2* 2.1* 2.0* 2.1*   No results for input(s): "LIPASE", "AMYLASE" in the last 168 hours. No results for input(s): "AMMONIA" in the last 168 hours. Coagulation Profile: No results for input(s): "INR", "PROTIME" in the last 168 hours. Cardiac Enzymes: No results for input(s): "CKTOTAL", "CKMB", "CKMBINDEX", "TROPONINI" in the last 168 hours. BNP (last 3 results) No results for input(s): "PROBNP" in the last 8760 hours. HbA1C: No results for input(s): "HGBA1C" in the last 72 hours. CBG: Recent Labs  Lab 08/20/23 1705 08/20/23 2051 08/21/23 0121 08/21/23 0436 08/21/23 0809  GLUCAP 106* 126* 130* 120* 136*   Lipid Profile: No results for input(s): "CHOL", "HDL", "LDLCALC", "TRIG", "CHOLHDL", "LDLDIRECT" in the last 72 hours. Thyroid Function Tests: No results for input(s): "TSH", "T4TOTAL", "FREET4", "T3FREE", "THYROIDAB" in the last 72 hours.  Anemia Panel: No results for  input(s): "VITAMINB12", "FOLATE", "FERRITIN", "TIBC", "IRON", "RETICCTPCT" in the last 72 hours. Sepsis Labs: Recent Labs  Lab 08/16/23 0521  PROCALCITON 0.11    Recent Results (from the past 240 hours)  Blood Culture (routine x 2)     Status: None   Collection Time: 08/12/23  2:23 PM   Specimen: BLOOD  Result Value Ref Range Status   Specimen Description BLOOD BLOOD LEFT ARM  Final   Special Requests   Final    BOTTLES DRAWN AEROBIC AND ANAEROBIC Blood Culture adequate volume   Culture   Final    NO GROWTH 5 DAYS Performed at [facility], [address] [place], [place] [address]    Report Status 08/17/2023 FINAL  Final  Resp panel by RT-PCR (RSV, Flu A&B, Covid) Anterior Nasal Swab     Status: None   Collection Time: 08/12/23  2:28 PM   Specimen: Anterior Nasal Swab  Result Value Ref Range Status   SARS Coronavirus 2 by RT PCR NEGATIVE NEGATIVE Final    Comment: (NOTE) SARS-CoV-2 target nucleic acids are NOT DETECTED.  The SARS-CoV-2 RNA is generally detectable in upper respiratory specimens during the acute phase of infection. The lowest concentration of SARS-CoV-2 viral copies this assay can detect is 138 copies/mL. A negative result does not preclude SARS-Cov-2 infection and should not be used as the sole basis for treatment or other patient management decisions. A negative result may occur with  improper specimen collection/handling, submission of specimen other than nasopharyngeal swab, presence of viral mutation(s) within the areas targeted by this assay, and inadequate number of viral copies(<138 copies/mL). A negative result must be combined with clinical observations, patient history, and epidemiological information. The expected result is Negative.  Fact Sheet for Patients:  BloggerCourse.com  Fact Sheet for Healthcare Providers:  SeriousBroker.it  This test is no t yet approved or cleared by the  [place] FDA and  has been authorized for detection and/or diagnosis of SARS-CoV-2 by FDA under an Emergency Use Authorization (EUA). This EUA will remain  in effect (meaning this test can be used) for the duration of the COVID-19 declaration under Section 564(b)(1) of the Act, 21 U.S.C.section 360bbb-3(b)(1), unless the authorization is terminated  or revoked sooner.       Influenza A by PCR NEGATIVE NEGATIVE Final   Influenza B by PCR NEGATIVE NEGATIVE Final  Comment: (NOTE) The Xpert Xpress SARS-CoV-2/FLU/RSV plus assay is intended as an aid in the diagnosis of influenza from Nasopharyngeal swab specimens and should not be used as a sole basis for treatment. Nasal washings and aspirates are unacceptable for Xpert Xpress SARS-CoV-2/FLU/RSV testing.  Fact Sheet for Patients: BloggerCourse.com  Fact Sheet for Healthcare Providers: SeriousBroker.it  This test is not yet approved or cleared by the [place] FDA and has been authorized for detection and/or diagnosis of SARS-CoV-2 by FDA under an Emergency Use Authorization (EUA). This EUA will remain in effect (meaning this test can be used) for the duration of the COVID-19 declaration under Section 564(b)(1) of the Act, 21 U.S.C. section 360bbb-3(b)(1), unless the authorization is terminated or revoked.     Resp Syncytial Virus by PCR NEGATIVE NEGATIVE Final    Comment: (NOTE) Fact Sheet for Patients: BloggerCourse.com  Fact Sheet for Healthcare Providers: SeriousBroker.it  This test is not yet approved or cleared by the [place] FDA and has been authorized for detection and/or diagnosis of SARS-CoV-2 by FDA under an Emergency Use Authorization (EUA). This EUA will remain in effect (meaning this test can be used) for the duration of the COVID-19 declaration under Section 564(b)(1) of the Act, 21 U.S.C. section  360bbb-3(b)(1), unless the authorization is terminated or revoked.  Performed at [facility], [address], [place], [place] [address]   Urine Culture     Status: Abnormal   Collection Time: 08/12/23  8:55 PM   Specimen: Urine, Random  Result Value Ref Range Status   Specimen Description   Final    URINE, RANDOM Performed at [facility], [address], [place], [place] [address]    Special Requests   Final    NONE Reflexed from [id number] Performed at [facility], [address] [place], [place] [address]    Culture (A)  Final    40,000 COLONIES/mL AEROCOCCUS URINAE Standardized susceptibility testing for this organism is not available. Performed at [facility], [address], [place], [place] [address]    Report Status 08/16/2023 FINAL  Final  Culture, blood (Routine X 2) w Reflex to ID Panel     Status: None   Collection Time: 08/13/23  9:04 PM   Specimen: BLOOD  Result Value Ref Range Status   Specimen Description BLOOD BLOOD RIGHT HAND BLOOD RIGHT ARM  Final   Special Requests   Final    Blood Culture results may not be optimal due to an inadequate volume of blood received in culture bottles   Culture   Final    NO GROWTH 5 DAYS Performed at [facility], [address] [place], [place] [address]    Report Status 08/18/2023 FINAL  Final  MRSA Next Gen by PCR, Nasal     Status: None   Collection Time: 08/14/23  4:00 AM   Specimen: Nasal Mucosa; Nasal Swab  Result Value Ref Range Status   MRSA by PCR Next Gen NOT DETECTED NOT DETECTED Final    Comment: (NOTE) The GeneXpert MRSA Assay (FDA approved for NASAL specimens only), is one component of a comprehensive MRSA colonization surveillance program. It is not intended to diagnose MRSA infection nor to guide or monitor treatment for MRSA infections. Test performance is not FDA approved in patients less than 109 years old. Performed at [facility], [address] [place], [place] [address]          Radiology Studies: No results found.  Scheduled Meds:  vitamin C  500 mg Oral BID   aspirin  81 mg Oral Daily   bisacodyl  10 mg Rectal QHS   carbidopa-levodopa  2 tablet Oral TID   cyanocobalamin  1,000 mcg Oral Daily   doxazosin  1 mg Oral Daily   enoxaparin (LOVENOX) injection  40 mg Subcutaneous Q24H   escitalopram  5 mg Oral Daily   feeding supplement (PROSource TF20)  60 mL Per Tube Daily   free water  30 mL Per Tube Q4H   Gerhardt's butt cream   Topical BID   latanoprost  1 drop Both Eyes QHS   multivitamin with minerals  1 tablet Oral Daily   polyethylene glycol  17 g Oral BID   trimethoprim-polymyxin b  2 drop Both Eyes Q6H   Continuous Infusions:  feeding supplement (OSMOLITE 1.5 CAL) 50 mL/hr at 08/21/23 0500     LOS: 8 days      Tresa Moore, MD [facility]   If 7PM-7AM, please contact night-coverage  08/21/2023, 10:45 AM

## 2023-08-21 NOTE — TOC Progression Note (Signed)
 Transition of Care [facility]) - Progression Note    Patient Details  Name: Justin Bird MRN: [id number] Date of Birth: 17-Jun-1941  Transition [facility] CM/SW Contact  Marlowe Sax, RN Phone Number: 08/21/2023, 12:09 PM  Clinical Narrative:     Confirmed the patient is a long term care resident at [facility], will return at [place]  Expected Discharge Plan: Long Term Nursing Home Barriers to Discharge: No Barriers Identified  Expected Discharge Plan and Services   Discharge Planning Services: CM Consult Post Acute Care Choice: Nursing Home Living arrangements for the past 2 months: Skilled Nursing Facility (Long term Care)                                       Social Determinants of Health (SDOH) Interventions SDOH Screenings   Food Insecurity: Patient Unable To Answer (08/14/2023)  Housing: Patient Unable To Answer (08/14/2023)  Transportation Needs: Patient Unable To Answer (08/14/2023)  Utilities: Patient Unable To Answer (08/14/2023)  Financial Resource Strain: Patient Declined (01/04/2023)   Received from [facility]  Social Connections: Patient Unable To Answer (08/14/2023)  Tobacco Use: Low Risk  (08/12/2023)    Readmission Risk Interventions     No data to display

## 2023-08-21 NOTE — Progress Notes (Signed)
 Speech Language Pathology Treatment: Dysphagia  Patient Details Name: Justin Bird MRN: [id number] DOB: 27-Jun-1941 Today's Date: 08/21/2023 Time: 4098-1191 SLP Time Calculation (min) (ACUTE ONLY): 40 min  Assessment / Plan / Recommendation Clinical Impression  Pt seen for ongoing assessment of swallowing and toleration of recently initiated oral diet -- a Dysphagia level 1 (puree) w/ Nectar liquids. Pt lying in bed w/ eyes closed but opened/alerted w/ verbal/tactile/visual stim. [name], Caregiver, present and helped to engage pt also. Few verbalizations; poorly intelligible/jargon. He looked to this SLP when his name was called and followed through w/ accepting po's when given light stim at lips by spoon. He exhibited min oral confusion.  Baseline Dementia, Parkinson's Dis. Confusion no overt agitation noted; more engagement overall today. Noted per chart, the plan is to "continue with Sinemet and to reevaluate early next week to see if his mentation has improved" and oral intake can be initiated.   No Family present.  On [place] o2 1-2L; afebrile. WBC WNL.   Pt appears to present w/ MOD-SEVERE oropharyngeal phase dysphagia in setting of declined Cognitive status; declined mental status. Pt has Baseline Dementia; Parkinson's Dis per chart notes. ANY Cognitive decline can impact overall awareness/timing of swallow and safety during po tasks which increases risk for aspiration, choking. Pt is at increased risk for aspiration/aspiration pneumonia w/ oral intake at this time. Pt required MOD+ cues to follow through w/ po tasks, oral care -- continues to show improvement. Pt engaged more appropriately w/ increased awareness in re: environment and w/ po trials presented today. NGT present.        Pt was given oral care then small tsp/straw(pinched) trials of Nectar liquids, then tsps of puree. Pt exhibited improved oral awareness w/ more timely opening of mouth for bolus acceptance w/ tactile cues of spoon  at lips. Improved labial seal/closure noted on utensil to accept boluses. Bolus management and control noted w/ lingual movements for bolus A-P transfer/swallowing. Pharyngeal swallowing appreciated w/ fair+ laryngeal excursion. Engaged swallowing appeared appropriate. No overt coughing occurred. Delayed throat clearing x1. Oral clearing of Nectar liquids and purees noted; no anterior leakage nor oral residue occurred. Pt consumed several trials of each consistency but did appear to fatigue after only a few ozs total.  Unsure if pt would be able to meet his nutrition/hydration needs orally.     In setting of pt's presentation at this time, and his Baseline Dementia and Parkinson's Dis., recommend frequent oral care for hygiene and stimulation of swallowing. Recommend continue the Dysphagia level 1 (puree) diet w/ Nectar liquids. Aspiration precautions. FULL Feeding assistance at meals to give support and stim and to monitor his cues. Reduce distractions at meals. Check to ensure oral clearing b/t bites/sips.  ST services can be available for further education re: the Dysphagia diet and precautions.  No further upgrade from this current Dysphagia diet will be recommended at this time for his Discharge d/t his comorbidities including Dementia and Parkinson's Dis and this Acute hospitalization/illness. It is recommend that he return to SNF and through Rehab, he can be given therapeutic trials to assess toleration of any potential upgrade in consistencies of foods/liquids for his diet = this will have to be over Time d/t his comorbidities.    NSG/MD updated on pt's status and presentation; POC. Palliative Care is following for GOC including discussion of PEG placement for Discharge to support of nutrition/hydration at the NH vs other options at Discharge. MD made aware and agreed. Team updated.  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 He was recently hospitalized for toxic metabolic encephalopathy  1/2-08/03/23. His course was complicated by urinary retention. Also noted were increased Meds for Agitation/behavior during the lengthy admission. Per chart notes, "patient had been doing well until family abruptly discontinued his Sinemet for unclear reasons, and he subsequently developed confusion/altered mental status for which he presented to ED. In the ED, MDs assessed pt d/t his increased Agitation indicating concern of "diagnosis to include early stage of Dementia, delirium, vitamin B12 deficiency and abnormal thyroid function. Patient has possibly missed some dose of Sinemet, which may have contributed partially due to withdrawal. Patient received IV Ativan, IV Haldol, IM Zyprexa in ED.". Pt had a Caregiver caring for him in the home prior to recent admissions.     He presents to [facility]-ED this admit for worsening mental status, fever and hypoxemia and dx'd w/ Sepsis.   CXR this admit: No acute cardiopulmonary abnormality.  2. Leftward deviation of the superior trachea. 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  NGT this admit.      SLP Plan  All goals met;Consult other service (comment) [facility])      Recommendations for follow up therapy are one component of a multi-disciplinary discharge planning process, led by the attending physician.  Recommendations may be updated based on patient status, additional functional criteria and insurance authorization.    Recommendations  Diet recommendations: Dysphagia 1 (puree);Nectar-thick liquid Liquids provided via: Teaspoon;Straw;Cup (monitor) Medication Administration: Crushed with puree Supervision: Staff to assist with self feeding;Full supervision/cueing for compensatory strategies Compensations: Minimize environmental distractions;Slow rate;Small sips/bites;Lingual sweep for clearance of  pocketing;Follow solids with liquid;Multiple dry swallows after each bite/sip Postural Changes and/or Swallow Maneuvers: Upright 30-60 min after meal;Seated upright 90 degrees;Out of bed for meals                 (Palliative Care; Dietician) Oral care BID;Oral care before and after PO;Staff/trained caregiver to provide oral care   Frequent or constant Supervision/Assistance Dysphagia, oropharyngeal phase (R13.12) (Dementia; Parkinson's Dis)     All goals met;Consult other service (comment) (Palliiative Care)       Jerilynn Som, MS, CCC-SLP Speech Language Pathologist Rehab Services; [facility] [phone] (ascom) [place]  08/21/2023, 2:00 PM

## 2023-08-21 NOTE — [place] FL2 (Signed)
 [place] MEDICAID FL2 LEVEL OF CARE FORM     IDENTIFICATION  Patient Name: Justin Bird Birthdate: 12-10-1940 Sex: male Admission Date (Current Location): 08/12/2023  [facility] and [place] Number:  Chiropodist and Address:  [facility], [address] [place], [place] [address]      Provider Number: [id number]  Attending Physician Name and Address:  Tresa Moore, MD  Relative Name and Phone Number:  Griffyn Kucinski  Son  Emergency Contact  [phone]    Current Level of Care: Hospital Recommended Level of Care: Skilled Nursing Facility Prior Approval Number:    Date Approved/Denied:   PASRR Number:    Discharge Plan: SNF    Current Diagnoses: Patient Active Problem List   Diagnosis Date Noted   Malnutrition of moderate degree 08/15/2023   Sepsis secondary to UTI (HCC) 08/13/2023   SNHL (sensory-neural hearing loss), asymmetrical 08/13/2023   Conjunctivitis 08/13/2023   Acute metabolic encephalopathy 06/29/2023   Parkinson's disease (HCC)     Orientation RESPIRATION BLADDER Height & Weight        Normal Incontinent Weight: 71.8 kg Height:  5\' 5"  (165.1 cm)  BEHAVIORAL SYMPTOMS/MOOD NEUROLOGICAL BOWEL NUTRITION STATUS      Incontinent Diet (see dc summary)  AMBULATORY STATUS COMMUNICATION OF NEEDS Skin   Extensive Assist Non-Verbally Normal                       Personal Care Assistance Level of Assistance  Bathing, Feeding, Dressing Bathing Assistance: Maximum assistance Feeding assistance: Maximum assistance Dressing Assistance: Maximum assistance     Functional Limitations Info  Hearing, Speech, Sight Sight Info: Impaired Hearing Info: Impaired Speech Info: Impaired    SPECIAL CARE FACTORS FREQUENCY                       Contractures Contractures Info: Not present    Additional Factors Info  Code Status, Allergies Code Status Info: DNR Allergies Info: Opium Poppy (Papaver),  Penicillins, Sulfa Antibiotics, Penicillins, Sulfa Antibiotics           Current Medications (08/21/2023):  This is the current hospital active medication list Current Facility-Administered Medications  Medication Dose Route Frequency Provider Last Rate Last Admin   acetaminophen (TYLENOL) tablet 650 mg  650 mg Oral Q6H PRN Otelia Sergeant, RPH   650 mg at 08/20/23 2251   Or   acetaminophen (TYLENOL) suppository 650 mg  650 mg Rectal Q6H PRN Otelia Sergeant, RPH       ascorbic acid (VITAMIN C) tablet 500 mg  500 mg Oral BID Otelia Sergeant, RPH   500 mg at 08/21/23 1007   aspirin chewable tablet 81 mg  81 mg Oral Daily Otelia Sergeant, RPH   81 mg at 08/21/23 1007   bisacodyl (DULCOLAX) suppository 10 mg  10 mg Rectal Daily PRN Norins, Rosalyn Gess, MD       bisacodyl (DULCOLAX) suppository 10 mg  10 mg Rectal QHS Bari Mantis A, RPH   10 mg at 08/19/23 2059   carbidopa-levodopa (SINEMET IR) 25-100 MG per tablet immediate release 2 tablet  2 tablet Oral TID Otelia Sergeant, RPH   2 tablet at 08/21/23 1007   cyanocobalamin (VITAMIN B12) tablet 1,000 mcg  1,000 mcg Oral Daily Otelia Sergeant, RPH   1,000 mcg at 08/21/23 1007   doxazosin (CARDURA) tablet 1 mg  1 mg Oral Daily Otelia Sergeant, RPH   1  mg at 08/21/23 1007   enoxaparin (LOVENOX) injection 40 mg  40 mg Subcutaneous Q24H Norins, Rosalyn Gess, MD   40 mg at 08/21/23 1008   escitalopram (LEXAPRO) tablet 5 mg  5 mg Oral Daily Otelia Sergeant, RPH   5 mg at 08/21/23 1007   feeding supplement (OSMOLITE 1.5 CAL) liquid 1,000 mL  1,000 mL Per Tube Continuous Agbata, Tochukwu, MD 50 mL/hr at 08/21/23 0500 Infusion Verify at 08/21/23 0500   feeding supplement (PROSource TF20) liquid 60 mL  60 mL Per Tube Daily Agbata, Tochukwu, MD   60 mL at 08/21/23 1009   free water 30 mL  30 mL Per Tube Q4H Agbata, Tochukwu, MD   30 mL at 08/21/23 0800   Gerhardt's butt cream   Topical BID Lolita Patella B, MD   Given at 08/21/23 1008   hydrALAZINE  (APRESOLINE) injection 10 mg  10 mg Intravenous Q6H PRN Dezii, Alexandra, DO       latanoprost (XALATAN) 0.005 % ophthalmic solution 1 drop  1 drop Both Eyes QHS Norins, Rosalyn Gess, MD   1 drop at 08/20/23 2252   multivitamin with minerals tablet 1 tablet  1 tablet Oral Daily Otelia Sergeant, RPH   1 tablet at 08/21/23 1007   polyethylene glycol (MIRALAX / GLYCOLAX) packet 17 g  17 g Oral BID Otelia Sergeant, RPH   17 g at 08/21/23 1007   polyvinyl alcohol (LIQUIFILM TEARS) 1.4 % ophthalmic solution 2 drop  2 drop Both Eyes PRN Norins, Rosalyn Gess, MD       trimethoprim-polymyxin b (POLYTRIM) ophthalmic solution 2 drop  2 drop Both Eyes Q6H Norins, Rosalyn Gess, MD   2 drop at 08/21/23 0503     Discharge Medications: Please see discharge summary for a list of discharge medications.  Relevant Imaging Results:  Relevant Lab Results:   Additional Information SS #: [id number] - Patient's wife lives at [facility]. (Patient LTC at [facility])  Marlowe Sax, RN

## 2023-08-21 NOTE — Plan of Care (Signed)

## 2023-08-21 NOTE — Progress Notes (Addendum)
 Nutrition Follow-up  DOCUMENTATION CODES:   Non-severe (moderate) malnutrition in context of chronic illness  INTERVENTION:   -Continue TF via NGT:   Initiate Osmolite 1.5 @ 50 ml/hr    60 ml Prosource TF daily     30 free water flush every 4 hours   Tube feeding regimen provides 1880 kcal (100% of needs), 95 grams of protein, and 914 ml of H2O. Total free water: 10964 ml daily   -Monitor Mg, K, and Phos and replete as needed secondary to high feeding risk -Continue MVI with minerals daily via tube -100 mg thiamine daily via tube x 7 days -Pharmacy consulted for electrolyte management -Continue 500 mg vitamin C BID -Continue 220 mg zinc sulfate daily x 14 days  NUTRITION DIAGNOSIS:   Moderate Malnutrition related to chronic illness (Parkinson's) as evidenced by mild fat depletion, moderate fat depletion, percent weight loss, mild muscle depletion, moderate muscle depletion.  Ongoing  GOAL:   Patient will meet greater than or equal to 90% of their needs  Met with TF  MONITOR:   Diet advancement, TF tolerance  REASON FOR ASSESSMENT:   Consult Enteral/tube feeding initiation and management  ASSESSMENT:   Pt with medical history significant of hard of hearing, depression, Parkinson's disease, allergy, who presents with altered mental status.  2/17- s/p BSE- NPO 2/18- s/p BSE- NPO, bedside NGT placement unsuccessful 2/19- s/p BSE- NPO, NGT placed by fluoroscopy (gastric), TF initiated 2/21- s/p BSE- NPO 2/22- s/p BSE- advanced to dysphagia 1 diet with thin liquids   Reviewed I/O's: +2.2 L x 24 hours and +11 L since admission  UOP: 1.4 L since admission  Palliative care following for goals of care discussions. Plan to re-assess mental status today. PCT to follow up tomorrow (08/22/23).   Pt very lethargic at time of visit. He did not respond to name being called. Pt preparing to get blood drawn.   Noted diet advancement, however, no meal completion data noted.  At this time, pt not alert enough for PO intake.   Pt with enteral access for feeding via NGT: Osmolite 1.5 infusing at 50 ml/hr (goal rate).   Case discussed with RN, TOC, MD, SLO, and palliative care. Pt will require PEG if family desires full aggressive care.   Medications reviewed and include vitamin C, dulcolax, vitamin B-12, lovenox, and miralax.   Per TOC notes, plan to return to SNF once medically stable.   Labs reviewed: CBGS: 106-145 (inpatient orders for glycemic control are none).    Diet Order:   Diet Order             DIET - DYS 1 Room service appropriate? No; Fluid consistency: Nectar Thick  Diet effective now                   EDUCATION NEEDS:   Not appropriate for education at this time  Skin:  Skin Assessment: Skin Integrity Issues: Skin Integrity Issues:: Other (Comment), Stage II Stage II: buttocks Other: skin tear to lt hand  Last BM:  08/20/23 (type 7)  Height:   Ht Readings from Last 1 Encounters:  08/13/23 5\' 5"  (1.651 m)    Weight:   Wt Readings from Last 1 Encounters:  08/21/23 71.8 kg    Ideal Body Weight:  61.8 kg  BMI:  Body mass index is 26.34 kg/m.  Estimated Nutritional Needs:   Kcal:  1850-2050  Protein:  90-105 grams  Fluid:  > 1.8 L    Justin Bird W,  RD, LDN, CDCES Registered Dietitian III Certified Diabetes Care and Education Specialist If unable to reach this RD, please use "RD Inpatient" group chat on secure chat between hours of 8am-4 pm daily

## 2023-08-22 DIAGNOSIS — E440 Moderate protein-calorie malnutrition: Secondary | ICD-10-CM | POA: Diagnosis not present

## 2023-08-22 DIAGNOSIS — G9341 Metabolic encephalopathy: Secondary | ICD-10-CM | POA: Diagnosis not present

## 2023-08-22 DIAGNOSIS — A419 Sepsis, unspecified organism: Secondary | ICD-10-CM | POA: Diagnosis not present

## 2023-08-22 DIAGNOSIS — N390 Urinary tract infection, site not specified: Secondary | ICD-10-CM | POA: Diagnosis not present

## 2023-08-22 DIAGNOSIS — H903 Sensorineural hearing loss, bilateral: Secondary | ICD-10-CM | POA: Diagnosis not present

## 2023-08-22 LAB — GLUCOSE, CAPILLARY
Glucose-Capillary: 100 mg/dL — ABNORMAL HIGH (ref 70–99)
Glucose-Capillary: 131 mg/dL — ABNORMAL HIGH (ref 70–99)
Glucose-Capillary: 138 mg/dL — ABNORMAL HIGH (ref 70–99)
Glucose-Capillary: 145 mg/dL — ABNORMAL HIGH (ref 70–99)
Glucose-Capillary: 148 mg/dL — ABNORMAL HIGH (ref 70–99)
Glucose-Capillary: 153 mg/dL — ABNORMAL HIGH (ref 70–99)
Glucose-Capillary: 158 mg/dL — ABNORMAL HIGH (ref 70–99)

## 2023-08-22 NOTE — Progress Notes (Signed)
 Palliative Care Progress Note, Assessment & Plan   Patient Name: Justin Bird       Date: 08/22/2023 DOB: June 21, 1941  Age: 83 y.o. MRN#: [id number] Attending Physician: Tresa Moore, MD Primary Care Physician: Pcp, No Admit Date: 08/12/2023  Subjective: Patient is lying in bed in no apparent distress.  NG remains to left nare with mittens in place to both hands.  He is asleep but easily awakens to my presence.  His gaze meets fine but he does not track me.  He makes no vocalizations during my visit.  No family or friends present during my visit.  HPI: 83 y.o. male  with past medical history of depression, Parkinson's tremors, and hard of hearing (cochlear implant) admitted on 08/12/2023 with AMS, fever, and hypoxemia.   Patient is being treated for sepsis secondary to UTI (5-day course of Levaquin), acute metabolic encephalopathy with concern for possible CVA (Seroquel stopped, Sinemet restarted via NG) for which neurology has been consulted, conjunctivitis (Polytrim OU 4 times daily), hypernatremia secondary to dehydration (IVF).   PMT was consulted to support patient with goals of care discussions.   Of note, patient and family are familiar to me as I worked with him during patient's previous extended hospitalization.  Summary of counseling/coordination of care: Extensive chart review completed prior to meeting patient including labs, vital signs, imaging, progress notes, orders, and available advanced directive documents from current and previous encounters.   After reviewing the patient's chart and assessing the patient at bedside, I attempted to speak with the patient in regards to symptom management.  He is unable to participate in a symptom assessment at this time.  Nonverbal signs of  distress not noted.  No adjustment to [facility] needed at this time.  After assessing the patient, I attempted to speak with [name] over the phone.  No answer.  HIPAA compliant voicemail left.  Counseled with attending, SLP therapist in regards to NGT.  Awaiting return phone call from family in order to determine next steps in patient's plan of care.  Physical Exam Vitals reviewed.  Constitutional:      General: He is not in acute distress. HENT:     Head: Normocephalic.     Mouth/Throat:     Mouth: Mucous membranes are moist.  Eyes:     Pupils: Pupils are equal, round, and reactive to light.  Pulmonary:     Effort: Pulmonary effort is normal.  Abdominal:     Palpations: Abdomen is soft.  Musculoskeletal:     Comments: Generalized weakness  Skin:    General: Skin is warm and dry.  Neurological:     Mental Status: He is alert.  Psychiatric:        Mood and Affect: Mood normal.        Behavior: Behavior normal.             Total Time 25 minutes   Time spent includes: Detailed review of medical records (labs, imaging, vital signs), medically appropriate exam (mental status, respiratory, cardiac, skin), discussed with treatment team, counseling and educating patient, family and staff, documenting clinical information, medication management and coordination of care.  Samara Deist L. Bonita Quin, DNP, FNP-BC Palliative  Medicine Team

## 2023-08-22 NOTE — Progress Notes (Signed)
 PROGRESS NOTE    Justin Bird  ZOX:[id number] DOB: [birth year] DOA: 08/12/2023 PCP: Pcp, No    Brief Narrative:   Justin Bird is 83 y.o. 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  Recent hospital course was complicated by urinary retention.  He has been nonverbal since his last hospitalization.  He presents on this admission with worsening mental status, fever, hypoxemia On arrival to the ED Tmax 101, BP 87/45 which improved with fluid resuscitation.  Labs reveal mild lactic acidosis, hypernatremia 158, chloride 120, glucose 138, WBC 15.4, hemoglobin 17.6.  CT head reveals chronic occipital infarct as well as right frontal, ethmoid and maxillary sinus disease.  He received 2 L LR, 500 cc NS, aztreonam, vancomycin, Flagyl.  TRH admitted the patient for sepsis.    Assessment & Plan:   Principal Problem:   Sepsis secondary to UTI [facility]) Active Problems:   Acute metabolic encephalopathy   SNHL (sensory-neural hearing loss), asymmetrical   Conjunctivitis   Parkinson's disease (HCC)   Malnutrition of moderate degree  Sepsis, resolved - Criteria met on arrival with: Fever 101, hypotensive, altered mental status, leukocytosis -- Presumed urinary source, CXR showed no acute cardiopulmonary disease - Status post fluid resuscitation.  Blood pressure has improved -- Urine culture yields 40,000 colonies of gram-positive cocci, Aerococcus.  Unclear whether this represents true infection Plan: Completed course of levaquin No further abx at this time Monitor vitals and fever curve No fever noted since discontinuation of abx  Acute metabolic encephalopathy Concern for possible CVA - Likely has baseline dementia exacerbated by hearing loss, hypernatremia and sepsis - Patient remains lethargic and has been evaluated by  speech therapy --Suspect this is multifactorial secondary to rapid discontinuation of Sinemet, infection as above, high dose atypical antipsychotics. --Low suspicion this patient truly did have a recurrent stroke however we are unable to do MRI due to presence of cochlear implant Plan: Diet was advanced to dysphagia 1 with thin liquids on 2/22.  Oral intake has been minimal however.  Patient will need to have NGT pulled soon as it was placed 2/19 and patient is uncomfortable with it in place.  Would recommend one additional day with tube in place.  Consider discontinuation 2/26.   Conjunctivitis Resolved DC Polytrim drops     Sensorineural hearing loss - Chronic, has had prior cochlear implant but this has since been lost. - Needs assistance with communication --Caregiver brought in hearing aids   Parkinson's disease - Diagnosis is somewhat in question.  Appears that Sinemet was initiated for tremors.  In any case the patient now needs Sinemet.  He has NG tube in place and Sinemet can be crushed.  Has been receiving TID since placement of NGT     Hypernatremia Appears secondary to dehydration/intravascular volume depletion.  Sodium improved to 141 on 2/21.  Discontinued D5 water 2/21.  Continue free water flushes via NG tube.  Encourage PO fluid intake  Unclear baseline - Patient recently had prolonged hospital stay for sepsis, and his son reports during that time he was heavily sedated.  He comes to [place] now from [facility] where he is on 100 mg Seroquel 3 times daily and trazodone 150 mg.   Trazodone has been discontinued and  Seroquel has been changed to as needed since patient is sedated.  Unfortunately mental status has not appreciably improved.  Now suspecting progression of senility and  underlying cognitive decline.  Patient is appropriate for further de-escalation of care and consideration for hospice engagement, if it falls within the family's goals of care.     Poor  prognosis - Patient's age, comorbidities, and recent hospitalization for toxic metabolic encephalopathy with repeat admission not long after discharge is concerning.  Overall prognosis guarded..  Patient is DNR status.  Appreciate palliative care involvement in care    DVT prophylaxis: SQ Lovenox Code Status: DNR Family Communication: Daughter [name] [phone] 2/19, voicemail left on 2/20, VM left on 2/23; caregiver [name] at bedside 2/24.  Unsuccessful attempts to reach family today. Disposition Plan: Status is: Inpatient Remains inpatient appropriate because: Metabolic encephalopathy of unclear etiology   Level of care: Med-Surg  Consultants:  Palliative care  Procedures:  NG tube  Antimicrobials:   Subjective: Seen and examined.  Eating with assistance from patient care tech.  No family or caregiver at bedside this AM.  No acute events overnight.  Objective: Vitals:   08/21/23 1601 08/21/23 2353 08/22/23 0500 08/22/23 0730  BP: 127/68 (!) 143/67  132/82  Pulse: 96 96  88  Resp: 18 17  20   Temp: 99.2 F (37.3 C) 98.3 F (36.8 C)  98.1 F (36.7 C)  TempSrc:    Axillary  SpO2: 100% 95%  95%  Weight:   70.8 kg 68.1 kg  Height:        Intake/Output Summary (Last 24 hours) at 08/22/2023 1219 Last data filed at 08/22/2023 1010 Gross per 24 hour  Intake 1159 ml  Output 1450 ml  Net -291 ml   Filed Weights   08/21/23 0610 08/22/23 0500 08/22/23 0730  Weight: 71.8 kg 70.8 kg 68.1 kg    Examination:  General exam:Appears fatigued and chronically ill Respiratory system: Bibasilar crackles.  Normal work of breathing.  Room air Cardiovascular system: S1-S2, RRR, no murmurs, no pedal edema Gastrointestinal system: Soft, NT/ND, normal bowel sounds Central nervous system: Lethargic.  Awake.  Aphasic.  Unable to assess orientation Extremities: Decreased power symmetrically.  Non ambulatory Skin: No rashes, lesions or ulcers Psychiatry: Unable to assess    Data  Reviewed: I have personally reviewed following labs and imaging studies  CBC: Recent Labs  Lab 08/16/23 0521 08/17/23 0510 08/18/23 0828  WBC 9.8 7.8 8.7  NEUTROABS 7.6 5.7 5.8  HGB 13.3 13.0 13.0  HCT 42.3 43.6 40.2  MCV 94.2 98.4 91.2  PLT 227 156 228   Basic Metabolic Panel: Recent Labs  Lab 08/15/23 1932 08/16/23 0521 08/16/23 0521 08/16/23 1714 08/17/23 0510 08/18/23 0532 08/19/23 0403 08/20/23 0456 08/21/23 0842  NA  --  151*  --   --  146* 141 140  --  143  K  --  3.2*   < > 3.3* 3.7 5.3* 3.6  --  4.0  CL  --  120*  --   --  118* 110 108  --  111  CO2  --  24  --   --  23 22 24   --  26  GLUCOSE  --  92  --   --  142* 148* 138*  --  143*  BUN  --  20  --   --  18 14 14   --  15  CREATININE  --  0.39*   < >  --  0.39* 0.44* 0.39* 0.33* 0.30*  CALCIUM  --  8.0*  --   --  7.7* 7.8* 7.7*  --  8.0*  MG 2.3 2.3  --   --  2.3 2.3  --   --   --   PHOS 1.9* 2.5  --   --  2.3* 2.5  --   --   --    < > = values in this interval not displayed.   GFR: Estimated Creatinine Clearance: 63 mL/min (A) (by C-G formula based on SCr of 0.3 mg/dL (L)). Liver Function Tests: Recent Labs  Lab 08/16/23 0521 08/17/23 0510 08/18/23 0532  AST 30 31 42*  ALT 34 17 19  ALKPHOS 49 47 49  BILITOT 0.4 0.5 1.0  PROT 5.1* 4.5* 4.7*  ALBUMIN 2.1* 2.0* 2.1*   No results for input(s): "LIPASE", "AMYLASE" in the last 168 hours. No results for input(s): "AMMONIA" in the last 168 hours. Coagulation Profile: No results for input(s): "INR", "PROTIME" in the last 168 hours. Cardiac Enzymes: No results for input(s): "CKTOTAL", "CKMB", "CKMBINDEX", "TROPONINI" in the last 168 hours. BNP (last 3 results) No results for input(s): "PROBNP" in the last 8760 hours. HbA1C: No results for input(s): "HGBA1C" in the last 72 hours. CBG: Recent Labs  Lab 08/21/23 2115 08/22/23 0025 08/22/23 0517 08/22/23 0725 08/22/23 1118  GLUCAP 119* 148* 100* 138* 131*   Lipid Profile: No results for  input(s): "CHOL", "HDL", "LDLCALC", "TRIG", "CHOLHDL", "LDLDIRECT" in the last 72 hours. Thyroid Function Tests: No results for input(s): "TSH", "T4TOTAL", "FREET4", "T3FREE", "THYROIDAB" in the last 72 hours.  Anemia Panel: No results for input(s): "VITAMINB12", "FOLATE", "FERRITIN", "TIBC", "IRON", "RETICCTPCT" in the last 72 hours. Sepsis Labs: Recent Labs  Lab 08/16/23 0521  PROCALCITON 0.11    Recent Results (from the past 240 hours)  Blood Culture (routine x 2)     Status: None   Collection Time: 08/12/23  2:23 PM   Specimen: BLOOD  Result Value Ref Range Status   Specimen Description BLOOD BLOOD LEFT ARM  Final   Special Requests   Final    BOTTLES DRAWN AEROBIC AND ANAEROBIC Blood Culture adequate volume   Culture   Final    NO GROWTH 5 DAYS Performed at [facility], [address] [place], [place] [address]    Report Status 08/17/2023 FINAL  Final  Resp panel by RT-PCR (RSV, Flu A&B, Covid) Anterior Nasal Swab     Status: None   Collection Time: 08/12/23  2:28 PM   Specimen: Anterior Nasal Swab  Result Value Ref Range Status   SARS Coronavirus 2 by RT PCR NEGATIVE NEGATIVE Final    Comment: (NOTE) SARS-CoV-2 target nucleic acids are NOT DETECTED.  The SARS-CoV-2 RNA is generally detectable in upper respiratory specimens during the acute phase of infection. The lowest concentration of SARS-CoV-2 viral copies this assay can detect is 138 copies/mL. A negative result does not preclude SARS-Cov-2 infection and should not be used as the sole basis for treatment or other patient management decisions. A negative result may occur with  improper specimen collection/handling, submission of specimen other than nasopharyngeal swab, presence of viral mutation(s) within the areas targeted by this assay, and inadequate number of viral copies(<138 copies/mL). A negative result must be combined with clinical observations, patient history, and  epidemiological information. The expected result is Negative.  Fact Sheet for Patients:  BloggerCourse.com  Fact Sheet for Healthcare Providers:  SeriousBroker.it  This test is no t yet approved or cleared by the [place] FDA and  has been authorized for detection and/or diagnosis of SARS-CoV-2 by FDA under an Emergency Use Authorization (EUA). This EUA will remain  in effect (meaning  this test can be used) for the duration of the COVID-19 declaration under Section 564(b)(1) of the Act, 21 U.S.C.section 360bbb-3(b)(1), unless the authorization is terminated  or revoked sooner.       Influenza A by PCR NEGATIVE NEGATIVE Final   Influenza B by PCR NEGATIVE NEGATIVE Final    Comment: (NOTE) The Xpert Xpress SARS-CoV-2/FLU/RSV plus assay is intended as an aid in the diagnosis of influenza from Nasopharyngeal swab specimens and should not be used as a sole basis for treatment. Nasal washings and aspirates are unacceptable for Xpert Xpress SARS-CoV-2/FLU/RSV testing.  Fact Sheet for Patients: BloggerCourse.com  Fact Sheet for Healthcare Providers: SeriousBroker.it  This test is not yet approved or cleared by the [place] FDA and has been authorized for detection and/or diagnosis of SARS-CoV-2 by FDA under an Emergency Use Authorization (EUA). This EUA will remain in effect (meaning this test can be used) for the duration of the COVID-19 declaration under Section 564(b)(1) of the Act, 21 U.S.C. section 360bbb-3(b)(1), unless the authorization is terminated or revoked.     Resp Syncytial Virus by PCR NEGATIVE NEGATIVE Final    Comment: (NOTE) Fact Sheet for Patients: BloggerCourse.com  Fact Sheet for Healthcare Providers: SeriousBroker.it  This test is not yet approved or cleared by the [place] FDA and has been  authorized for detection and/or diagnosis of SARS-CoV-2 by FDA under an Emergency Use Authorization (EUA). This EUA will remain in effect (meaning this test can be used) for the duration of the COVID-19 declaration under Section 564(b)(1) of the Act, 21 U.S.C. section 360bbb-3(b)(1), unless the authorization is terminated or revoked.  Performed at [facility], [address] [place], [place] [address]   Urine Culture     Status: Abnormal   Collection Time: 08/12/23  8:55 PM   Specimen: Urine, Random  Result Value Ref Range Status   Specimen Description   Final    URINE, RANDOM Performed at [facility], [address] [place], [place] [address]    Special Requests   Final    NONE Reflexed from [id number] Performed at [facility], [address] [place], [place] [address]    Culture (A)  Final    40,000 COLONIES/mL AEROCOCCUS URINAE Standardized susceptibility testing for this organism is not available. Performed at [facility], [address], [place], [place] [address]    Report Status 08/16/2023 FINAL  Final  Culture, blood (Routine X 2) w Reflex to ID Panel     Status: None   Collection Time: 08/13/23  9:04 PM   Specimen: BLOOD  Result Value Ref Range Status   Specimen Description BLOOD BLOOD RIGHT HAND BLOOD RIGHT ARM  Final   Special Requests   Final    Blood Culture results may not be optimal due to an inadequate volume of blood received in culture bottles   Culture   Final    NO GROWTH 5 DAYS Performed at [facility], [address] [place], [place] [address]    Report Status 08/18/2023 FINAL  Final  MRSA Next Gen by PCR, Nasal     Status: None   Collection Time: 08/14/23  4:00 AM   Specimen: Nasal Mucosa; Nasal Swab  Result Value Ref Range Status   MRSA by PCR Next Gen NOT DETECTED NOT DETECTED Final    Comment: (NOTE) The GeneXpert MRSA Assay (FDA approved for NASAL specimens only), is one component of a  comprehensive MRSA colonization surveillance program. It is  not intended to diagnose MRSA infection nor to guide or monitor treatment for MRSA infections. Test performance is not FDA approved in patients less than 23 years old. Performed at [facility], [address] [place], [place] [address]          Radiology Studies: No results found.       Scheduled Meds:  vitamin C  500 mg Oral BID   aspirin  81 mg Oral Daily   bisacodyl  10 mg Rectal QHS   carbidopa-levodopa  2 tablet Oral TID   cyanocobalamin  1,000 mcg Oral Daily   doxazosin  1 mg Oral Daily   enoxaparin (LOVENOX) injection  40 mg Subcutaneous Q24H   escitalopram  5 mg Oral Daily   feeding supplement (PROSource TF20)  60 mL Per Tube Daily   free water  30 mL Per Tube Q4H   Gerhardt's butt cream   Topical BID   latanoprost  1 drop Both Eyes QHS   multivitamin with minerals  1 tablet Oral Daily   polyethylene glycol  17 g Oral BID   trimethoprim-polymyxin b  2 drop Both Eyes Q6H   Continuous Infusions:  feeding supplement (OSMOLITE 1.5 CAL) 1,000 mL (08/22/23 0509)     LOS: 9 days      Tresa Moore, MD [facility]   If 7PM-7AM, please contact night-coverage  08/22/2023, 12:19 PM

## 2023-08-22 NOTE — Plan of Care (Signed)

## 2023-08-22 NOTE — TOC Progression Note (Signed)
 Transition of Care [facility]) - Progression Note    Patient Details  Name: Justin Bird MRN: [id number] Date of Birth: 02-21-1941  Transition of [facility] CM/SW Contact  Marlowe Sax, RN Phone Number: 08/22/2023, 2:23 PM  Clinical Narrative:     TOC continues to follow the patient, he will return to [facility] as a long term patient, when medically ready,   Expected Discharge Plan: Long Term Nursing Home Barriers to Discharge: No Barriers Identified  Expected Discharge Plan and Services   Discharge Planning Services: CM Consult Post Acute Care Choice: Nursing Home Living arrangements for the past 2 months: Skilled Nursing Facility (Long term Care)                                       Social Determinants of Health (SDOH) Interventions SDOH Screenings   Food Insecurity: Patient Unable To Answer (08/14/2023)  Housing: Patient Unable To Answer (08/14/2023)  Transportation Needs: Patient Unable To Answer (08/14/2023)  Utilities: Patient Unable To Answer (08/14/2023)  Financial Resource Strain: Patient Declined (01/04/2023)   Received from [facility]  Social Connections: Patient Unable To Answer (08/14/2023)  Tobacco Use: Low Risk  (08/12/2023)    Readmission Risk Interventions     No data to display

## 2023-08-23 DIAGNOSIS — N390 Urinary tract infection, site not specified: Secondary | ICD-10-CM | POA: Diagnosis not present

## 2023-08-23 DIAGNOSIS — E440 Moderate protein-calorie malnutrition: Secondary | ICD-10-CM | POA: Diagnosis not present

## 2023-08-23 DIAGNOSIS — H903 Sensorineural hearing loss, bilateral: Secondary | ICD-10-CM | POA: Diagnosis not present

## 2023-08-23 DIAGNOSIS — G9341 Metabolic encephalopathy: Secondary | ICD-10-CM | POA: Diagnosis not present

## 2023-08-23 DIAGNOSIS — A419 Sepsis, unspecified organism: Secondary | ICD-10-CM | POA: Diagnosis not present

## 2023-08-23 LAB — CBC WITH DIFFERENTIAL/PLATELET
Abs Immature Granulocytes: 0.14 10*3/uL — ABNORMAL HIGH (ref 0.00–0.07)
Basophils Absolute: 0 10*3/uL (ref 0.0–0.1)
Basophils Relative: 0 %
Eosinophils Absolute: 0.4 10*3/uL (ref 0.0–0.5)
Eosinophils Relative: 3 %
HCT: 39.1 % (ref 39.0–52.0)
Hemoglobin: 12.9 g/dL — ABNORMAL LOW (ref 13.0–17.0)
Immature Granulocytes: 1 %
Lymphocytes Relative: 6 %
Lymphs Abs: 0.8 10*3/uL (ref 0.7–4.0)
MCH: 29.4 pg (ref 26.0–34.0)
MCHC: 33 g/dL (ref 30.0–36.0)
MCV: 89.1 fL (ref 80.0–100.0)
Monocytes Absolute: 0.8 10*3/uL (ref 0.1–1.0)
Monocytes Relative: 6 %
Neutro Abs: 10.7 10*3/uL — ABNORMAL HIGH (ref 1.7–7.7)
Neutrophils Relative %: 84 %
Platelets: 350 10*3/uL (ref 150–400)
RBC: 4.39 MIL/uL (ref 4.22–5.81)
RDW: 13.7 % (ref 11.5–15.5)
WBC: 12.9 10*3/uL — ABNORMAL HIGH (ref 4.0–10.5)
nRBC: 0 % (ref 0.0–0.2)

## 2023-08-23 LAB — GLUCOSE, CAPILLARY
Glucose-Capillary: 134 mg/dL — ABNORMAL HIGH (ref 70–99)
Glucose-Capillary: 137 mg/dL — ABNORMAL HIGH (ref 70–99)
Glucose-Capillary: 138 mg/dL — ABNORMAL HIGH (ref 70–99)
Glucose-Capillary: 139 mg/dL — ABNORMAL HIGH (ref 70–99)
Glucose-Capillary: 141 mg/dL — ABNORMAL HIGH (ref 70–99)
Glucose-Capillary: 149 mg/dL — ABNORMAL HIGH (ref 70–99)

## 2023-08-23 LAB — BASIC METABOLIC PANEL
Anion gap: 6 (ref 5–15)
BUN: 18 mg/dL (ref 8–23)
CO2: 25 mmol/L (ref 22–32)
Calcium: 8.2 mg/dL — ABNORMAL LOW (ref 8.9–10.3)
Chloride: 107 mmol/L (ref 98–111)
Creatinine, Ser: 0.38 mg/dL — ABNORMAL LOW (ref 0.61–1.24)
GFR, Estimated: >60 mL/min (ref >60)
Glucose, Bld: 110 mg/dL — ABNORMAL HIGH (ref 70–99)
Potassium: 4 mmol/L (ref 3.5–5.1)
Sodium: 138 mmol/L (ref 135–145)

## 2023-08-23 LAB — PROCALCITONIN: Procalcitonin: <0.1 ng/mL

## 2023-08-23 LAB — PHOSPHORUS: Phosphorus: 3.3 mg/dL (ref 2.5–4.6)

## 2023-08-23 LAB — MAGNESIUM: Magnesium: 2.1 mg/dL (ref 1.7–2.4)

## 2023-08-23 MED ORDER — ORAL CARE MOUTH RINSE
15.0000 mL | OROMUCOSAL | Status: DC
  Administered 2023-08-23 – 2023-08-25 (×8): 15 mL via OROMUCOSAL

## 2023-08-23 MED ORDER — ORAL CARE MOUTH RINSE: 15.0000 mL | OROMUCOSAL | Status: DC | PRN

## 2023-08-23 NOTE — Progress Notes (Signed)
 Palliative Care Progress Note, Assessment & Plan   Patient Name: Justin Bird       Date: 08/23/2023 DOB: [birth year]  Age: 83 y.o. MRN#: [id number] Attending Physician: Gillis Santa, MD Primary Care Physician: Pcp, No Admit Date: 08/12/2023  Subjective: Is lying in bed in no apparent distress.  He is sleeping but easily awakens to my presence.  He opens his eyes but is unable to track me.  NG remains to left nare with mittens in place.  No family or friends present during my visit.  HPI: 83 y.o. male  with past medical history of depression, Parkinson's tremors, and hard of hearing (cochlear implant) admitted on 08/12/2023 with AMS, fever, and hypoxemia.   Patient is being treated for sepsis secondary to UTI (5-day course of Levaquin), acute metabolic encephalopathy with concern for possible CVA (Seroquel stopped, Sinemet restarted via NG) for which neurology has been consulted, conjunctivitis (Polytrim OU 4 times daily), hypernatremia secondary to dehydration (IVF).   PMT was consulted to support patient with goals of care discussions.   Of note, patient and family are familiar to me as I worked with him during patient's previous extended hospitalization.  Summary of counseling/coordination of care: Extensive chart review completed prior to meeting patient including labs, vital signs, imaging, progress notes, orders, and available advanced directive documents from current and previous encounters.   After reviewing the patient's chart and assessing the patient at bedside, I spoke with patient in regards to symptom management and goals of care.   After meeting with the patient, attempted to speak with his son/HC POA Justin Bird over the phone.  HIPAA compliant voicemail left with PMT contact info.    Addendum 1510: Received phone call from patient's daughter-in-law Justin Bird and Justin Bird.  Medical update given.  Justin Bird and his wife Justin Bird were given space and opportunity to share their thoughts and concerns regarding patient's current medical situation.  They vocalized they would like to speak with neurology since they would like to further know details of patient's neurological prognosis and diagnosis.  I reviewed Dr. Shelbie Hutching note stating that stroke workup is complete and that outpatient neurology follow-up is recommended.  However, family continues to request to speak with neurology.  I notified attending Dr. Lucianne Muss of family's wishes to speak with neurology.  We discussed that patient has had NG tube in place for 7 days.  He is able to tolerated diet though it is not enough to sustain his caloric and hydration needs.  Discussed risk of leaving NG tube in place as it can necrosis and damage tissue.  Family shares they understand that NG tube needs to be removed.  H see POA Justin Bird endorsed agreement for NG tube to be removed.  I further discussed that patient's current functional, neurological, and nutritional status qualify him to enact hospice benefits.  Given that patient has low albumin, has had significant change in his ability to participate in ADLs, and that his current cognitive status requires him to remain bedbound with 24/7 nursing care, I discussed the potential to enact patient's hospice benefits.  Conveyed to family that hospice can follow patient at [facility].  Family endorses they understand that patient could qualify for  hospice but they are not ready to move forward with hospice services at this time.  They would still like to learn more about patient's prognosis.  I advised that the next best step would be for patient to discharge to LTC with outpatient palliative services to follow, with understanding that hospice could be attentionally be needed in the near future.   Family verbalized understanding.  Again, they would like to have more information from neurology as far as prognosis.  After speaking with family, I spoke with Dr. Lucianne Muss over the phone.  Conveyed family's wishes to speak with neurology as well as hope to transfer back to LTC and not start hospice benefits at this time.  Dr. Lucianne Muss advised for NG to remain in place today and to reevaluate with patient/family tomorrow.  PMT will continue to follow and support patient and family throughout his hospitalization.  Physical Exam Vitals reviewed.  Constitutional:      General: He is not in acute distress.    Appearance: He is normal weight.  HENT:     Head: Normocephalic.     Nose: Nose normal.     Mouth/Throat:     Mouth: Mucous membranes are moist.  Eyes:     Pupils: Pupils are equal, round, and reactive to light.  Pulmonary:     Effort: Pulmonary effort is normal.  Abdominal:     Palpations: Abdomen is soft.  Musculoskeletal:     Comments: Does not move extremities to command, generalized weakness  Skin:    General: Skin is warm and dry.  Neurological:     Mental Status: He is alert.  Psychiatric:        Mood and Affect: Mood normal.        Behavior: Behavior normal.             Total Time 80 minutes   Time spent includes: Detailed review of medical records (labs, imaging, vital signs), medically appropriate exam (mental status, respiratory, cardiac, skin), discussed with treatment team, counseling and educating patient, family and staff, documenting clinical information, medication management and coordination of care.  Samara Deist L. Bonita Quin, DNP, FNP-BC Palliative Medicine Team

## 2023-08-23 NOTE — Progress Notes (Signed)
 [facility] Progress Note  Patient: Justin Bird    XLK:[id number]  DOA: 08/12/2023     Date of Service: the patient was seen and examined on 08/23/2023  Chief Complaint  Patient presents with   Fever   Brief hospital course: Mandel Seiden is 83 y.o. 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  Recent hospital course was complicated by urinary retention.  He has been nonverbal since his last hospitalization.  He presents on this admission with worsening mental status, fever, hypoxemia On arrival to the ED Tmax 101, BP 87/45 which improved with fluid resuscitation.  Labs reveal mild lactic acidosis, hypernatremia 158, chloride 120, glucose 138, WBC 15.4, hemoglobin 17.6.  CT head reveals chronic occipital infarct as well as right frontal, ethmoid and maxillary sinus disease.  He received 2 L LR, 500 cc NS, aztreonam, vancomycin, Flagyl.  TRH admitted the patient for sepsis.     Assessment and Plan: Principal Problem:   Sepsis secondary to UTI [facility]) Active Problems:   Acute metabolic encephalopathy   SNHL (sensory-neural hearing loss), asymmetrical   Conjunctivitis   Parkinson's disease (HCC)   Malnutrition of moderate degree   Sepsis, resolved - Criteria met on arrival with: Fever 101, hypotensive, altered mental status, leukocytosis -- Presumed urinary source, CXR showed no acute cardiopulmonary disease - Status post fluid resuscitation.  Blood pressure has improved -- Urine culture yields 40,000 colonies of gram-positive cocci, Aerococcus.  Unclear whether this represents true infection Plan: Completed course of levaquin No further abx at this time Monitor vitals and fever curve No fever noted since discontinuation of abx   Acute metabolic encephalopathy Concern for possible CVA - Likely has baseline  dementia exacerbated by hearing loss, hypernatremia and sepsis - Patient remains lethargic and has been evaluated by speech therapy --Suspect this is multifactorial secondary to rapid discontinuation of Sinemet, infection as above, high dose atypical antipsychotics. --Low suspicion this patient truly did have a recurrent stroke however we are unable to do MRI due to presence of cochlear implant Plan: Diet was advanced to dysphagia 1 with thin liquids on 2/22.  Oral intake has been minimal however.  Patient will need to have NGT pulled soon as it was placed 2/19 and patient is uncomfortable with it in place.  Would recommend one additional day with tube in place.   Consider discontinuation 2/27.   Conjunctivitis: Resolved DC Polytrim drops     Sensorineural hearing loss - Chronic, has had prior cochlear implant but this has since been lost. - Needs assistance with communication --Caregiver brought in hearing aids   Parkinson's disease - Diagnosis is somewhat in question.  Appears that Sinemet was initiated for tremors.  In any case the patient now needs Sinemet.  He has NG tube in place and Sinemet can be crushed.  Has been receiving TID since placement of NGT     Hypernatremia, resolved Appears secondary to dehydration/intravascular volume depletion.  Sodium improved to 141 on 2/21.  Discontinued D5 water 2/21.  Continue free water flushes via NG tube.  Encourage PO fluid intake   Unclear baseline - Patient recently had prolonged hospital stay for sepsis, and his son reports during that time he was heavily sedated.  He comes to [place] now from [facility] where he is on 100 mg Seroquel 3 times daily and trazodone 150 mg.   Trazodone has been discontinued and  Seroquel has been changed to as needed since patient is sedated.  Unfortunately mental status has not appreciably improved.  Now suspecting progression of senility and underlying cognitive decline.  Patient is appropriate for further  de-escalation of care and consideration for hospice engagement, if it falls within the family's goals of care.     Poor prognosis - Patient's age, comorbidities, and recent hospitalization for toxic metabolic encephalopathy with repeat admission not long after discharge is concerning.  Overall prognosis guarded..  Patient is DNR status.  Appreciate palliative care involvement in care   Body mass index is 25.94 kg/m.  Nutrition Problem: Moderate Malnutrition Etiology: chronic illness (Parkinson's) Interventions: Interventions: Tube feeding  Pressure Injury 08/13/23 Buttocks Medial Stage 2 -  Partial thickness loss of dermis presenting as a shallow open injury with a red, pink wound bed without slough. pink, red (Active)  08/13/23 2030  Location: Buttocks  Location Orientation: Medial  Staging: Stage 2 -  Partial thickness loss of dermis presenting as a shallow open injury with a red, pink wound bed without slough.  Wound Description (Comments): pink, red  Present on Admission: Yes  Dressing Type Foam - Lift dressing to assess site every shift 08/22/23 0800     Diet: NG tube feeding DVT Prophylaxis: Subcutaneous Lovenox   Advance goals of care discussion: DNR/DNI-Limited  Family Communication: family was not present at bedside, at the time of rounding. Did not seek caregiver at bedside Previous attending called patient's daughter multiple times and left a voice message, unsuccessful to reach family 2/26 d/w patient's son [name], Kriz, [phone]   Disposition:  Pt is from SNF [facility]-term [facility], admitted with sepsis secondary to UTI, altered mental status. still very obtunded, NG tube feeding was started on 2/18,  which precludes a safe discharge. Discharge to SNF, when stable.  2/26 patient's condition is not great, overall prognosis seems poor.  We will continue current treatment and reassess tomorrow a.m. if no improvement then we may have to discuss for  palliative care and hospice.   Subjective: No significant events overnight, patient was completely obtunded and lethargic, woke up after tapping on his shoulder.  Eyes were open, moving right arm spontaneously.  Unable to follow commands.  Patient was mumbling, hard to understand.   Physical Exam: General: NAD, looks very lethargic and obtunded Eyes: PERRLA ENT: Oral Mucosa Clear, moist  Neck: no JVD,  Cardiovascular: S1 and S2 Present, no Murmur,  Respiratory: good respiratory effort, Bilateral Air entry equal and Decreased, no Crackles, no wheezes Abdomen: Bowel Sound present, Soft and no tenderness,  Skin: no rashes Extremities: no Pedal edema, no calf tenderness Neurologic: Demented, lethargic, spontaneously moving right arm.  Had mittens.  Unable to follow commands.  Gait not checked due to patient safety concerns  Vitals:   08/22/23 1605 08/22/23 2015 08/23/23 0151 08/23/23 0803  BP: (!) 117/59 113/62  116/60  Pulse: 94 95  91  Resp: 16 (!) 21  16  Temp: 98.4 F (36.9 C) 98.6 F (37 C)  99.1 F (37.3 C)  TempSrc: Oral Oral  Oral  SpO2: 96% 94%  94%  Weight:   70.7 kg   Height:        Intake/Output Summary (Last 24 hours) at 08/23/2023 1338 Last data filed at 08/23/2023 0805 Gross per 24 hour  Intake 1279 ml  Output 1200 ml  Net 79 ml   Filed Weights   08/22/23 0500 08/22/23 0730 08/23/23 0151  Weight: 70.8 kg 68.1 kg  70.7 kg    Data Reviewed: I have personally reviewed and interpreted daily labs, tele strips, imagings as discussed above. I reviewed all nursing notes, pharmacy notes, vitals, pertinent old records I have discussed plan of care as described above with RN and patient/family.  CBC: Recent Labs  Lab 08/17/23 0510 08/18/23 0828 08/23/23 0519  WBC 7.8 8.7 12.9*  NEUTROABS 5.7 5.8 10.7*  HGB 13.0 13.0 12.9*  HCT 43.6 40.2 39.1  MCV 98.4 91.2 89.1  PLT 156 228 350   Basic Metabolic Panel: Recent Labs  Lab 08/17/23 0510 08/18/23 0532  08/19/23 0403 08/20/23 0456 08/21/23 0842 08/23/23 0519  NA 146* 141 140  --  143 138  K 3.7 5.3* 3.6  --  4.0 4.0  CL 118* 110 108  --  111 107  CO2 23 22 24   --  26 25  GLUCOSE 142* 148* 138*  --  143* 110*  BUN 18 14 14   --  15 18  CREATININE 0.39* 0.44* 0.39* 0.33* 0.30* 0.38*  CALCIUM 7.7* 7.8* 7.7*  --  8.0* 8.2*  MG 2.3 2.3  --   --   --  2.1  PHOS 2.3* 2.5  --   --   --  3.3    Studies: No results found.  Scheduled Meds:  vitamin C  500 mg Oral BID   aspirin  81 mg Oral Daily   bisacodyl  10 mg Rectal QHS   carbidopa-levodopa  2 tablet Oral TID   cyanocobalamin  1,000 mcg Oral Daily   doxazosin  1 mg Oral Daily   enoxaparin (LOVENOX) injection  40 mg Subcutaneous Q24H   escitalopram  5 mg Oral Daily   feeding supplement (PROSource TF20)  60 mL Per Tube Daily   free water  30 mL Per Tube Q4H   Gerhardt's butt cream   Topical BID   latanoprost  1 drop Both Eyes QHS   multivitamin with minerals  1 tablet Oral Daily   polyethylene glycol  17 g Oral BID   Continuous Infusions:  feeding supplement (OSMOLITE 1.5 CAL) 50 mL/hr at 08/22/23 1522   PRN Meds: acetaminophen **OR** acetaminophen, bisacodyl, hydrALAZINE, polyvinyl alcohol  Time spent: 35 minutes  Author: Gillis Santa. MD [facility] 08/23/2023 1:38 PM  To reach On-call, see care teams to locate the attending and reach out to them via [email or web address]. If 7PM-7AM, please contact night-coverage If you still have difficulty reaching the attending provider, please page the [facility] (Director on Call) for [facility] on amion for assistance. \

## 2023-08-23 NOTE — Plan of Care (Signed)
  Problem: Clinical Measurements: Goal: Diagnostic test results will improve Outcome: Progressing   Problem: Nutrition: Goal: Adequate nutrition will be maintained Outcome: Progressing   Problem: Safety: Goal: Ability to remain free from injury will improve Outcome: Progressing   Problem: Skin Integrity: Goal: Risk for impaired skin integrity will decrease Outcome: Progressing   

## 2023-08-23 NOTE — Plan of Care (Signed)

## 2023-08-24 DIAGNOSIS — Z515 Encounter for palliative care: Secondary | ICD-10-CM | POA: Diagnosis not present

## 2023-08-24 DIAGNOSIS — N390 Urinary tract infection, site not specified: Secondary | ICD-10-CM | POA: Diagnosis not present

## 2023-08-24 DIAGNOSIS — H903 Sensorineural hearing loss, bilateral: Secondary | ICD-10-CM | POA: Diagnosis not present

## 2023-08-24 DIAGNOSIS — A419 Sepsis, unspecified organism: Secondary | ICD-10-CM | POA: Diagnosis not present

## 2023-08-24 DIAGNOSIS — E440 Moderate protein-calorie malnutrition: Secondary | ICD-10-CM | POA: Diagnosis not present

## 2023-08-24 LAB — CBC
HCT: 39.7 % (ref 39.0–52.0)
Hemoglobin: 13 g/dL (ref 13.0–17.0)
MCH: 29.5 pg (ref 26.0–34.0)
MCHC: 32.7 g/dL (ref 30.0–36.0)
MCV: 90.2 fL (ref 80.0–100.0)
Platelets: 354 10*3/uL (ref 150–400)
RBC: 4.4 MIL/uL (ref 4.22–5.81)
RDW: 13.8 % (ref 11.5–15.5)
WBC: 12.5 10*3/uL — ABNORMAL HIGH (ref 4.0–10.5)
nRBC: 0 % (ref 0.0–0.2)

## 2023-08-24 LAB — GLUCOSE, CAPILLARY
Glucose-Capillary: 104 mg/dL — ABNORMAL HIGH (ref 70–99)
Glucose-Capillary: 105 mg/dL — ABNORMAL HIGH (ref 70–99)
Glucose-Capillary: 105 mg/dL — ABNORMAL HIGH (ref 70–99)
Glucose-Capillary: 108 mg/dL — ABNORMAL HIGH (ref 70–99)
Glucose-Capillary: 110 mg/dL — ABNORMAL HIGH (ref 70–99)
Glucose-Capillary: 124 mg/dL — ABNORMAL HIGH (ref 70–99)

## 2023-08-24 LAB — MAGNESIUM: Magnesium: 2.4 mg/dL (ref 1.7–2.4)

## 2023-08-24 LAB — BASIC METABOLIC PANEL
Anion gap: 9 (ref 5–15)
BUN: 17 mg/dL (ref 8–23)
CO2: 24 mmol/L (ref 22–32)
Calcium: 8.2 mg/dL — ABNORMAL LOW (ref 8.9–10.3)
Chloride: 106 mmol/L (ref 98–111)
Creatinine, Ser: 0.39 mg/dL — ABNORMAL LOW (ref 0.61–1.24)
GFR, Estimated: >60 mL/min (ref >60)
Glucose, Bld: 105 mg/dL — ABNORMAL HIGH (ref 70–99)
Potassium: 3.9 mmol/L (ref 3.5–5.1)
Sodium: 139 mmol/L (ref 135–145)

## 2023-08-24 LAB — PHOSPHORUS: Phosphorus: 3.2 mg/dL (ref 2.5–4.6)

## 2023-08-24 NOTE — Progress Notes (Signed)
 Palliative Care Progress Note, Assessment & Plan   Patient Name: Justin Bird       Date: 08/24/2023 DOB: 18-Dec-1940  Age: 83 y.o. MRN#: [id number] Attending Physician: Gillis Santa, MD Primary Care Physician: Pcp, No Admit Date: 08/12/2023  Subjective: Patient is sitting up in bed in no apparent distress with NG tube clamped to left nare.  Mittens remain.  Patient's caregiver [name] is at bedside during my visit.  HPI: 83 y.o. male  with past medical history of depression, Parkinson's tremors, and hard of hearing (cochlear implant) admitted on 08/12/2023 with AMS, fever, and hypoxemia.   Patient is being treated for sepsis secondary to UTI (5-day course of Levaquin), acute metabolic encephalopathy with concern for possible CVA (Seroquel stopped, Sinemet restarted via NG) for which neurology has been consulted, conjunctivitis (Polytrim OU 4 times daily), hypernatremia secondary to dehydration (IVF).   PMT was consulted to support patient with goals of care discussions.   Of note, patient and family are familiar to me as I worked with him during patient's previous extended hospitalization.  Summary of counseling/coordination of care: Extensive chart review completed prior to meeting patient including labs, vital signs, imaging, progress notes, orders, and available advanced directive documents from current and previous encounters.   After reviewing the patient's chart and assessing the patient at bedside, I spoke with patient and his caregiver [name] in regards to symptom management and goals of care.   [name] has brought patient's cochlear implants.  We are hopeful this will help patient with communication.  He remains able to meet my gaze but no longer tracks me in the room.  We discussed removing NG  tube and allowing patient to have a diet.  Patient moaned and tried to raise his head when we discussed removing the tube.  When asked if he is okay with [place] removing the tube and allowing him to eat naturally, he shook his head yes.  Space and opportunity provided for patient and caregiver to voice their concerns.  Caregiver remains in agreement with plan to return to LTC with hospice services to follow.  Reviewed that patient's caloric intake is likely not enough to meet his daily required needs.  However, allowing him to age in place, having food for pleasure, and being in a familiar space where his wife can visit his priority at this time.  No acute palliative needs for today.  PMT will continue to follow and support patient and family throughout his hospitalization.  Physical Exam Vitals reviewed.  Constitutional:      General: He is not in acute distress.    Appearance: He is normal weight.  HENT:     Head: Normocephalic.     Nose: Nose normal.     Mouth/Throat:     Mouth: Mucous membranes are moist.  Eyes:     Pupils: Pupils are equal, round, and reactive to light.  Pulmonary:     Effort: Pulmonary effort is normal.  Abdominal:     Palpations: Abdomen is soft.  Musculoskeletal:     Comments: Generalized weakness  Skin:    General: Skin is warm and dry.  Neurological:     Mental Status: He is alert.  Psychiatric:  Mood and Affect: Mood normal.        Behavior: Behavior normal.             Total Time 35 minutes   Time spent includes: Detailed review of medical records (labs, imaging, vital signs), medically appropriate exam (mental status, respiratory, cardiac, skin), discussed with treatment team, counseling and educating patient, family and staff, documenting clinical information, medication management and coordination of care.  Samara Deist L. Bonita Quin, DNP, FNP-BC Palliative Medicine Team

## 2023-08-24 NOTE — Plan of Care (Signed)

## 2023-08-24 NOTE — Progress Notes (Signed)
 Nutrition Follow-up  DOCUMENTATION CODES:   Non-severe (moderate) malnutrition in context of chronic illness  INTERVENTION:   -Continue dysphagia 1 diet  -Continue MVI with minerals daily -Continue 500 mg vitamin C BID -Continue 220 mg zinc sulfate daily x 14 days -Feeding assistance with meals  -Magic cup TID with meals, each supplement provides 290 kcal and 9 grams of protein   NUTRITION DIAGNOSIS:   Moderate Malnutrition related to chronic illness (Parkinson's) as evidenced by mild fat depletion, moderate fat depletion, percent weight loss, mild muscle depletion, moderate muscle depletion.  Ongoing  GOAL:   Patient will meet greater than or equal to 90% of their needs  Unmet  MONITOR:   Diet advancement, TF tolerance  REASON FOR ASSESSMENT:   Consult Enteral/tube feeding initiation and management  ASSESSMENT:   Pt with medical history significant of hard of hearing, depression, Parkinson's disease, allergy, who presents with altered mental status.  2/17- s/p BSE- NPO 2/18- s/p BSE- NPO, bedside NGT placement unsuccessful 2/19- s/p BSE- NPO, NGT placed by fluoroscopy (gastric), TF initiated 2/21- s/p BSE- NPO 2/22- s/p BSE- advanced to dysphagia 1 diet with thin liquids   Reviewed I/O's: -1.8 L x 24 houra and +9.7 L since admission  UOP: 1.8 L x 24 hours   Per palliative care notes, pt family woul;d like to speak with neurology regarding pt's long term prognosis prior to making further decisions. They are agreeable to remove NGT. Per MD, likely plan to remove today.   Pt currently disconnected from TF due to clogged NGT overnight.   Pt lying in bed at time of visit. He did not arouse to voice or touch. No family present.   Noted breakfast tray on tray table. Pt with minimal oral intake. PO 0-10%.   Per TOC notes, plan to d/c to [facility] once medically stable.   Medications reviewed and include vitamin C, sinemet, vitamin B-12, lexapro, and  miralax.   Labs reviewed: CBGS: 104-141 (inpatient orders for glycemic control are none).    Diet Order:   Diet Order             DIET - DYS 1 Room service appropriate? No; Fluid consistency: Nectar Thick  Diet effective now                   EDUCATION NEEDS:   Not appropriate for education at this time  Skin:  Skin Assessment: Skin Integrity Issues: Skin Integrity Issues:: Other (Comment), Stage II Stage II: buttocks Other: skin tear to lt hand  Last BM:  08/20/23 (type 7)  Height:   Ht Readings from Last 1 Encounters:  08/13/23 5\' 5"  (1.651 m)    Weight:   Wt Readings from Last 1 Encounters:  08/24/23 70.5 kg    Ideal Body Weight:  61.8 kg  BMI:  Body mass index is 25.86 kg/m.  Estimated Nutritional Needs:   Kcal:  1850-2050  Protein:  90-105 grams  Fluid:  > 1.8 L    Levada Schilling, RD, LDN, CDCES Registered Dietitian III Certified Diabetes Care and Education Specialist If unable to reach this RD, please use "RD Inpatient" group chat on secure chat between hours of 8am-4 pm daily

## 2023-08-24 NOTE — TOC Progression Note (Signed)
 Transition of Care [facility]) - Progression Note    Patient Details  Name: Justin Bird MRN: [id number] Date of Birth: 07-19-1940  Transition of Care [facility]) CM/SW Contact  Marlowe Sax, RN Phone Number: 08/24/2023, 11:10 AM  Clinical Narrative:    Patient still has NG tube in place per NP notes, the patient is alert and non verbal Patient continues to have Mittens in place  Plan to return to [facility] LTC at DC  Expected Discharge Plan: Long Term Nursing Home Barriers to Discharge: No Barriers Identified  Expected Discharge Plan and Services   Discharge Planning Services: CM Consult Post Acute Care Choice: Nursing Home Living arrangements for the past 2 months: Skilled Nursing Facility (Long term Care)                                       Social Determinants of Health (SDOH) Interventions SDOH Screenings   Food Insecurity: Patient Unable To Answer (08/14/2023)  Housing: Patient Unable To Answer (08/14/2023)  Transportation Needs: Patient Unable To Answer (08/14/2023)  Utilities: Patient Unable To Answer (08/14/2023)  Financial Resource Strain: Patient Declined (01/04/2023)   Received from [facility]  Social Connections: Patient Unable To Answer (08/14/2023)  Tobacco Use: Low Risk  (08/12/2023)    Readmission Risk Interventions     No data to display

## 2023-08-24 NOTE — Progress Notes (Signed)
 [facility] Progress Note  Patient: Justin Bird    ZOX:[id number]  DOA: 08/12/2023     Date of Service: the patient was seen and examined on 08/24/2023  Chief Complaint  Patient presents with   Fever   Brief hospital course: Justin Bird is 83 y.o. 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  Recent hospital course was complicated by urinary retention.  He has been nonverbal since his last hospitalization.  He presents on this admission with worsening mental status, fever, hypoxemia On arrival to the ED Tmax 101, BP 87/45 which improved with fluid resuscitation.  Labs reveal mild lactic acidosis, hypernatremia 158, chloride 120, glucose 138, WBC 15.4, hemoglobin 17.6.  CT head reveals chronic occipital infarct as well as right frontal, ethmoid and maxillary sinus disease.  He received 2 L LR, 500 cc NS, aztreonam, vancomycin, Flagyl.  [facility] admitted the patient for sepsis.     Assessment and Plan: Principal Problem:   Sepsis secondary to UTI [facility]) Active Problems:   Acute metabolic encephalopathy   SNHL (sensory-neural hearing loss), asymmetrical   Conjunctivitis   Parkinson's disease (HCC)   Malnutrition of moderate degree   Sepsis, resolved - Criteria met on arrival with: Fever 101, hypotensive, altered mental status, leukocytosis -- Presumed urinary source, CXR showed no acute cardiopulmonary disease - Status post fluid resuscitation.  Blood pressure has improved -- Urine culture yields 40,000 colonies of gram-positive cocci, Aerococcus.  Unclear whether this represents true infection Plan: Completed course of levaquin No further abx at this time Monitor vitals and fever curve No fever noted since discontinuation of abx   Acute metabolic encephalopathy H/o Recent CVA, right PCA territory infarct.  CTA  Head: Severe stenosis of the right PCA P2/P3 junction with possible short segment occlusion.  Severe stenosis of the distal P3 segment of the left PCA. Unchanged appearance of right PCA territory infarct. Superior left lower lobe area of consolidation, concerning for pneumonia. - Likely has baseline dementia exacerbated by hearing loss, hypernatremia and sepsis - Patient remains lethargic and has been evaluated by speech therapy --Suspect this is multifactorial secondary to rapid discontinuation of Sinemet, infection as above, high dose atypical antipsychotics. --Low suspicion this patient truly did have a recurrent stroke however we are unable to do MRI due to presence of cochlear implant Plan: Diet was advanced to dysphagia 1 with thin liquids on 2/22.  Oral intake has been minimal however.   S/p NG-tube placed on 2/19 and discontinued on 2/27 Discussed with patient's son over the phone, he agreed with the plan to discharge him to long-term care facility when bed will be available.    Conjunctivitis: Resolved DC Polytrim drops     Sensorineural hearing loss - Chronic, has had prior cochlear implant but this has since been lost. - Needs assistance with communication --Caregiver brought in hearing aids   Parkinson's disease - Diagnosis is somewhat in question.  Appears that Sinemet was initiated for tremors.  In any case the patient now needs Sinemet.  He has NG tube in place and Sinemet can be crushed.  Has been receiving TID since placement of NGT     Hypernatremia, resolved Appears secondary to dehydration/intravascular volume depletion.  Sodium improved to 141 on 2/21.  Discontinued D5 water 2/21.  Continue free water flushes via NG tube.  Encourage PO fluid intake   Unclear baseline - Patient recently had  prolonged hospital stay for sepsis, and his son reports during that time he was heavily sedated.  He comes to [place] now from [facility] where he is on 100 mg Seroquel 3 times  daily and trazodone 150 mg.   Trazodone has been discontinued and  Seroquel has been changed to as needed since patient is sedated.  Unfortunately mental status has not appreciably improved.  Now suspecting progression of senility and underlying cognitive decline.  Patient is appropriate for further de-escalation of care and consideration for hospice engagement, if it falls within the family's goals of care.     Poor prognosis - Patient's age, comorbidities, and recent hospitalization for toxic metabolic encephalopathy with repeat admission not long after discharge is concerning.  Overall prognosis guarded..  Patient is DNR status.  Appreciate palliative care involvement in care   Body mass index is 25.86 kg/m.  Nutrition Problem: Moderate Malnutrition Etiology: chronic illness (Parkinson's) Interventions: Interventions: Tube feeding  Pressure Injury 08/13/23 Buttocks Medial Stage 2 -  Partial thickness loss of dermis presenting as a shallow open injury with a red, pink wound bed without slough. pink, red (Active)  08/13/23 2030  Location: Buttocks  Location Orientation: Medial  Staging: Stage 2 -  Partial thickness loss of dermis presenting as a shallow open injury with a red, pink wound bed without slough.  Wound Description (Comments): pink, red  Present on Admission: Yes  Dressing Type Foam - Lift dressing to assess site every shift 08/23/23 2000     Diet: Dysphagia 1 diet DVT Prophylaxis: Subcutaneous Lovenox   Advance goals of care discussion: DNR/DNI-Limited  Family Communication: family was not present at bedside, at the time of rounding. Did not seek caregiver at bedside Previous attending called patient's daughter multiple times and left a voice message, unsuccessful to reach family 2/26 d/w patient's son Istvan, [place]har [phone] 2/27 discussed with patient's son over the phone, agreed to discontinue NG tube feeding and agreed with the discharge planning to long-term  facility  Disposition:  Pt is from SNF [facility]-term patient [facility], admitted with sepsis secondary to UTI, altered mental status. S/p NG tube feeding d/c'd on 2/27, still has mittens,  which precludes a safe discharge. Discharge to SNF, when bed will be available. 2/27, patient's son agreed with the discharge planning.  Informed to TOC. Patient is to be off mittens.    Subjective: No significant events overnight, patient was completely obtunded and lethargic, making some moaning sounds, condition looks worse than yesterday.  No improvement noticed.  Informed to patient's son over the phone, he agreed with to discontinue NG tube feeding and discharge planning to long-term care facility.   Physical Exam: General: NAD, looks very lethargic and obtunded Eyes: PERRLA ENT: Oral Mucosa dry Neck: no JVD,  Cardiovascular: S1 and S2 Present, no Murmur,  Respiratory: good respiratory effort, Bilateral Air entry equal and Decreased, no Crackles, no wheezes Abdomen: Bowel Sound present, Soft and no tenderness,  Skin: no rashes Extremities: no Pedal edema, no calf tenderness Neurologic: Demented, lethargic, wearing mittens in am.  Unable to follow commands.  Gait not checked due to patient safety concerns  Vitals:   08/23/23 0803 08/24/23 0056 08/24/23 0415 08/24/23 0813  BP: 116/60 112/61  132/76  Pulse: 91 90  85  Resp: 16 16  17   Temp: 99.1 F (37.3 C) 98.3 F (36.8 C)  98.7 F (37.1 C)  TempSrc: Oral     SpO2: 94% 94%  96%  Weight:   70.5 kg  Height:        Intake/Output Summary (Last 24 hours) at 08/24/2023 1556 Last data filed at 08/24/2023 1044 Gross per 24 hour  Intake 0 ml  Output 800 ml  Net -800 ml   Filed Weights   08/22/23 0730 08/23/23 0151 08/24/23 0415  Weight: 68.1 kg 70.7 kg 70.5 kg    Data Reviewed: I have personally reviewed and interpreted daily labs, tele strips, imagings as discussed above. I reviewed all nursing notes, pharmacy notes, vitals,  pertinent old records I have discussed plan of care as described above with RN and patient/family.  CBC: Recent Labs  Lab 08/18/23 0828 08/23/23 0519 08/24/23 0430  WBC 8.7 12.9* 12.5*  NEUTROABS 5.8 10.7*  --   HGB 13.0 12.9* 13.0  HCT 40.2 39.1 39.7  MCV 91.2 89.1 90.2  PLT 228 350 354   Basic Metabolic Panel: Recent Labs  Lab 08/18/23 0532 08/19/23 0403 08/20/23 0456 08/21/23 0842 08/23/23 0519 08/24/23 0430  NA 141 140  --  143 138 139  K 5.3* 3.6  --  4.0 4.0 3.9  CL 110 108  --  111 107 106  CO2 22 24  --  26 25 24   GLUCOSE 148* 138*  --  143* 110* 105*  BUN 14 14  --  15 18 17   CREATININE 0.44* 0.39* 0.33* 0.30* 0.38* 0.39*  CALCIUM 7.8* 7.7*  --  8.0* 8.2* 8.2*  MG 2.3  --   --   --  2.1 2.4  PHOS 2.5  --   --   --  3.3 3.2    Studies: No results found.  Scheduled Meds:  vitamin C  500 mg Oral BID   aspirin  81 mg Oral Daily   bisacodyl  10 mg Rectal QHS   carbidopa-levodopa  2 tablet Oral TID   cyanocobalamin  1,000 mcg Oral Daily   doxazosin  1 mg Oral Daily   enoxaparin (LOVENOX) injection  40 mg Subcutaneous Q24H   escitalopram  5 mg Oral Daily   feeding supplement (PROSource TF20)  60 mL Per Tube Daily   free water  30 mL Per Tube Q4H   Gerhardt's butt cream   Topical BID   latanoprost  1 drop Both Eyes QHS   multivitamin with minerals  1 tablet Oral Daily   mouth rinse  15 mL Mouth Rinse 4 times per day   polyethylene glycol  17 g Oral BID   Continuous Infusions:  feeding supplement (OSMOLITE 1.5 CAL) Stopped (08/24/23 0315)   PRN Meds: acetaminophen **OR** acetaminophen, bisacodyl, hydrALAZINE, mouth rinse, polyvinyl alcohol  Time spent: 35 minutes  Author: Gillis Santa. MD [facility] 08/24/2023 3:56 PM  To reach On-call, see care teams to locate the attending and reach out to them via [email or web address]. If 7PM-7AM, please contact night-coverage If you still have difficulty reaching the attending provider, please page the [facility]  (Director on Call) for [facility] on amion for assistance. \

## 2023-08-24 NOTE — Plan of Care (Signed)
  Problem: Clinical Measurements: Goal: Ability to maintain clinical measurements within normal limits will improve Outcome: Progressing   Problem: Clinical Measurements: Goal: Will remain free from infection Outcome: Progressing   Problem: Clinical Measurements: Goal: Diagnostic test results will improve Outcome: Progressing   Problem: Pain Managment: Goal: General experience of comfort will improve and/or be controlled Outcome: Progressing   Problem: Safety: Goal: Ability to remain free from injury will improve Outcome: Progressing

## 2023-08-24 NOTE — Progress Notes (Signed)
 Patient's [place] NG tube became clogged. Several attempts to aspirate and unclogged unsuccessful. Feedings stopped at 0315 today.

## 2023-08-25 DIAGNOSIS — A419 Sepsis, unspecified organism: Secondary | ICD-10-CM | POA: Diagnosis not present

## 2023-08-25 DIAGNOSIS — N390 Urinary tract infection, site not specified: Secondary | ICD-10-CM | POA: Diagnosis not present

## 2023-08-25 LAB — CBC
HCT: 40.3 % (ref 39.0–52.0)
Hemoglobin: 13.3 g/dL (ref 13.0–17.0)
MCH: 29.4 pg (ref 26.0–34.0)
MCHC: 33 g/dL (ref 30.0–36.0)
MCV: 89 fL (ref 80.0–100.0)
Platelets: 403 10*3/uL — ABNORMAL HIGH (ref 150–400)
RBC: 4.53 MIL/uL (ref 4.22–5.81)
RDW: 13.6 % (ref 11.5–15.5)
WBC: 13.2 10*3/uL — ABNORMAL HIGH (ref 4.0–10.5)
nRBC: 0 % (ref 0.0–0.2)

## 2023-08-25 LAB — BASIC METABOLIC PANEL
Anion gap: 7 (ref 5–15)
BUN: 19 mg/dL (ref 8–23)
CO2: 25 mmol/L (ref 22–32)
Calcium: 8.4 mg/dL — ABNORMAL LOW (ref 8.9–10.3)
Chloride: 107 mmol/L (ref 98–111)
Creatinine, Ser: 0.42 mg/dL — ABNORMAL LOW (ref 0.61–1.24)
GFR, Estimated: >60 mL/min (ref >60)
Glucose, Bld: 97 mg/dL (ref 70–99)
Potassium: 3.7 mmol/L (ref 3.5–5.1)
Sodium: 139 mmol/L (ref 135–145)

## 2023-08-25 LAB — GLUCOSE, CAPILLARY
Glucose-Capillary: 107 mg/dL — ABNORMAL HIGH (ref 70–99)
Glucose-Capillary: 144 mg/dL — ABNORMAL HIGH (ref 70–99)
Glucose-Capillary: 98 mg/dL (ref 70–99)
Glucose-Capillary: 99 mg/dL (ref 70–99)

## 2023-08-25 LAB — PHOSPHORUS: Phosphorus: 3.8 mg/dL (ref 2.5–4.6)

## 2023-08-25 LAB — MAGNESIUM: Magnesium: 2.3 mg/dL (ref 1.7–2.4)

## 2023-08-25 MED ORDER — ASPIRIN 81 MG PO TBEC: 81.0000 mg | DELAYED_RELEASE_TABLET | Freq: Every day | ORAL | Status: DC

## 2023-08-25 NOTE — Discharge Summary (Signed)
 [facility] Discharge Summary   Patient: Justin Bird NGE:[id number]  PCP: Pcp, No  Date of admission: 08/12/2023   Date of discharge:  08/25/2023     Discharge Diagnoses:  Principal Problem:   Sepsis secondary to UTI [facility]) Active Problems:   Acute metabolic encephalopathy   SNHL (sensory-neural hearing loss), asymmetrical   Conjunctivitis   Parkinson's disease (HCC)   Malnutrition of moderate degree   Admitted From: Long Term Nursing Home  Disposition:  Long Term Nursing Home   Recommendations for Outpatient Follow-up:  Follow with PCP, which patient should be seen by an MD in 1 to 2 days, monitor mental status.  Seroquel and trazodone discontinued, can be resumed gradually with lower dose if patient not sleepy and drowsy.  Discontinued Ativan for now, can be resumed at low-dose for anxiety. Follow up LABS/TEST:     Contact information for after-discharge care     Destination     [facility] Preferred SNF .   Service: Skilled Nursing Contact information: [address] [place] [address] [phone]                    Diet recommendation: Regular diet  Activity: The patient is advised to gradually reintroduce usual activities, as tolerated  Discharge Condition: stable  Code Status: DNR /DNI-limited  History of present illness: As per the H and P dictated on admission. Hospital Course:  Justin Bird is 83 y.o. 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  Recent hospital course was complicated by urinary retention.  He has been nonverbal since his last hospitalization.  He presents on this admission with worsening mental status, fever, hypoxemia On arrival to the ED Tmax 101, BP 87/45 which  improved with fluid resuscitation.  Labs reveal mild lactic acidosis, hypernatremia 158, chloride 120, glucose 138, WBC 15.4, hemoglobin 17.6.  CT head reveals chronic occipital infarct as well as right frontal, ethmoid and maxillary sinus disease.  He received 2 L LR, 500 cc NS, aztreonam, vancomycin, Flagyl.  [facility] admitted the patient for sepsis.   Assessment and Plan:   # Sepsis, resolved Criteria met on arrival with: Fever 101, hypotensive, altered mental status, leukocytosis Presumed urinary source, CXR showed no acute cardiopulmonary disease S/p IV fluid resuscitation.  Blood pressure has improved Urine culture yields 40,000 colonies of gram-positive cocci, Aerococcus.  Unclear whether this represents true infection. Pt completed course of levaquin. No further abx at this time  # Acute metabolic encephalopathy H/o Recent CVA, right PCA territory infarct.  CTA Head: Severe stenosis of the right PCA P2/P3 junction with possible short segment occlusion.  Severe stenosis of the distal P3 segment of the left PCA. Unchanged appearance of right PCA territory infarct. Superior left lower lobe area of consolidation, concerning for pneumonia. Likely has baseline dementia exacerbated by hearing loss, hypernatremia and sepsis. Suspect this is multifactorial secondary to rapid discontinuation of Sinemet, infection as above, high dose atypical antipsychotics. Low suspicion this patient truly did have a recurrent stroke however we are unable to do MRI due to presence of cochlear implant Patient has been evaluated by speech therapy, Diet was advanced to dysphagia 1 with thin liquids on 2/22.  Oral intake has been minimal however. S/p NG-tube placed on 2/19 and discontinued on 2/27. Discussed with patient's son over the phone, he  agreed with the plan to discharge him to long-term care facility when bed will be available. # Conjunctivitis: Resolved. DC Polytrim drops # Sensorineural hearing loss: Chronic, has  had prior cochlear implant but this has since been lost. Needs assistance with communication. Caregiver brought in hearing aids # Parkinson's disease: Diagnosis is somewhat in question.  Appears that Sinemet was initiated for tremors. S/p NG tube and Sinemet was resumed via NG tube along with feeding.  Continue Sinemet if patient is able to swallow.  # Hypernatremia: resolved. Appears secondary to dehydration/intravascular volume depletion.  Sodium improved to 141 on 2/21.  Discontinued D5 water 2/21.  Continue free water flushes via NG tube.  Encourage PO fluid intake # Poor prognosis due to multiple comorbidities.  Unclear baseline: Patient recently had prolonged hospital stay for sepsis, and his son reports during that time he was heavily sedated.  He comes to [place] now from [facility] where he was on 100 mg Seroquel 3 times daily and trazodone 150 mg.   Trazodone has been discontinued and  Seroquel was changed to as needed since patient was sedated.  Mental status improved a little bit.  Patient is AO x 1 today.  Palliative care was involved to discuss goals of care discussion with family.  Patient's condition continued to decline then he may qualify for hospice and comfort care.  Body mass index is 25.24 kg/m.  Nutrition Problem: Moderate Malnutrition Etiology: chronic illness (Parkinson's) Nutrition Interventions: Interventions:s/p NG tube feeding, continue dysphagia 1 diet  Pressure Injury 08/13/23 Buttocks Medial Stage 2 -  Partial thickness loss of dermis presenting as a shallow open injury with a red, pink wound bed without slough. pink, red (Active)  08/13/23 2030  Location: Buttocks  Location Orientation: Medial  Staging: Stage 2 -  Partial thickness loss of dermis presenting as a shallow open injury with a red, pink wound bed without slough.  Wound Description (Comments): pink, red  Present on Admission: Yes  Dressing Type Foam - Lift dressing to assess site every shift 08/25/23  0835    Patient was seen by physical therapy, who recommended Therapy, long-term care facility placement, which was arranged. On the day of the discharge the patient's vitals were stable, and no other acute medical condition were reported by patient. the patient was felt safe to be discharge at long-term care facility.  Consultants: Neurology and palliative care Procedures: None  Discharge Exam: General: Appear in no distress, no Rash; Oral Mucosa Clear, moist. Cardiovascular: S1 and S2 Present, no Murmur, Respiratory: normal respiratory effort, Bilateral Air entry present and no Crackles, no wheezes Abdomen: Bowel Sound present, Soft and no tenderness, no hernia Extremities: no Pedal edema, no calf tenderness Neurology: AAOx 1, moving right arm, left arm rigidity and mild weakness.  Unable to follow commands, could not check lower extremities. Affect depressed and demented.  Filed Weights   08/23/23 0151 08/24/23 0415 08/25/23 0500  Weight: 70.7 kg 70.5 kg 68.8 kg   Vitals:   08/24/23 2226 08/25/23 0841  BP: 123/76 (!) 158/78  Pulse: 91 79  Resp: 18 17  Temp: (!) 97.5 F (36.4 C) 98 F (36.7 C)  SpO2: 94% 94%    DISCHARGE MEDICATION: Allergies as of 08/25/2023       Reactions   Opium Poppy [papaver] Anaphylaxis, Hives   Penicillins Anaphylaxis, Rash   Sulfa Antibiotics Anaphylaxis   Penicillins    Sulfa Antibiotics         Medication List  STOP taking these medications    fluticasone 50 MCG/ACT nasal spray Commonly known as: FLONASE   LORazepam 1 MG tablet Commonly known as: ATIVAN   polyvinyl alcohol 1.4 % ophthalmic solution Commonly known as: LIQUIFILM TEARS   QUEtiapine 100 MG tablet Commonly known as: SEROQUEL   traZODone 150 MG tablet Commonly known as: DESYREL       TAKE these medications    acetaminophen 325 MG tablet Commonly known as: TYLENOL Take 2 tablets (650 mg total) by mouth every 6 (six) hours as needed for mild pain (pain  score 1-3) or fever.   aspirin EC 81 MG tablet Take 1 tablet (81 mg total) by mouth daily. Swallow whole.   bisacodyl 5 MG EC tablet Commonly known as: DULCOLAX Take 2 tablets (10 mg total) by mouth at bedtime.   bisacodyl 10 MG suppository Commonly known as: DULCOLAX Place 1 suppository (10 mg total) rectally daily as needed for severe constipation.   carbidopa-levodopa 25-100 MG tablet Commonly known as: SINEMET IR Take 2 tablets by mouth 3 (three) times daily.   cyanocobalamin 1000 MCG tablet Commonly known as: VITAMIN B12 Take 1,000 mcg by mouth daily.   doxazosin 1 MG tablet Commonly known as: CARDURA Take 1 tablet (1 mg total) by mouth daily.   escitalopram 5 MG tablet Commonly known as: LEXAPRO Take 5 mg by mouth daily.   latanoprost 0.005 % ophthalmic solution Commonly known as: XALATAN Place 1 drop into both eyes at bedtime.   polyethylene glycol 17 g packet Commonly known as: MIRALAX / GLYCOLAX Take 17 g by mouth 2 (two) times daily.               Discharge Care Instructions  (From admission, onward)           Start     Ordered   08/25/23 0000  Discharge wound care:       Comments: As above   08/25/23 1447           Allergies  Allergen Reactions   Opium Poppy [Papaver] Anaphylaxis and Hives   Penicillins Anaphylaxis and Rash   Sulfa Antibiotics Anaphylaxis   Penicillins    Sulfa Antibiotics    Discharge Instructions     Ambulatory referral to Neurology   Complete by: As directed    Appt within 2-4 weeks. Severe parkinsonian sx   Call MD for:  difficulty breathing, headache or visual disturbances   Complete by: As directed    Call MD for:  extreme fatigue   Complete by: As directed    Call MD for:  persistant dizziness or light-headedness   Complete by: As directed    Call MD for:  persistant nausea and vomiting   Complete by: As directed    Call MD for:  redness, tenderness, or signs of infection (pain, swelling, redness,  odor or green/yellow discharge around incision site)   Complete by: As directed    Call MD for:  severe uncontrolled pain   Complete by: As directed    Call MD for:  temperature >100.4   Complete by: As directed    Diet general   Complete by: As directed    Discharge instructions   Complete by: As directed    Follow with PCP, which patient should be seen by an MD in 1 to 2 days, monitor mental status.  Seroquel and trazodone discontinued, can be resumed gradually with lower dose if patient not sleepy and drowsy.  Discontinued Ativan for now,  can be resumed at low-dose for anxiety.   Discharge wound care:   Complete by: As directed    As above   Increase activity slowly   Complete by: As directed        The results of significant diagnostics from this hospitalization (including imaging, microbiology, ancillary and laboratory) are listed below for reference.    Significant Diagnostic Studies: DG Abd 1 View Result Date: 08/18/2023 CLINICAL DATA:  Enteric catheter placement EXAM: ABDOMEN - 1 VIEW COMPARISON:  08/17/2023 FINDINGS: Frontal view of the lower chest and upper abdomen demonstrates enteric catheter passing below diaphragm, extending along the expected course of the duodenum. Weighted tip projects to the left of midline at the region of the duodenal-jejunal junction. No change since earlier exam. Bowel gas pattern is unremarkable. Patchy consolidation at the left lung base. IMPRESSION: 1. Stable enteric catheter, weighted tip projecting over the region of the duodenal-jejunal junction. Electronically Signed   By: Sharlet Salina M.D.   On: 08/18/2023 21:07   DG Abd 1 View Result Date: 08/17/2023 CLINICAL DATA:  [id number] Nasogastric tube present [id number] EXAM: ABDOMEN - 1 VIEW COMPARISON:  x-ray abdomen 08/15/2023 FINDINGS: Patient is rotated. Enteric tube coursing below the hemidiaphragm. Tip likely within the fourth portion of the duodenum. The bowel gas pattern is normal. No  radio-opaque calculi or other significant radiographic abnormality are seen. IMPRESSION: Enteric tube with tip likely within the fourth portion of the duodenum. Slightly limited evaluation due to patient rotation. Electronically Signed   By: Tish Frederickson M.D.   On: 08/17/2023 18:20   ECHOCARDIOGRAM COMPLETE Result Date: 08/16/2023    ECHOCARDIOGRAM REPORT   Patient Name:   Justin Bird Date of Exam: 08/16/2023 Medical Rec #:  [id number]         Height:       65.0 in Accession #:    [id number]        Weight:       155.2 lb Date of Birth:  1941-02-07        BSA:          1.776 m Patient Age:    82 years          BP:           116/60 mmHg Patient Gender: M                 HR:           77 bpm. Exam Location:  [facility] Procedure: 2D Echo, Cardiac Doppler, Color Doppler and Saline Contrast Bubble            Study (Both Spectral and Color Flow Doppler were utilized during            procedure). Indications:     Stroke I63.9  History:         Patient has no prior history of Echocardiogram examinations.                  Parkiinsons.  Sonographer:     Cristela Blue Referring Phys:  [id number] AGBATA Diagnosing Phys: Julien Nordmann MD  Sonographer Comments: Suboptimal parasternal window. IMPRESSIONS  1. Left ventricular ejection fraction, by estimation, is 60 to 65%. The left ventricle has normal function. The left ventricle has no regional wall motion abnormalities. There is mild left ventricular hypertrophy. Left ventricular diastolic parameters are consistent with Grade I diastolic dysfunction (impaired relaxation).  2. Right ventricular systolic function is normal. The right ventricular size is normal. There  is normal pulmonary artery systolic pressure. The estimated right ventricular systolic pressure is 35.0 mmHg.  3. A small pericardial effusion is present.  4. The mitral valve is normal in structure. No evidence of mitral valve regurgitation. No evidence of mitral stenosis.  5. The aortic valve is normal in  structure. There is mild calcification of the aortic valve. Aortic valve regurgitation is not visualized. Aortic valve sclerosis/calcification is present, without any evidence of aortic stenosis. Aortic valve mean gradient measures 6.5 mmHg.  6. The inferior vena cava is normal in size with greater than 50% respiratory variability, suggesting right atrial pressure of 3 mmHg. FINDINGS  Left Ventricle: Left ventricular ejection fraction, by estimation, is 60 to 65%. The left ventricle has normal function. The left ventricle has no regional wall motion abnormalities. Strain imaging was not performed. The left ventricular internal cavity  size was normal in size. There is mild left ventricular hypertrophy. Left ventricular diastolic parameters are consistent with Grade I diastolic dysfunction (impaired relaxation). Right Ventricle: The right ventricular size is normal. No increase in right ventricular wall thickness. Right ventricular systolic function is normal. There is normal pulmonary artery systolic pressure. The tricuspid regurgitant velocity is 2.74 m/s, and  with an assumed right atrial pressure of 5 mmHg, the estimated right ventricular systolic pressure is 35.0 mmHg. Left Atrium: Left atrial size was normal in size. Right Atrium: Right atrial size was normal in size. Pericardium: A small pericardial effusion is present. Mitral Valve: The mitral valve is normal in structure. No evidence of mitral valve regurgitation. No evidence of mitral valve stenosis. MV peak gradient, 4.8 mmHg. The mean mitral valve gradient is 3.0 mmHg. Tricuspid Valve: The tricuspid valve is normal in structure. Tricuspid valve regurgitation is not demonstrated. No evidence of tricuspid stenosis. Aortic Valve: The aortic valve is normal in structure. There is mild calcification of the aortic valve. Aortic valve regurgitation is not visualized. Aortic valve sclerosis/calcification is present, without any evidence of aortic stenosis. Aortic  valve mean gradient measures 6.5 mmHg. Aortic valve peak gradient measures 11.2 mmHg. Aortic valve area, by VTI measures 4.33 cm. Pulmonic Valve: The pulmonic valve was normal in structure. Pulmonic valve regurgitation is not visualized. No evidence of pulmonic stenosis. Aorta: The aortic root is normal in size and structure. Venous: The inferior vena cava is normal in size with greater than 50% respiratory variability, suggesting right atrial pressure of 3 mmHg. IAS/Shunts: No atrial level shunt detected by color flow Doppler. Agitated saline contrast was given intravenously to evaluate for intracardiac shunting. Additional Comments: 3D imaging was not performed.  LEFT VENTRICLE PLAX 2D LVIDd:         4.10 cm   Diastology LVIDs:         2.30 cm   LV e' medial:    7.62 cm/s LV PW:         1.20 cm   LV E/e' medial:  10.2 LV IVS:        1.10 cm   LV e' lateral:   8.16 cm/s LVOT diam:     2.10 cm   LV E/e' lateral: 9.5 LV SV:         125 LV SV Index:   70 LVOT Area:     3.46 cm  RIGHT VENTRICLE RV Basal diam:  2.20 cm RV Mid diam:    2.00 cm RV S prime:     16.80 cm/s TAPSE (M-mode): 2.1 cm LEFT ATRIUM  Index        RIGHT ATRIUM          Index LA diam:      4.40 cm 2.48 cm/m   RA Area:     9.60 cm LA Vol (A2C): 41.1 ml 23.14 ml/m  RA Volume:   16.90 ml 9.52 ml/m LA Vol (A4C): 19.9 ml 11.20 ml/m  AORTIC VALVE AV Area (Vmax):    3.98 cm AV Area (Vmean):   4.03 cm AV Area (VTI):     4.33 cm AV Vmax:           167.00 cm/s AV Vmean:          117.000 cm/s AV VTI:            0.289 m AV Peak Grad:      11.2 mmHg AV Mean Grad:      6.5 mmHg LVOT Vmax:         192.00 cm/s LVOT Vmean:        136.000 cm/s LVOT VTI:          0.361 m LVOT/AV VTI ratio: 1.25  AORTA Ao Root diam: 3.70 cm MITRAL VALVE                TRICUSPID VALVE MV Area (PHT): 4.10 cm     TR Peak grad:   30.0 mmHg MV Area VTI:   5.04 cm     TR Vmax:        274.00 cm/s MV Peak grad:  4.8 mmHg MV Mean grad:  3.0 mmHg     SHUNTS MV Vmax:       1.09  m/s     Systemic VTI:  0.36 m MV Vmean:      77.8 cm/s    Systemic Diam: 2.10 cm MV Decel Time: 185 msec MV E velocity: 77.60 cm/s MV A velocity: 122.00 cm/s MV E/A ratio:  0.64 Julien Nordmann MD Electronically signed by Julien Nordmann MD Signature Date/Time: 08/16/2023/3:33:53 PM    Final    DG Naso G Tube Plc W/Fl W/Rad Result Date: 08/16/2023 CLINICAL DATA:  Stroke. Dysphagia. Failed feeding tube placement on the floor. EXAM: NASO G TUBE PLACEMENT WITH FL AND WITH RAD CONTRAST:  None. FLUOROSCOPY: Radiation Exposure Index (as provided by the fluoroscopic device): 44.6 mGy Kerma COMPARISON:  None Available. PROCEDURE/FINDINGS: The Dobbhoff feeding tube was lubricated with viscous lidocaine and inserted into the left nostril. Under fluoroscopic guidance, the feeding tube was advanced into stomach and duodenum, with the tip terminating near the junction of the second and third portions of the duodenum. The tube was affixed to the patient's nose with tape. The patient tolerated the procedure well without immediate postprocedural complication. IMPRESSION: Successful fluoroscopic guided placement of Dobbhoff feeding tube with tip in the duodenum. The tube is ready for immediate use. Electronically Signed   By: Obie Dredge M.D.   On: 08/16/2023 12:25   CT ANGIO HEAD NECK W WO CM Result Date: 08/15/2023 CLINICAL DATA:  Stroke follow-up EXAM: CT ANGIOGRAPHY HEAD AND NECK WITH AND WITHOUT CONTRAST TECHNIQUE: Multidetector CT imaging of the head and neck was performed using the standard protocol during bolus administration of intravenous contrast. Multiplanar CT image reconstructions and MIPs were obtained to evaluate the vascular anatomy. Carotid stenosis measurements (when applicable) are obtained utilizing NASCET criteria, using the distal internal carotid diameter as the denominator. RADIATION DOSE REDUCTION: This exam was performed according to the departmental dose-optimization program which includes  automated exposure control,  adjustment of the mA and/or kV according to patient size and/or use of iterative reconstruction technique. CONTRAST:  75mL OMNIPAQUE IOHEXOL 350 MG/ML SOLN COMPARISON:  08/12/2023 FINDINGS: Brain: Unchanged appearance of right PCA territory infarct. White matter hypoattenuation consistent with chronic small vessel disease. No acute intracranial hemorrhage. Mild volume loss. Streak artifacts from left-sided cochlear implant obscure parts of the left temporal and parietal lobes. Vascular: No hyperdense vessel or unexpected vascular calcification. Skull: The visualized skull base, calvarium and extracranial soft tissues are normal. Sinuses/Orbits: Moderate right maxillary and frontal sinus mucosal thickening. Other: None. CTA NECK FINDINGS Skeleton: No acute abnormality or high grade bony spinal canal stenosis. Other neck: Heterogeneous and enlarged thyroid gland. Upper chest: Superior left lower lobe area of consolidation. Aortic arch: There is calcific atherosclerosis of the aortic arch. Conventional 3 vessel aortic branching pattern. RIGHT carotid system: No dissection, occlusion or aneurysm. Mild atherosclerotic calcification at the carotid bifurcation without hemodynamically significant stenosis. LEFT carotid system: No dissection, occlusion or aneurysm. Mild atherosclerotic calcification at the carotid bifurcation without hemodynamically significant stenosis. Vertebral arteries: Left dominant configuration. There is no dissection, occlusion or flow-limiting stenosis to the skull base (V1-V3 segments). CTA HEAD FINDINGS POSTERIOR CIRCULATION: Vertebral arteries are normal. No proximal occlusion of the anterior or inferior cerebellar arteries. Basilar artery is normal. Superior cerebellar arteries are normal. There is severe stenosis of the right PCA P2/P3 junction with possible short segment occlusion. Severe stenosis of the distal P3 segment of the left PCA. The left PCA is  predominantly supplied by the posterior communicating artery. ANTERIOR CIRCULATION: Intracranial internal carotid arteries are normal. Anterior cerebral arteries are normal. Middle cerebral arteries are normal. Venous sinuses: As permitted by contrast timing, patent. Anatomic variants: None Review of the MIP images confirms the above findings. IMPRESSION: 1. 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 2. Severe stenosis of the distal P3 segment of the left PCA. 3. Unchanged appearance of right PCA territory infarct. 4. Superior left lower lobe area of consolidation, concerning for pneumonia. Electronically Signed   By: Deatra Robinson M.D.   On: 08/15/2023 22:56   DG Abd 1 View Result Date: 08/15/2023 CLINICAL DATA:  Check gastric catheter placement EXAM: ABDOMEN - 1 VIEW COMPARISON:  None Available. FINDINGS: Gastric catheter is noted with the tip extending into the left lower lobe. This should be withdrawn completely and readvanced. IMPRESSION: Gastric catheter within the left lower lobe bronchus. This should be completely withdrawn and readvanced. These results will be called to the ordering clinician or representative by the Radiologist Assistant, and communication documented in the PACS or [employer]. Electronically Signed   By: Alcide Clever M.D.   On: 08/15/2023 19:12   CT Head Wo Contrast Result Date: 08/12/2023 CLINICAL DATA:  Altered mental status. EXAM: CT HEAD WITHOUT CONTRAST TECHNIQUE: Contiguous axial images were obtained from the base of the skull through the vertex without intravenous contrast. RADIATION DOSE REDUCTION: This exam was performed according to the departmental dose-optimization program which includes automated exposure control, adjustment of the mA and/or kV according to patient size and/or use of iterative reconstruction technique. COMPARISON:  CT head dated 06/29/2023. FINDINGS: Streak artifact from a left-sided cochlear implant and  motion artifact limit the sensitivity of the exam. Brain: A moderate volume area of hypoattenuation of the right occipital lobe in the posterior cerebral artery territory is favored to reflect chronic infarct. This is new or progressed since 06/29/2023. No evidence of acute hemorrhage, hydrocephalus, extra-axial collection  or mass lesion/mass effect. There is mild cerebral volume loss with associated ex vacuo dilatation. Periventricular white matter hypoattenuation likely represents chronic small vessel ischemic disease. 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 Vascular: There are vascular calcifications in the carotid siphons. Skull: Status post left mastoidectomy and cochlear implant. Negative for fracture or focal lesion. Sinuses/Orbits: There is right frontal, ethmoid, and maxillary sinus disease. Other: None. IMPRESSION: 1. Moderate volume area of hypoattenuation of the right occipital lobe in the posterior cerebral artery territory is favored to reflect chronic infarct. This is new or progressed since 06/29/2023. 2. Right frontal, ethmoid, and maxillary sinus disease. Electronically Signed   By: Romona Curls M.D.   On: 08/12/2023 15:11   DG Chest Port 1 View Result Date: 08/12/2023 CLINICAL DATA:  Questionable sepsis - evaluate for abnormality EXAM: PORTABLE CHEST 1 VIEW COMPARISON:  None Available. FINDINGS: The cardiomediastinal silhouette is mildly enlarged in contour.Atherosclerotic calcifications. No pleural effusion. No pneumothorax. No acute pleuroparenchymal abnormality. Leftward deviation of the superior trachea. IMPRESSION: 1. No acute cardiopulmonary abnormality. 2. Leftward deviation of the superior trachea. This could reflect a thyroid goiter. This could be further assessed with dedicated nonemergent outpatient thyroid ultrasound. Electronically Signed   By: Meda Klinefelter M.D.   On: 08/12/2023 14:49    Microbiology: No results found for this  or any previous visit (from the past 240 hours).   Labs: CBC: Recent Labs  Lab 08/23/23 0519 08/24/23 0430 08/25/23 0426  WBC 12.9* 12.5* 13.2*  NEUTROABS 10.7*  --   --   HGB 12.9* 13.0 13.3  HCT 39.1 39.7 40.3  MCV 89.1 90.2 89.0  PLT 350 354 403*   Basic Metabolic Panel: Recent Labs  Lab 08/19/23 0403 08/20/23 0456 08/21/23 0842 08/23/23 0519 08/24/23 0430 08/25/23 0426  NA 140  --  143 138 139 139  K 3.6  --  4.0 4.0 3.9 3.7  CL 108  --  111 107 106 107  CO2 24  --  26 25 24 25   GLUCOSE 138*  --  143* 110* 105* 97  BUN 14  --  15 18 17 19   CREATININE 0.39* 0.33* 0.30* 0.38* 0.39* 0.42*  CALCIUM 7.7*  --  8.0* 8.2* 8.2* 8.4*  MG  --   --   --  2.1 2.4 2.3  PHOS  --   --   --  3.3 3.2 3.8   Liver Function Tests: No results for input(s): "AST", "ALT", "ALKPHOS", "BILITOT", "PROT", "ALBUMIN" in the last 168 hours. No results for input(s): "LIPASE", "AMYLASE" in the last 168 hours. No results for input(s): "AMMONIA" in the last 168 hours. Cardiac Enzymes: No results for input(s): "CKTOTAL", "CKMB", "CKMBINDEX", "TROPONINI" in the last 168 hours. BNP (last 3 results) No results for input(s): "BNP" in the last 8760 hours. CBG: Recent Labs  Lab 08/24/23 1950 08/25/23 0005 08/25/23 0414 08/25/23 0838 08/25/23 1145  GLUCAP 108* 107* 98 99 144*    Time spent: 35 minutes  Signed:  Gillis Santa  [facility] 08/25/2023 3:02 PM

## 2023-08-25 NOTE — Progress Notes (Signed)
 [facility] Progress Note  Patient: Justin Bird    FAO:[id number]  DOA: 08/12/2023     Date of Service: the patient was seen and examined on 08/25/2023  Chief Complaint  Patient presents with   Fever   Brief hospital course: Justin Bird is 83 y.o. 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  Recent hospital course was complicated by urinary retention.  He has been nonverbal since his last hospitalization.  He presents on this admission with worsening mental status, fever, hypoxemia On arrival to the ED Tmax 101, BP 87/45 which improved with fluid resuscitation.  Labs reveal mild lactic acidosis, hypernatremia 158, chloride 120, glucose 138, WBC 15.4, hemoglobin 17.6.  CT head reveals chronic occipital infarct as well as right frontal, ethmoid and maxillary sinus disease.  He received 2 L LR, 500 cc NS, aztreonam, vancomycin, Flagyl.  TR[facility] admitted the patient for sepsis.     Assessment and Plan: Principal Problem:   Sepsis secondary to UTI [name]) Active Problems:   Acute metabolic encephalopathy   SNHL (sensory-neural hearing loss), asymmetrical   Conjunctivitis   Parkinson's disease (HCC)   Malnutrition of moderate degree   Sepsis, resolved - Criteria met on arrival with: Fever 101, hypotensive, altered mental status, leukocytosis -- Presumed urinary source, CXR showed no acute cardiopulmonary disease - Status post fluid resuscitation.  Blood pressure has improved -- Urine culture yields 40,000 colonies of gram-positive cocci, Aerococcus.  Unclear whether this represents true infection Plan: Completed course of levaquin No further abx at this time Monitor vitals and fever curve No fever noted since discontinuation of abx   Acute metabolic encephalopathy H/o Recent CVA, right PCA territory infarct.  CTA  Head: Severe stenosis of the right PCA P2/P3 junction with possible short segment occlusion.  Severe stenosis of the distal P3 segment of the left PCA. Unchanged appearance of right PCA territory infarct. Superior left lower lobe area of consolidation, concerning for pneumonia. - Likely has baseline dementia exacerbated by hearing loss, hypernatremia and sepsis - Patient remains lethargic and has been evaluated by speech therapy --Suspect this is multifactorial secondary to rapid discontinuation of Sinemet, infection as above, high dose atypical antipsychotics. --Low suspicion this patient truly did have a recurrent stroke however we are unable to do MRI due to presence of cochlear implant Plan: Diet was advanced to dysphagia 1 with thin liquids on 2/22.  Oral intake has been minimal however.   S/p NG-tube placed on 2/19 and discontinued on 2/27 Discussed with patient's son over the phone, he agreed with the plan to discharge him to long-term care facility when bed will be available.    Conjunctivitis: Resolved DC Polytrim drops     Sensorineural hearing loss - Chronic, has had prior cochlear implant but this has since been lost. - Needs assistance with communication --Caregiver brought in hearing aids   Parkinson's disease - Diagnosis is somewhat in question.  Appears that Sinemet was initiated for tremors.  In any case the patient now needs Sinemet.  He has NG tube in place and Sinemet can be crushed.  Has been receiving TID since placement of NGT     Hypernatremia, resolved Appears secondary to dehydration/intravascular volume depletion.  Sodium improved to 141 on 2/21.  Discontinued D5 water 2/21.  Continue free water flushes via NG tube.  Encourage PO fluid intake   Unclear baseline - Patient recently had  prolonged hospital stay for sepsis, and his son reports during that time he was heavily sedated.  He comes to [place] now from [facility] where he is on 100 mg Seroquel 3 times  daily and trazodone 150 mg.   Trazodone has been discontinued and  Seroquel has been changed to as needed since patient is sedated.  Unfortunately mental status has not appreciably improved.  Now suspecting progression of senility and underlying cognitive decline.  Patient is appropriate for further de-escalation of care and consideration for hospice engagement, if it falls within the family's goals of care.     Poor prognosis - Patient's age, comorbidities, and recent hospitalization for toxic metabolic encephalopathy with repeat admission not long after discharge is concerning.  Overall prognosis guarded..  Patient is DNR status.  Appreciate palliative care involvement in care   Body mass index is 25.24 kg/m.  Nutrition Problem: Moderate Malnutrition Etiology: chronic illness (Parkinson's) Interventions: Interventions: Tube feeding  Pressure Injury 08/13/23 Buttocks Medial Stage 2 -  Partial thickness loss of dermis presenting as a shallow open injury with a red, pink wound bed without slough. pink, red (Active)  08/13/23 2030  Location: Buttocks  Location Orientation: Medial  Staging: Stage 2 -  Partial thickness loss of dermis presenting as a shallow open injury with a red, pink wound bed without slough.  Wound Description (Comments): pink, red  Present on Admission: Yes  Dressing Type Foam - Lift dressing to assess site every shift 08/25/23 0835     Diet: Dysphagia 1 diet DVT Prophylaxis: Subcutaneous Lovenox   Advance goals of care discussion: DNR/DNI-Limited  Family Communication: family was not present at bedside, at the time of rounding. Did not seek caregiver at bedside Previous attending called patient's daughter multiple times and left a voice message, unsuccessful to reach family 2/26 d/w patient's son [name], [name] [phone] 2/27 discussed with patient's son over the phone, agreed to discontinue NG tube feeding and agreed with the discharge planning to long-term  facility   Disposition:  Pt is from SNF [facility]term [facility], admitted with sepsis secondary to UTI, altered mental status. S/p NG tube feeding d/c'd on 2/27, still has mittens,  which precludes a safe discharge. Discharge to SNF, when bed will be available. 2/27, patient's son agreed with the discharge planning.  Informed to TOC. Patient is to be off mittens. 2/28 medically stable to discharge, informed to [facility] to check on long-term facility placement.   Subjective: No significant events overnight, today patient is awake and alert, AO x 1, able to tell me his name and date of birth.  Patient is not able to follow any other commands, Sponseller moving his right hand, seems to having weakness in the left hand.  Unable to check lower extremities.  Physical Exam: General: NAD, lying comfortably in the bed Eyes: PERRLA ENT: Oral Mucosa dry Neck: no JVD,  Cardiovascular: S1 and S2 Present, no Murmur,  Respiratory: good respiratory effort, Bilateral Air entry equal and Decreased, no Crackles, no wheezes Abdomen: Bowel Sound present, Soft and no tenderness,  Skin: no rashes Extremities: no Pedal edema, no calf tenderness Neurologic: CN grossly intact, moving right arm spontaneously, left arm looks weaker, unable to follow commands, could not check lower extremities.  Gait not checked due to patient safety concerns  Vitals:   08/24/23 1732 08/24/23 2226 08/25/23 0500 08/25/23 0841  BP: 114/66 123/76  (!) 158/78  Pulse: 89 91  79  Resp: 17 18  17   Temp: 98 F (  36.7 C) (!) 97.5 F (36.4 C)  98 F (36.7 C)  TempSrc:  Oral    SpO2: 95% 94%  94%  Weight:   68.8 kg   Height:        Intake/Output Summary (Last 24 hours) at 08/25/2023 1359 Last data filed at 08/25/2023 0500 Gross per 24 hour  Intake --  Output 600 ml  Net -600 ml   Filed Weights   08/23/23 0151 08/24/23 0415 08/25/23 0500  Weight: 70.7 kg 70.5 kg 68.8 kg    Data Reviewed: I have personally reviewed  and interpreted daily labs, tele strips, imagings as discussed above. I reviewed all nursing notes, pharmacy notes, vitals, pertinent old records I have discussed plan of care as described above with RN and patient/family.  CBC: Recent Labs  Lab 08/23/23 0519 08/24/23 0430 08/25/23 0426  WBC 12.9* 12.5* 13.2*  NEUTROABS 10.7*  --   --   HGB 12.9* 13.0 13.3  HCT 39.1 39.7 40.3  MCV 89.1 90.2 89.0  PLT 350 354 403*   Basic Metabolic Panel: Recent Labs  Lab 08/19/23 0403 08/20/23 0456 08/21/23 0842 08/23/23 0519 08/24/23 0430 08/25/23 0426  NA 140  --  143 138 139 139  K 3.6  --  4.0 4.0 3.9 3.7  CL 108  --  111 107 106 107  CO2 24  --  26 25 24 25   GLUCOSE 138*  --  143* 110* 105* 97  BUN 14  --  15 18 17 19   CREATININE 0.39* 0.33* 0.30* 0.38* 0.39* 0.42*  CALCIUM 7.7*  --  8.0* 8.2* 8.2* 8.4*  MG  --   --   --  2.1 2.4 2.3  PHOS  --   --   --  3.3 3.2 3.8    Studies: No results found.  Scheduled Meds:  vitamin C  500 mg Oral BID   aspirin  81 mg Oral Daily   bisacodyl  10 mg Rectal QHS   carbidopa-levodopa  2 tablet Oral TID   cyanocobalamin  1,000 mcg Oral Daily   doxazosin  1 mg Oral Daily   enoxaparin (LOVENOX) injection  40 mg Subcutaneous Q24H   escitalopram  5 mg Oral Daily   feeding supplement (PROSource TF20)  60 mL Per Tube Daily   free water  30 mL Per Tube Q4H   Gerhardt's butt cream   Topical BID   latanoprost  1 drop Both Eyes QHS   multivitamin with minerals  1 tablet Oral Daily   mouth rinse  15 mL Mouth Rinse 4 times per day   polyethylene glycol  17 g Oral BID   Continuous Infusions:  feeding supplement (OSMOLITE 1.5 CAL) Stopped (08/24/23 0315)   PRN Meds: acetaminophen **OR** acetaminophen, bisacodyl, hydrALAZINE, mouth rinse, polyvinyl alcohol  Time spent: 35 minutes  Author: Gillis Santa. MD [facility] 08/25/2023 1:59 PM  To reach On-call, see care teams to locate the attending and reach out to them via [email or web address]. If  7PM-7AM, please contact night-coverage If you still have difficulty reaching the attending provider, please page the [facility] (Director on Call) for [facility] on amion for assistance. \

## 2023-08-25 NOTE — Progress Notes (Addendum)
 Palliative Care Progress Note, Assessment & Plan   Patient Name: Justin Bird       Date: 08/25/2023 DOB: 01-Sep-1940  Age: 83 y.o. MRN#: [id number] Attending Physician: Gillis Santa, MD Primary Care Physician: Pcp, No Admit Date: 08/12/2023  Subjective: Patient is sitting up in bed in no apparent distress.  NG is out of his nose and mittens are off.  His friend/caregiver [name] is at bedside.  Patient acknowledges my presence and tracks me with his eyes.  He is more interactive today, answering appropriately yes/no and moaning at appropriate moments.  No family at bedside during my visit.  HPI: 83 y.o. male  with past medical history of depression, Parkinson's tremors, and hard of hearing (cochlear implant) admitted on 08/12/2023 with AMS, fever, and hypoxemia.   Patient is being treated for sepsis secondary to UTI (5-day course of Levaquin), acute metabolic encephalopathy with concern for possible CVA (Seroquel stopped, Sinemet restarted via NG) for which neurology has been consulted, conjunctivitis (Polytrim OU 4 times daily), hypernatremia secondary to dehydration (IVF).   PMT was consulted to support patient with goals of care discussions.   Of note, patient and family are familiar to me as I worked with him during patient's previous extended hospitalization.  Summary of counseling/coordination of care: Extensive chart review completed prior to meeting patient including labs, vital signs, imaging, progress notes, orders, and available advanced directive documents from current and previous encounters.   After reviewing the patient's chart and assessing the patient at bedside, I spoke with patient and his caregiver in regards to symptom management and goals of care.   Caregiver [name] shares patient  has been able to eat food.  He ate approximately half of his breakfast.  Discussed importance of maintaining nutrition and hydration to avoid dehydration, future complications and repeat hospitalizations.  Caregiver shares he is eager for patient to return to long-term care facility as he is hopeful that he will continue to show improvements there as well.  Plan remains for patient to discharge to LTC with outpatient palliative services to follow. Referral placed for TOC to offer choice of palliative services.   DNR with limited interventions remains.  No acute palliative needs at this time.  PMT will step back from daily visits and monitor the patient peripherally.  Please reach out to PMT via Amion for on-call providers for any acute palliative needs.    Please re-engage with PMT if goals change, at patient/family's request, or if patient's health deteriorates prior to discharge.  Physical Exam Vitals reviewed.  Constitutional:      General: He is not in acute distress.    Appearance: He is normal weight. He is not ill-appearing.  HENT:     Head: Normocephalic.     Nose: Nose normal.     Mouth/Throat:     Mouth: Mucous membranes are moist.  Eyes:     Pupils: Pupils are equal, round, and reactive to light.  Pulmonary:     Effort: Pulmonary effort is normal.  Abdominal:     Palpations: Abdomen is soft.  Musculoskeletal:     Comments: Generalized weakness  Skin:    General: Skin is warm and dry.  Neurological:  Mental Status: He is alert.  Psychiatric:        Mood and Affect: Mood normal.        Behavior: Behavior normal.             Total Time 25 minutes   Time spent includes: Detailed review of medical records (labs, imaging, vital signs), medically appropriate exam (mental status, respiratory, cardiac, skin), discussed with treatment team, counseling and educating patient, family and staff, documenting clinical information, medication management and coordination of  care.  Samara Deist L. Bonita Quin, DNP, FNP-BC Palliative Medicine Team

## 2023-08-25 NOTE — TOC Transition Note (Signed)
 Transition of Care [facility]) - Discharge Note   Patient Details  Name: Justin Bird MRN: [id number] Date of Birth: 09-Apr-1941  [facility]) CM/SW Contact:  Hetty Ely, RN Phone Number: 08/25/2023, 3:03 PM   Clinical Narrative: To return to [facility], LTC room 303. Nurse to call report to [phone].    Final next level of care: Long Term Acute Care (LTAC) Barriers to Discharge: Barriers Resolved   Patient Goals and CMS Choice   CMS Medicare.gov Compare Post Acute Care list provided to:: Patient Represenative (must comment) (Patient's son [name] [facility])) Choice offered to / list presented to : Adult Children      Discharge Placement              Patient [facility] bed at: [facility] (Room 303) Patient to be transferred to facility by: [facility] Name of family member notified: Son [name] Patient and family notified of of transfer: 08/25/23  Discharge Plan and Services Additional resources added to the After Visit Summary for     Discharge Planning Services: CM Consult Post Acute Care Choice: Nursing Home          DME Arranged: N/A DME Agency: NA       HH Arranged: NA HH Agency: NA        Social Drivers of Health (SDOH) Interventions SDOH Screenings   Food Insecurity: Patient Unable To Answer (08/14/2023)  Housing: Patient Unable To Answer (08/14/2023)  Transportation Needs: Patient Unable To Answer (08/14/2023)  Utilities: Patient Unable To Answer (08/14/2023)  Financial Resource Strain: Patient Declined (01/04/2023)   Received from [facility]  Social Connections: Patient Unable To Answer (08/14/2023)  Tobacco Use: Low Risk  (08/12/2023)     Readmission Risk Interventions     No data to display

## 2023-08-25 NOTE — Progress Notes (Signed)
 Report called to [facility], spoke to nurse [employer]. SBAR follow, questionasked and answered

## 2023-08-25 NOTE — Plan of Care (Signed)
  Problem: Education: Goal: Knowledge of General Education information will improve Description: Including pain rating scale, medication(s)/side effects and non-pharmacologic comfort measures Outcome: Progressing   Problem: Health Behavior/Discharge Planning: Goal: Ability to manage health-related needs will improve Outcome: Progressing   Problem: Activity: Goal: Risk for activity intolerance will decrease Outcome: Progressing   Problem: Nutrition: Goal: Adequate nutrition will be maintained Outcome: Progressing   Problem: Coping: Goal: Level of anxiety will decrease Outcome: Progressing   Problem: Pain Managment: Goal: General experience of comfort will improve and/or be controlled Outcome: Progressing   Problem: Elimination: Goal: Will not experience complications related to bowel motility Outcome: Progressing Goal: Will not experience complications related to urinary retention Outcome: Progressing   Problem: Safety: Goal: Ability to remain free from injury will improve Outcome: Progressing   Problem: Skin Integrity: Goal: Risk for impaired skin integrity will decrease Outcome: Progressing

## 2023-08-25 NOTE — Plan of Care (Signed)
  Problem: Education: Goal: Knowledge of General Education information will improve Description: Including pain rating scale, medication(s)/side effects and non-pharmacologic comfort measures 08/25/2023 1505 by Collene Gobble, RN Outcome: Adequate for Discharge 08/25/2023 1505 by Collene Gobble, RN Outcome: Not Progressing   Problem: Health Behavior/Discharge Planning: Goal: Ability to manage health-related needs will improve 08/25/2023 1505 by Collene Gobble, RN Outcome: Adequate for Discharge 08/25/2023 1505 by Collene Gobble, RN Outcome: Not Progressing   Problem: Clinical Measurements: Goal: Ability to maintain clinical measurements within normal limits will improve 08/25/2023 1505 by Collene Gobble, RN Outcome: Adequate for Discharge 08/25/2023 1505 by Collene Gobble, RN Outcome: Not Progressing Goal: Will remain free from infection 08/25/2023 1505 by Collene Gobble, RN Outcome: Adequate for Discharge 08/25/2023 1505 by Collene Gobble, RN Outcome: Not Progressing Goal: Diagnostic test results will improve 08/25/2023 1505 by Collene Gobble, RN Outcome: Adequate for Discharge 08/25/2023 1505 by Collene Gobble, RN Outcome: Not Progressing Goal: Respiratory complications will improve 08/25/2023 1505 by Collene Gobble, RN Outcome: Adequate for Discharge 08/25/2023 1505 by Collene Gobble, RN Outcome: Not Progressing Goal: Cardiovascular complication will be avoided 08/25/2023 1505 by Collene Gobble, RN Outcome: Adequate for Discharge 08/25/2023 1505 by Collene Gobble, RN Outcome: Not Progressing   Problem: Activity: Goal: Risk for activity intolerance will decrease 08/25/2023 1505 by Collene Gobble, RN Outcome: Adequate for Discharge 08/25/2023 1505 by Collene Gobble, RN Outcome: Not Progressing   Problem: Nutrition: Goal: Adequate nutrition will be maintained 08/25/2023 1505 by Collene Gobble, RN Outcome: Adequate for Discharge 08/25/2023 1505 by Collene Gobble,  RN Outcome: Not Progressing   Problem: Coping: Goal: Level of anxiety will decrease 08/25/2023 1505 by Collene Gobble, RN Outcome: Adequate for Discharge 08/25/2023 1505 by Collene Gobble, RN Outcome: Not Progressing   Problem: Elimination: Goal: Will not experience complications related to bowel motility 08/25/2023 1505 by Collene Gobble, RN Outcome: Adequate for Discharge 08/25/2023 1505 by Collene Gobble, RN Outcome: Not Progressing Goal: Will not experience complications related to urinary retention 08/25/2023 1505 by Collene Gobble, RN Outcome: Adequate for Discharge 08/25/2023 1505 by Collene Gobble, RN Outcome: Not Progressing   Problem: Pain Managment: Goal: General experience of comfort will improve and/or be controlled 08/25/2023 1505 by Collene Gobble, RN Outcome: Adequate for Discharge 08/25/2023 1505 by Collene Gobble, RN Outcome: Not Progressing   Problem: Safety: Goal: Ability to remain free from injury will improve 08/25/2023 1505 by Collene Gobble, RN Outcome: Adequate for Discharge 08/25/2023 1505 by Collene Gobble, RN Outcome: Not Progressing   Problem: Skin Integrity: Goal: Risk for impaired skin integrity will decrease 08/25/2023 1505 by Collene Gobble, RN Outcome: Adequate for Discharge 08/25/2023 1505 by Collene Gobble, RN Outcome: Not Progressing

## 2023-09-30 ENCOUNTER — Emergency Department

## 2023-09-30 ENCOUNTER — Other Ambulatory Visit: Payer: Self-pay

## 2023-09-30 ENCOUNTER — Emergency Department
Admission: EM | Admit: 2023-09-30 | Discharge: 2023-09-30 | Disposition: A | Discharge status: Home / Self Care | Attending: Emergency Medicine | Admitting: Emergency Medicine

## 2023-09-30 DIAGNOSIS — G20A1 Parkinson's disease without dyskinesia, without mention of fluctuations: Secondary | ICD-10-CM | POA: Insufficient documentation

## 2023-09-30 DIAGNOSIS — W19XXXA Unspecified fall, initial encounter: Secondary | ICD-10-CM | POA: Insufficient documentation

## 2023-09-30 DIAGNOSIS — S61012A Laceration without foreign body of left thumb without damage to nail, initial encounter: Secondary | ICD-10-CM | POA: Diagnosis not present

## 2023-09-30 DIAGNOSIS — M549 Dorsalgia, unspecified: Secondary | ICD-10-CM | POA: Insufficient documentation

## 2023-09-30 DIAGNOSIS — S0083XA Contusion of other part of head, initial encounter: Secondary | ICD-10-CM | POA: Insufficient documentation

## 2023-09-30 DIAGNOSIS — F0390 Unspecified dementia without behavioral disturbance: Secondary | ICD-10-CM | POA: Insufficient documentation

## 2023-09-30 DIAGNOSIS — S0990XA Unspecified injury of head, initial encounter: Secondary | ICD-10-CM

## 2023-09-30 DIAGNOSIS — S6992XA Unspecified injury of left wrist, hand and finger(s), initial encounter: Secondary | ICD-10-CM | POA: Diagnosis present

## 2023-09-30 DIAGNOSIS — R296 Repeated falls: Secondary | ICD-10-CM

## 2023-09-30 DIAGNOSIS — S0093XA Contusion of unspecified part of head, initial encounter: Secondary | ICD-10-CM

## 2023-09-30 HISTORY — DX: Cerebral infarction, unspecified: I63.9

## 2023-09-30 HISTORY — DX: Unspecified dementia, unspecified severity, without behavioral disturbance, psychotic disturbance, mood disturbance, and anxiety: F03.90

## 2023-09-30 HISTORY — DX: Urinary tract infection, site not specified: N39.0

## 2023-09-30 HISTORY — DX: Dysphagia, unspecified: R13.10

## 2023-09-30 LAB — CBC WITH DIFFERENTIAL/PLATELET
Abs Immature Granulocytes: 0.04 10*3/uL (ref 0.00–0.07)
Basophils Absolute: 0.1 10*3/uL (ref 0.0–0.1)
Basophils Relative: 1 %
Eosinophils Absolute: 0.2 10*3/uL (ref 0.0–0.5)
Eosinophils Relative: 3 %
HCT: 39.3 % (ref 39.0–52.0)
Hemoglobin: 12.6 g/dL — ABNORMAL LOW (ref 13.0–17.0)
Immature Granulocytes: 1 %
Lymphocytes Relative: 16 %
Lymphs Abs: 1.4 10*3/uL (ref 0.7–4.0)
MCH: 29.8 pg (ref 26.0–34.0)
MCHC: 32.1 g/dL (ref 30.0–36.0)
MCV: 92.9 fL (ref 80.0–100.0)
Monocytes Absolute: 0.6 10*3/uL (ref 0.1–1.0)
Monocytes Relative: 7 %
Neutro Abs: 6.5 10*3/uL (ref 1.7–7.7)
Neutrophils Relative %: 72 %
Platelets: 360 10*3/uL (ref 150–400)
RBC: 4.23 MIL/uL (ref 4.22–5.81)
RDW: 14.1 % (ref 11.5–15.5)
WBC: 8.8 10*3/uL (ref 4.0–10.5)
nRBC: 0 % (ref 0.0–0.2)

## 2023-09-30 LAB — URINALYSIS, ROUTINE W REFLEX MICROSCOPIC
Bilirubin Urine: NEGATIVE
Glucose, UA: NEGATIVE mg/dL
Hgb urine dipstick: NEGATIVE
Ketones, ur: NEGATIVE mg/dL
Nitrite: NEGATIVE
Protein, ur: NEGATIVE mg/dL
Specific Gravity, Urine: 1.017 (ref 1.005–1.030)
Squamous Epithelial / HPF: 0 /HPF (ref 0–5)
pH: 6 (ref 5.0–8.0)

## 2023-09-30 LAB — COMPREHENSIVE METABOLIC PANEL WITH GFR
ALT: 8 U/L (ref 0–44)
AST: 18 U/L (ref 15–41)
Albumin: 3.3 g/dL — ABNORMAL LOW (ref 3.5–5.0)
Alkaline Phosphatase: 58 U/L (ref 38–126)
Anion gap: 7 (ref 5–15)
BUN: 10 mg/dL (ref 8–23)
CO2: 27 mmol/L (ref 22–32)
Calcium: 8.6 mg/dL — ABNORMAL LOW (ref 8.9–10.3)
Chloride: 105 mmol/L (ref 98–111)
Creatinine, Ser: 0.47 mg/dL — ABNORMAL LOW (ref 0.61–1.24)
GFR, Estimated: >60 mL/min (ref >60)
Glucose, Bld: 92 mg/dL (ref 70–99)
Potassium: 3.7 mmol/L (ref 3.5–5.1)
Sodium: 139 mmol/L (ref 135–145)
Total Bilirubin: 0.7 mg/dL (ref 0.0–1.2)
Total Protein: 6.2 g/dL — ABNORMAL LOW (ref 6.5–8.1)

## 2023-09-30 LAB — TROPONIN I (HIGH SENSITIVITY): Troponin I (High Sensitivity): 4 ng/L (ref <18)

## 2023-09-30 MED ORDER — FENTANYL CITRATE PF 50 MCG/ML IJ SOSY
25.0000 ug | PREFILLED_SYRINGE | Freq: Once | INTRAMUSCULAR | Status: AC
  Administered 2023-09-30: 25 ug via INTRAVENOUS
  Filled 2023-09-30: qty 1

## 2023-09-30 NOTE — ED Notes (Signed)
 Patient is resting on stretcher in a trendelenburg position. Mitts remain in place.

## 2023-09-30 NOTE — ED Notes (Signed)
 External catheter placed per NP.

## 2023-09-30 NOTE — ED Notes (Signed)
 Sonja RN at bedside helping pt to eat applesauce.

## 2023-09-30 NOTE — ED Notes (Signed)
 Patient was cleaned, changed into a new brief and placed in a gown. Pants were put in a bag and sent back with the patient.

## 2023-09-30 NOTE — ED Notes (Signed)
 Patient was assisted to eat applesauce and drink water. No coughing or choking afterwards.

## 2023-09-30 NOTE — ED Provider Notes (Signed)
 [facility] Provider Note    Event Date/Time   First MD Initiated Contact with Patient 09/30/23 [phone]     (approximate)   History   Fall   HPI  Justin Bird is a 83 y.o. male from [place] with past medical history of Parkinson's disease, dementia, CVA, metabolic encephalopathy and as listed in EMR presents to the emergency department for treatment and evaluation after unwitnessed fall.  He is at his cognitive baseline, which is confused and combative.  He is also indicating that he has pain in his back but unable to verbalize location.     Physical Exam   Triage Vital Signs: ED Triage Vitals [09/30/23 0853]  Encounter Vitals Group     BP      Systolic BP Percentile      Diastolic BP Percentile      Pulse      Resp      Temp      Temp src      SpO2      Weight 145 lb (65.8 kg)     Height 5\' 4"  (1.626 m)     Head Circumference      Peak Flow      Pain Score      Pain Loc      Pain Education      Exclude from Growth Chart     Most recent vital signs: Vitals:   09/30/23 1200 09/30/23 1215  BP:    Pulse: 97 95  Resp:    Temp:    SpO2: 97% 96%    General: Awake, no distress. Disoriented to place and time. CV:  Good peripheral perfusion.  Resp:  Normal effort. Breath sounds clear Abd:  No distention. Soft.  Other:  No change in expression or obvious pain with flexion and extension of bilateral lower extremities.  No pain with palpation over hips or lower extremities.  No expressed pain with palpation along the length of the spine.  Hematoma noted to the right forehead.  Skin tear laceration noted to the left thumb.   ED Results / Procedures / Treatments   Labs (all labs ordered are listed, but only abnormal results are displayed) Labs Reviewed  CBC WITH DIFFERENTIAL/PLATELET - Abnormal; Notable for the following components:      Result Value   Hemoglobin 12.6 (*)    All other components within normal limits   URINALYSIS, ROUTINE W REFLEX MICROSCOPIC - Abnormal; Notable for the following components:   Color, Urine YELLOW (*)    APPearance HAZY (*)    Leukocytes,Ua TRACE (*)    Bacteria, UA MANY (*)    All other components within normal limits  COMPREHENSIVE METABOLIC PANEL WITH GFR - Abnormal; Notable for the following components:   Creatinine, Ser 0.47 (*)    Calcium 8.6 (*)    Total Protein 6.2 (*)    Albumin 3.3 (*)    All other components within normal limits  URINE CULTURE  TROPONIN I (HIGH SENSITIVITY)     EKG  NSR rate of 82   RADIOLOGY  Image and radiology report reviewed and interpreted by me. Radiology report consistent with the same.  CT of the head and cervical spine are both negative for acute concerns.  X-rays of the thoracic spine, lumbar spine, and pelvis are all negative for acute bony abnormality.  PROCEDURES:  Critical Care performed: No  Procedures   MEDICATIONS ORDERED IN ED:  Medications  fentaNYL (SUBLIMAZE) injection  25 mcg (25 mcg Intravenous Given 09/30/23 0943)     IMPRESSION / MDM / ASSESSMENT AND PLAN / ED COURSE   I have reviewed the triage note.  Differential diagnosis includes, but is not limited to, ICH, hematoma, cervical vertebral injury, thoracic vertebral injury, lumbar vertebral injury, pelvic fracture  Patient's presentation is most consistent with acute presentation with potential threat to life or bodily function.  Patient unable to lie on x-ray table and not following commands.  He will be medicated with 25 mcg of fentanyl to hopefully help with pain and to obtain imaging.  Mitts ordered to protect IV from being dislodged.  Imaging obtained.  CT of the head and cervical spine are negative for acute concerns.  X-ray of the lumbar spine, pelvis, and thoracic spine negative for acute concerns.  Labs including troponin are essentially normal.  EKG is without acute concerns.  Urinalysis is pending.  Urinalysis shows trace  leukocytes, 6-10 white blood cells, many bacteria.  Urine culture requested.  Patient cleared for discharge back to his facility.      FINAL CLINICAL IMPRESSION(S) / ED DIAGNOSES   Final diagnoses:  Unwitnessed fall  Traumatic hematoma of head, initial encounter  Minor head injury, initial encounter     Rx / DC Orders   ED Discharge Orders     None        Note:  This document was prepared using Dragon voice recognition software and may include unintentional dictation errors.   Chinita Pester, FNP 09/30/23 1352    Minna Antis, MD 09/30/23 1446

## 2023-09-30 NOTE — ED Notes (Signed)
 X-ray tech states patient couldn't tolerate laying flat and was agitated. Cari Beth Triplett aware.

## 2023-09-30 NOTE — ED Notes (Signed)
 Patient taken to imaging. This Clinical research associate is remaining with the patient while in imaging.

## 2023-09-30 NOTE — ED Notes (Signed)
 Patient is back from imaging. Patient remained mostly calm and cooperative. Safety mitts had been placed prior to IV insertion and patient has mostly tolerated it well. Patient c/o back pain on both CT and x-ray tables and pillows were used when appropriate.

## 2023-09-30 NOTE — ED Notes (Signed)
 Patient is alert, but drowsy. Patient remains on the pulse ox. Imaging has been called. Head of the bed has been lowered and patient tolerated it well.

## 2023-09-30 NOTE — ED Triage Notes (Signed)
 To ED from [facility] for unwitnessed fall. At baseline pt is confused, disoriented and combative per EMS. Pt not on thinners, has hematoma to R forehead and small skin tear to L hand. Pt does not follow commands or respond to questions appropriately. hH stroke with "unknown" deficits. Per staff at [facility], pt more confused and talking less than usual. pt has cochlear implant. Paperwork from [facility] says pt has severe dementia with aggressive behavior and dysphagia.   EMS VS: 144/71, 89 HR, 94% RA. No CBG.  Pt very confused and attempting to remove VS equipment. Provider at bedside.

## 2023-09-30 NOTE — ED Notes (Signed)
 [facility] called and said they would be here for patient around 1800 today.

## 2023-09-30 NOTE — Discharge Instructions (Signed)
 CT of the head and cervical spine are normal.  Imaging of the thoracic and lumbar vertebrae are also normal.  Labs are reassuring.  Urinalysis culture has been requested.

## 2023-10-03 LAB — URINE CULTURE: Culture: 60000 — AB

## 2023-12-26 DEATH — deceased
